# Patient Record
Sex: Female | Born: 1935
Health system: Southern US, Community
[De-identification: ages and names within clinical notes are randomized; demographics above are authoritative.]

## PROBLEM LIST (undated history)

## (undated) DIAGNOSIS — E785 Hyperlipidemia, unspecified: Secondary | ICD-10-CM

## (undated) DIAGNOSIS — K5732 Diverticulitis of large intestine without perforation or abscess without bleeding: Secondary | ICD-10-CM

## (undated) DIAGNOSIS — M109 Gout, unspecified: Secondary | ICD-10-CM

## (undated) DIAGNOSIS — I4891 Unspecified atrial fibrillation: Secondary | ICD-10-CM

## (undated) DIAGNOSIS — K219 Gastro-esophageal reflux disease without esophagitis: Secondary | ICD-10-CM

## (undated) DIAGNOSIS — K625 Hemorrhage of anus and rectum: Secondary | ICD-10-CM

## (undated) DIAGNOSIS — I2699 Other pulmonary embolism without acute cor pulmonale: Secondary | ICD-10-CM

## (undated) DIAGNOSIS — C539 Malignant neoplasm of cervix uteri, unspecified: Secondary | ICD-10-CM

## (undated) DIAGNOSIS — I10 Essential (primary) hypertension: Secondary | ICD-10-CM

## (undated) HISTORY — PX: ABDOMINAL HYSTERECTOMY: SHX81

## (undated) HISTORY — PX: BACK SURGERY: SHX140

---

## 1997-12-26 ENCOUNTER — Encounter: Payer: Self-pay | Admitting: Cardiology

## 1997-12-26 ENCOUNTER — Inpatient Hospital Stay (HOSPITAL_COMMUNITY): Admission: EM | Admit: 1997-12-26 | Discharge: 1997-12-28 | Payer: Self-pay | Admitting: Emergency Medicine

## 2000-08-06 ENCOUNTER — Ambulatory Visit (HOSPITAL_COMMUNITY): Admission: RE | Admit: 2000-08-06 | Discharge: 2000-08-06 | Payer: Self-pay | Admitting: Family Medicine

## 2000-08-06 ENCOUNTER — Encounter: Payer: Self-pay | Admitting: Family Medicine

## 2000-09-29 ENCOUNTER — Ambulatory Visit: Admission: RE | Admit: 2000-09-29 | Discharge: 2000-09-29 | Payer: Self-pay | Admitting: Family Medicine

## 2000-10-08 ENCOUNTER — Ambulatory Visit (HOSPITAL_COMMUNITY): Admission: RE | Admit: 2000-10-08 | Discharge: 2000-10-08 | Payer: Self-pay | Admitting: Family Medicine

## 2000-10-08 ENCOUNTER — Encounter: Payer: Self-pay | Admitting: Family Medicine

## 2000-10-29 ENCOUNTER — Encounter: Admission: RE | Admit: 2000-10-29 | Discharge: 2001-01-27 | Payer: Self-pay | Admitting: Internal Medicine

## 2000-10-29 ENCOUNTER — Encounter (HOSPITAL_BASED_OUTPATIENT_CLINIC_OR_DEPARTMENT_OTHER): Payer: Self-pay | Admitting: Internal Medicine

## 2000-12-07 ENCOUNTER — Ambulatory Visit (HOSPITAL_COMMUNITY): Admission: RE | Admit: 2000-12-07 | Discharge: 2000-12-07 | Payer: Self-pay | Admitting: Gastroenterology

## 2000-12-07 ENCOUNTER — Encounter (INDEPENDENT_AMBULATORY_CARE_PROVIDER_SITE_OTHER): Payer: Self-pay | Admitting: Specialist

## 2003-02-01 ENCOUNTER — Encounter: Admission: RE | Admit: 2003-02-01 | Discharge: 2003-02-01 | Payer: Self-pay | Admitting: Internal Medicine

## 2003-05-15 ENCOUNTER — Emergency Department (HOSPITAL_COMMUNITY): Admission: EM | Admit: 2003-05-15 | Discharge: 2003-05-15 | Payer: Self-pay | Admitting: Emergency Medicine

## 2003-12-07 ENCOUNTER — Emergency Department (HOSPITAL_COMMUNITY): Admission: EM | Admit: 2003-12-07 | Discharge: 2003-12-07 | Payer: Self-pay | Admitting: Emergency Medicine

## 2004-08-29 ENCOUNTER — Encounter: Admission: RE | Admit: 2004-08-29 | Discharge: 2004-08-29 | Payer: Self-pay | Admitting: Internal Medicine

## 2005-01-06 ENCOUNTER — Ambulatory Visit (HOSPITAL_COMMUNITY): Admission: RE | Admit: 2005-01-06 | Discharge: 2005-01-06 | Payer: Self-pay | Admitting: Gastroenterology

## 2005-01-06 ENCOUNTER — Encounter: Admission: RE | Admit: 2005-01-06 | Discharge: 2005-01-06 | Payer: Self-pay | Admitting: Gastroenterology

## 2006-08-22 ENCOUNTER — Emergency Department (HOSPITAL_COMMUNITY): Admission: EM | Admit: 2006-08-22 | Discharge: 2006-08-23 | Payer: Self-pay | Admitting: Emergency Medicine

## 2007-02-20 ENCOUNTER — Emergency Department (HOSPITAL_COMMUNITY): Admission: EM | Admit: 2007-02-20 | Discharge: 2007-02-20 | Payer: Self-pay | Admitting: Emergency Medicine

## 2007-10-11 ENCOUNTER — Encounter: Admission: RE | Admit: 2007-10-11 | Discharge: 2007-10-11 | Payer: Self-pay | Admitting: Gastroenterology

## 2008-09-26 ENCOUNTER — Inpatient Hospital Stay (HOSPITAL_COMMUNITY): Admission: EM | Admit: 2008-09-26 | Discharge: 2008-09-27 | Payer: Self-pay | Admitting: Emergency Medicine

## 2009-10-28 ENCOUNTER — Inpatient Hospital Stay (HOSPITAL_COMMUNITY): Admission: EM | Admit: 2009-10-28 | Discharge: 2009-10-31 | Payer: Self-pay | Admitting: Emergency Medicine

## 2010-01-21 ENCOUNTER — Encounter: Admission: RE | Admit: 2010-01-21 | Discharge: 2010-01-21 | Payer: Self-pay | Admitting: Gastroenterology

## 2010-05-09 LAB — HEMOGLOBIN AND HEMATOCRIT, BLOOD
HCT: 31.9 % — ABNORMAL LOW (ref 36.0–46.0)
HCT: 34.4 % — ABNORMAL LOW (ref 36.0–46.0)
HCT: 35.2 % — ABNORMAL LOW (ref 36.0–46.0)
Hemoglobin: 11 g/dL — ABNORMAL LOW (ref 12.0–15.0)
Hemoglobin: 11.7 g/dL — ABNORMAL LOW (ref 12.0–15.0)
Hemoglobin: 11.9 g/dL — ABNORMAL LOW (ref 12.0–15.0)

## 2010-05-09 LAB — DIFFERENTIAL
Basophils Absolute: 0 10*3/uL (ref 0.0–0.1)
Basophils Absolute: 0 10*3/uL (ref 0.0–0.1)
Basophils Relative: 0 % (ref 0–1)
Eosinophils Absolute: 0.1 10*3/uL (ref 0.0–0.7)
Lymphocytes Relative: 22 % (ref 12–46)
Lymphs Abs: 0.9 10*3/uL (ref 0.7–4.0)
Neutro Abs: 1.8 10*3/uL (ref 1.7–7.7)
Neutrophils Relative %: 61 % (ref 43–77)

## 2010-05-09 LAB — URINALYSIS, ROUTINE W REFLEX MICROSCOPIC
Bilirubin Urine: NEGATIVE
Hgb urine dipstick: NEGATIVE
Nitrite: NEGATIVE
Specific Gravity, Urine: 1.014 (ref 1.005–1.030)
pH: 6 (ref 5.0–8.0)

## 2010-05-09 LAB — CROSSMATCH

## 2010-05-09 LAB — BASIC METABOLIC PANEL
BUN: 15 mg/dL (ref 6–23)
BUN: 4 mg/dL — ABNORMAL LOW (ref 6–23)
Chloride: 110 mEq/L (ref 96–112)
Chloride: 111 mEq/L (ref 96–112)
Creatinine, Ser: 0.87 mg/dL (ref 0.4–1.2)
Creatinine, Ser: 0.88 mg/dL (ref 0.4–1.2)
GFR calc non Af Amer: >60 mL/min (ref >60)
GFR calc non Af Amer: >60 mL/min (ref >60)
Glucose, Bld: 86 mg/dL (ref 70–99)
Glucose, Bld: 88 mg/dL (ref 70–99)
Potassium: 3.5 mEq/L (ref 3.5–5.1)
Potassium: 4 mEq/L (ref 3.5–5.1)
Potassium: 4 mEq/L (ref 3.5–5.1)
Sodium: 142 mEq/L (ref 135–145)

## 2010-05-09 LAB — CBC
HCT: 23.4 % — ABNORMAL LOW (ref 36.0–46.0)
HCT: 29 % — ABNORMAL LOW (ref 36.0–46.0)
HCT: 32.5 % — ABNORMAL LOW (ref 36.0–46.0)
Hemoglobin: 8 g/dL — ABNORMAL LOW (ref 12.0–15.0)
Hemoglobin: 9.6 g/dL — ABNORMAL LOW (ref 12.0–15.0)
MCH: 31.1 pg (ref 26.0–34.0)
MCHC: 33.1 g/dL (ref 30.0–36.0)
MCHC: 34.4 g/dL (ref 30.0–36.0)
MCV: 90.5 fL (ref 78.0–100.0)
Platelets: 117 10*3/uL — ABNORMAL LOW (ref 150–400)
Platelets: 126 10*3/uL — ABNORMAL LOW (ref 150–400)
RBC: 2.58 MIL/uL — ABNORMAL LOW (ref 3.87–5.11)
RDW: 16 % — ABNORMAL HIGH (ref 11.5–15.5)
RDW: 16.2 % — ABNORMAL HIGH (ref 11.5–15.5)
WBC: 2.5 10*3/uL — ABNORMAL LOW (ref 4.0–10.5)
WBC: 3 10*3/uL — ABNORMAL LOW (ref 4.0–10.5)

## 2010-05-09 LAB — PROTIME-INR
INR: 0.99 (ref 0.00–1.49)
Prothrombin Time: 13.3 seconds (ref 11.6–15.2)

## 2010-05-09 LAB — COMPREHENSIVE METABOLIC PANEL
ALT: 13 U/L (ref 0–35)
Alkaline Phosphatase: 33 U/L — ABNORMAL LOW (ref 39–117)
CO2: 24 mEq/L (ref 19–32)
Calcium: 9.5 mg/dL (ref 8.4–10.5)
Chloride: 114 mEq/L — ABNORMAL HIGH (ref 96–112)
GFR calc non Af Amer: 50 mL/min — ABNORMAL LOW (ref >60)
Glucose, Bld: 93 mg/dL (ref 70–99)
Sodium: 141 mEq/L (ref 135–145)
Total Bilirubin: 0.7 mg/dL (ref 0.3–1.2)

## 2010-05-09 LAB — PREPARE FRESH FROZEN PLASMA

## 2010-05-09 LAB — URINE CULTURE

## 2010-05-09 LAB — ABO/RH: ABO/RH(D): O POS

## 2010-05-09 LAB — RETICULOCYTES
RBC.: 2.88 MIL/uL — ABNORMAL LOW (ref 3.87–5.11)
Retic Count, Absolute: 51.8 10*3/uL (ref 19.0–186.0)
Retic Ct Pct: 1.8 % (ref 0.4–3.1)

## 2010-05-09 LAB — FOLATE: Folate: >20 ng/mL

## 2010-05-09 LAB — URINE MICROSCOPIC-ADD ON

## 2010-05-09 LAB — IRON AND TIBC
Iron: 41 ug/dL — ABNORMAL LOW (ref 42–135)
Saturation Ratios: 13 % — ABNORMAL LOW (ref 20–55)
TIBC: 308 ug/dL (ref 250–470)
UIBC: 267 ug/dL

## 2010-05-09 LAB — FERRITIN: Ferritin: 46 ng/mL (ref 10–291)

## 2010-06-01 LAB — COMPREHENSIVE METABOLIC PANEL
BUN: 10 mg/dL (ref 6–23)
CO2: 25 mEq/L (ref 19–32)
Calcium: 9 mg/dL (ref 8.4–10.5)
Creatinine, Ser: 0.81 mg/dL (ref 0.4–1.2)
GFR calc non Af Amer: >60 mL/min (ref >60)
Glucose, Bld: 90 mg/dL (ref 70–99)
Total Protein: 5.6 g/dL — ABNORMAL LOW (ref 6.0–8.3)

## 2010-06-01 LAB — DIFFERENTIAL
Eosinophils Relative: 3 % (ref 0–5)
Lymphocytes Relative: 21 % (ref 12–46)
Lymphs Abs: 0.8 10*3/uL (ref 0.7–4.0)
Monocytes Absolute: 0.3 10*3/uL (ref 0.1–1.0)

## 2010-06-01 LAB — CBC
HCT: 38.7 % (ref 36.0–46.0)
Hemoglobin: 12.8 g/dL (ref 12.0–15.0)
Hemoglobin: 12.9 g/dL (ref 12.0–15.0)
MCHC: 33.3 g/dL (ref 30.0–36.0)
MCV: 90.8 fL (ref 78.0–100.0)
RBC: 4.22 MIL/uL (ref 3.87–5.11)
RBC: 4.28 MIL/uL (ref 3.87–5.11)
RDW: 15.5 % (ref 11.5–15.5)
WBC: 3.7 10*3/uL — ABNORMAL LOW (ref 4.0–10.5)

## 2010-06-01 LAB — CARDIAC PANEL(CRET KIN+CKTOT+MB+TROPI)
CK, MB: 1.3 ng/mL (ref 0.3–4.0)
CK, MB: 1.3 ng/mL (ref 0.3–4.0)
Relative Index: INVALID (ref 0.0–2.5)
Total CK: 67 U/L (ref 7–177)
Total CK: 85 U/L (ref 7–177)
Troponin I: 0.01 ng/mL (ref 0.00–0.06)
Troponin I: 0.01 ng/mL (ref 0.00–0.06)
Troponin I: 0.01 ng/mL (ref 0.00–0.06)

## 2010-06-01 LAB — URINALYSIS, ROUTINE W REFLEX MICROSCOPIC
Glucose, UA: NEGATIVE mg/dL
Ketones, ur: NEGATIVE mg/dL
Leukocytes, UA: NEGATIVE
Protein, ur: NEGATIVE mg/dL
Urobilinogen, UA: 0.2 mg/dL (ref 0.0–1.0)

## 2010-06-01 LAB — LIPID PANEL
LDL Cholesterol: 104 mg/dL — ABNORMAL HIGH (ref 0–99)
Total CHOL/HDL Ratio: 3.2 RATIO
VLDL: 14 mg/dL (ref 0–40)

## 2010-06-01 LAB — POCT I-STAT, CHEM 8
BUN: 16 mg/dL (ref 6–23)
Chloride: 110 mEq/L (ref 96–112)
Sodium: 142 mEq/L (ref 135–145)
TCO2: 22 mmol/L (ref 0–100)

## 2010-06-01 LAB — BASIC METABOLIC PANEL
CO2: 26 mEq/L (ref 19–32)
Calcium: 9.4 mg/dL (ref 8.4–10.5)
Chloride: 110 mEq/L (ref 96–112)
GFR calc Af Amer: >60 mL/min (ref >60)
Glucose, Bld: 90 mg/dL (ref 70–99)
Sodium: 142 mEq/L (ref 135–145)

## 2010-06-01 LAB — HEMOGLOBIN A1C
Hgb A1c MFr Bld: 5.4 % (ref 4.6–6.1)
Mean Plasma Glucose: 108 mg/dL

## 2010-06-01 LAB — PROTIME-INR
INR: 1.6 — ABNORMAL HIGH (ref 0.00–1.49)
INR: 1.6 — ABNORMAL HIGH (ref 0.00–1.49)
Prothrombin Time: 18.9 seconds — ABNORMAL HIGH (ref 11.6–15.2)
Prothrombin Time: 19.6 seconds — ABNORMAL HIGH (ref 11.6–15.2)
Prothrombin Time: 19.8 seconds — ABNORMAL HIGH (ref 11.6–15.2)

## 2010-06-01 LAB — POCT CARDIAC MARKERS
Myoglobin, poc: 49.5 ng/mL (ref 12–200)
Troponin i, poc: <0.05 ng/mL (ref 0.00–0.09)

## 2010-06-01 LAB — URINE CULTURE

## 2010-06-01 LAB — URINE MICROSCOPIC-ADD ON

## 2010-06-01 LAB — MAGNESIUM: Magnesium: 2.1 mg/dL (ref 1.5–2.5)

## 2010-07-09 NOTE — Cardiovascular Report (Signed)
[name], [name]                ACCOUNT NO.:  [id number]   MEDICAL RECORD NO.:  [id number]          PATIENT TYPE:  INP   LOCATION:  3733                         FACILITY:  [facility]   PHYSICIAN:  Richard A. Alanda Amass, M.D.DATE OF BIRTH:  [birth year]   DATE OF PROCEDURE:  09/26/2008  DATE OF DISCHARGE:                            CARDIAC CATHETERIZATION   PROCEDURES:  Retrograde central aortic catheterization; selective  coronary angiography; pre and post IC nitroglycerin administration; LV  angiogram, RAO and LAO projection; abdominal aortic angiogram, midstream  PA projection; successful StarClose right common femoral artery closure  device deployed.   BRIEF HISTORY:  [name] is a 77 year old Afro American widowed  mother of 4 with 62 GC, 1 GGC retired from [employer].  Chronic cigarette  abuse, currently a half-a-pack per day.  History of sick sinus syndrome  with PAF, treated with beta-blockers and chronic Coumadin therapy under  the previous care of Dr. Chanda Busing.  Negative Cardiolite for  ischemia in 2008.  History of intermittent chest pain in the past and  remote normal coronary arteries over 10 years ago by Dr. Deborah Chalk.  Admitted to the hospital in the a.m. of September 26, 2008 for lower  substernal chest pain, pressure-like with radiation to the left  shoulder.  No nausea, vomiting, or diaphoresis.  No significant reflux  symptoms.  Myocardial infarction ruled out by serial enzymes and EKGs.  The patient has had PAF in the hospital.  Coumadin was not taken by the  patient yesterday and it was held today and INR was 1.6.  Informed  consent was obtained to proceed with diagnostic coronary angiography in  this setting.  There are associated history of hyperlipidemia not  treated, systemic hypertension, and GERD, remote history of  diverticulitis, irregular BM.   The patient was brought to the second floor CP Lab in a postabsorptive  state after premedication with 5 mg of  Valium p.o.  Right groin was  prepped, draped in usual manner.  Xylocaine 1% was used for local  anesthesia.  The RCFA was entered with single anterior puncture using an  18 thin-wall needle and using modified Seldinger technique, 5-French  short sidearm sheath was inserted without difficulty.  Diagnostic  coronary angiography was done with 5-French Cordis preformed coronary  and pigtail catheters.  IC nitroglycerin was given at the left coronary  with repeat injections obtained.  LV angiogram was done at 25 mL, 14 mL  per second RAO and 20 mL/12 mL per second LAO projection.  Pullback  pressure of the CA was performed and showed no gradient across the  aortic valve.  Catheter was pulled down above the level of the renal  arteries and in the oblique projection, abdominal aortic angiography was  done because of the patient's known systemic hypertension at 20 mL, 12  mL per second through the pigtail catheter.  Catheter was removed.  Puncture was verified by hand injection through the sidearm sheath  should be in the RCFA.  There were irregularities, but no calcification  seen.  The right common femoral arteriotomy was closed with a  5-6 [place]  StarClose device successfully.  The patient was transferred to the  holding area for postoperative care and observation.   PRESSURES:  LV:  150/0; LVEDP 14 mmHg.   CA:  150/70 mmHg, post IC nitroglycerin.  (Systolic blood pressure was  160 pre IC nitroglycerin and came down to 135 at the end of the  procedure).  There was no gradient across the aortic valve.   Fluoroscopy did not reveal any significant coronary intracardiac or  valvular calcification.   LV angiogram in the RAO and LAO projection showed a vigorous  hyperdynamic LV with near cavity obliteration of the apex.  No segmental  wall motion abnormality.  Mitral valve prolapse was seen  angiographically, but there was no mitral regurgitation present.  The EF  was greater than 70%.   The  main left coronary was normal.   The LAD coursed to the apex and undersurface of the heart and wrapped  around the apex.  There was 30-40% eccentric narrowing irregularity at  the junction of the proximal third at SP2 and DX1.  There was no other  segmental area of irregularity at the junction of the mid and distal  third with 30-40% narrowing or less distal large vessel, however, with  excellent residual lumen and normal flow.   There was a large DX1 from the proximal third af SP1 that trifurcated  and had no significant stenosis.   The circumflex was nondominant, gave off a small normal OM-1, small  normal OM-2.  The AV groove circumflex had segmental 30% narrowing  irregularity, atherosclerotic in nature, but no significant stenosis and  bifurcated distally.  The atrial and PABG branch were normal from the  proximal third of the OCX.   The right coronary was a large dominant vessel.  There are  irregularities throughout the midportion with 20-30% narrowing and  another 30-40% narrowing before the acute margin.  There was bifurcating  large PDA and normal bifurcating PLA and normal RV branches.  Normal  flow.   Abdominal angiogram demonstrated patent SMA and celiac axis and widely  patent single renal artery bilaterally with mildly tortuous infrarenal  abdominal aorta with no aneurysm or stenosis.   DISCUSSION:  The patient's history as outlined above.  Chest pain is  probably noncardiac since she has noncritical coronary artery disease  with atherosclerotic irregularities and mild narrowing as outlined  above.   I would recommend discontinuation of smoking.  Because of her  hyperdynamic LV, I would avoid any nitrite therapy and make sure she is  well hydrated.  She may be a candidate for increase in her beta-blockers  particularly with her history of PAF.  She is also a possible candidate  for antiarrhythmic therapy because of PAF, although she will remain on  chronic  Coumadin with plan to restart Coumadin tonight if the right  groin is stable, postprocedure.  Because of her long-term chronic  Coumadin therapy and catheterization today, I did not feel that we  needed to restart heparin across or over, but rather restart Coumadin  alone.   The patient will be followed up as an outpatient.   CATHETERIZATION DIAGNOSES:  1. Chest pain, etiology not determined.  2. Sick sinus syndrome, paroxysmal atrial fibrillation on beta-      blocker.  3. Hyperlipidemia.  4. Adult-onset diabetes mellitus.  5. Systemic hypertension, mild normal renal arteries.  6. Hyperdynamic left ventricle with mitral valve prolapse without      mitral regurgitation.  7.  Hyperlipidemia.  8. Cigarette abuse (smoking cessation consult done and discontinuation      consult).      Richard A. Alanda Amass, M.D.  Electronically Signed     RAW/MEDQ  D:  09/26/2008  T:  09/27/2008  Job:  [id number]   cc:   CP Lab  Robyn N. Allyne Gee, M.D.

## 2010-07-12 NOTE — Op Note (Signed)
[name], Sara Butler                ACCOUNT NO.:  [id number]   MEDICAL RECORD NO.:  [id number]          PATIENT TYPE:  AMB   LOCATION:  ENDO                         FACILITY:  [facility]   PHYSICIAN:  Petra Kuba, M.D.    DATE OF BIRTH:  1928-09-20   DATE OF PROCEDURE:  01/06/2005  DATE OF DISCHARGE:                                 OPERATIVE REPORT   PROCEDURE PERFORMED:  Flexible sigmoidoscopy.   ENDOSCOPIST:  Petra Kuba, M.D.   INDICATIONS FOR PROCEDURE:  Bright red blood per rectum.  History of colon  polyps, history of diverticular stricture, diarrhea.  Consent was signed  after the risks and benefits were thoroughly discussed in the office  multiple times.   MEDICINES USED:  Demerol 70 mg, Versed 5 mg.   DESCRIPTION OF PROCEDURE:  Rectal inspection was pertinent for external  hemorrhoids small.  Digital exam was negative.  The upper endoscope was  inserted but unfortunately at this time, we were unable to advance through  the stricture at 20 cm.  She had some trouble holding air and we did try to  compress her rectum.  We also turned her on her back but despite multiple  attempts, could not advance through this strictured area.  We elected to  withdraw. The rectum was normal.  Retroflexion and anorectal pullthrough  revealed some small hemorrhoids.  Air was suctioned, scope removed.  The  patient tolerated the procedure well.  There were no obvious complications.   ENDOSCOPIC DIAGNOSES:  1.  Internal and external hemorrhoids.  2.  Distal sigmoid stricture, unable to advance the upper 140 scope.  We did      not have unfortunately the 160 available to try which is what we were      able to advance based on my records the last time with attempts on both      her left side and her back.   PLAN:  Go ahead and get a barium enema today.  Consider virtual colonoscopy.  Surgical consultation, possibly even a balloon dilatation and retrial, but  will wait on barium enema to decide.   Probably have to discuss her case  further with Dr. Allyne Gee.  Okay to resume Coumadin and wait on the x-ray  report to decide how to proceed.           ______________________________  Petra Kuba, M.D.     MEM/MEDQ  D:  01/06/2005  T:  01/07/2005  Job:  [id number]   cc:   Candyce Churn. Allyne Gee, M.D.  Fax: [phone]

## 2010-07-12 NOTE — Consult Note (Signed)
[facility]  Patient:    DEMITRA, Sara Butler Visit Number: [id number] MRN: [id number]          Service Type: FTC Location: FOOT Attending Physician:  Sharren Bridge Dictated by:   Jonelle Sports Cheryll Cockayne, M.D. Proc. Date: 10/30/00 Admit Date:  10/29/2000                            Consultation Report  HISTORY:  This 77 year old white female is referred by the [facility] physicians for evaluation of painful swelling of the distal left lower extremity.  Apparently, these symptoms began some two months ago and had been pretty persistent since that time.  The patient was unaware of any antecedent illness nor has she had any substantial injury to that extremity of which she is aware.  She has not had any previous episodes quite like this, although she has been prone to degenerative arthritis and most recently has been on Celebrex.  The pains began more in the ankle area and would shoot up into the calf to the knee and just above and had continued on that basis.  They are not continuous pains and not triggered by any specific positioning or activity. She has noted tenderness about the ankle, perhaps particularly on the medial side.  She has had no fever or systemic symptoms.  Her other joints are relatively stable and quiet at the moment.  During her evaluation for this at the [facility], she has had a normal CBC, normal CMET, normal CEA 125, and negative venous Dopplers for thrombophlebitis or Bakers cyst on that side and has had evidence for modest lymph node enlargement of the left groin.  This latter led to a CT of the pelvis, which showed an absence of the uterus with mucosal thickening of the distal ileum and in the left colon, suggestive perhaps of colitis.  In addition, it did confirm one moderately enlarged lymph node at the left groin measuring 19 x 15 mm and one or two smaller nodes that were essentially unremarkable.  With that  background history, the patient is referred here for our evaluation of her ankle and lower extremity pain.  PAST MEDICAL HISTORY:  Chronic reflux esophagitis, positive H. pylori in June 2002, which apparently was treated.  She had endocervical cancer, for which she had a hysterectomy 28 years ago followed by some radiation and chemotherapy but with no recurrence.  She has had a back surgery in 1993. Apparently had cardiac catheterization in 1999 but was told of no coronary disease.  She is hypertensive and takes medication for that and does have degenerative arthritis, as mentioned above.  ALLERGIES:  CODEINE.  REGULAR MEDICATIONS: 1. Atenolol 50 mg daily. 2. Famotidine 20 mg daily. 3. Hydrochlorothiazide 25 mg daily. 4. Quinine 260 mg daily. 5. Nexium 40 mg daily. 6. Fioricet generic 1 every four hours as needed for pain. 7. Celebrex 200 mg 1 daily. 8. Recently had a course of cephalexin 500 mg q.i.d. for seven days without    particular change noted in that left lower extremity.  This was completed    some five or six days ago.  PHYSICAL EXAMINATION:  EXTREMITIES:  Examination today is limited to the lower extremities.  It is noted that both extremities show moderate stasis dermatitis-type changes in the lower pretibial and ankle areas bilaterally but there is no apparent skin breakdown.  All pulses are palpable and generous.  Skin temperatures are normal but  with a 2-degree differential on both the plantar aspect and the dorsum of the left foot compared to the right and this differential extends approximately one-third the way up the calf.  Monofilament testing shows that she has protected sensation throughout her feet.  There are no gross abnormalities in the feet.  No areas of broken skin, to include a careful search of the interdigital area, and no significant callus formation.  There is slight tenderness to palpation over the ankle area and tibiotalar area on the left but  without gross edema.  Careful measurement is taken on the left lower extremity and at 0.10 cm below the tibial plateau circumference is 36.1 cm.  This is 35 at a corresponding area on the right.  An area 10 cm proximal to the medial malleolus, the measurement is 20 cm on the left and 21.8 cm on the right, which shows actually that the unaffected left is slightly larger than the affected leg at this level.  Homans sign is negative.  There is no obvious cellulitis.  The small nodes at the groin cannot easily be palpated and there is no other gross abnormality to that extremity.  The degree of edema, as indicated, is extremely mild and is not pitting in nature.  IMPRESSION:  Probable inflammatory arthritic process at the left ankle with secondary erythema, swelling, and painful symptomatology.  DISPOSITION: 1. With the thought in mind that this could be atypical gout, the patient will    be sent for uric acid determination.  At the same time, sedimentation rate    and ANA will be done. 2. The left ankle will be x-rayed to exclude any subtle pathology beyond    degenerative arthritis. 3. She is to be allowed to continue all of her usual medications. 4. Because of the evidence for venous disease and the inflammation of this    extremity, it is recommended that she add one coated aspirin per day to her    medications. 5. She is given today a double-strength prednisone 10-day Dosepak to be    tapered over the next 10 days. 6. She is advised to limit her physical activity and to be off her feet as    much as possible, during that period of time keeping her leg extended and    preferably by reclining and not with a bend at the groin crease. 7. If the evaluation indicated here is unrevealing and if the patient fails to    improve on the medication given, it is suggested that perhaps a    consultation with the internal medicine clinic would be more appropriate    than a return here. 8.  Incidentally, the patient does have a scheduled appointment with a    gastroenterologist to evaluate the possibility of inflammatory disease of     the distal colon and ileum by a gastroenterologist. 9. Any follow-up here should be on a p.r.n. basis. Dictated by:   Jonelle Sports Cheryll Cockayne, M.D. Attending Physician:  Sharren Bridge DD:  10/29/00 TD:  10/29/00 Job: [id number]

## 2010-07-12 NOTE — Discharge Summary (Signed)
Sara Butler, KURDZIEL NO.:  [id number]   MEDICAL RECORD NO.:  [id number]          PATIENT TYPE:  INP   LOCATION:  3733                         FACILITY:  [facility]   PHYSICIAN:  Sheliah Mends, MD      DATE OF BIRTH:  1928-09-07   DATE OF ADMISSION:  09/26/2008  DATE OF DISCHARGE:  09/27/2008                               DISCHARGE SUMMARY   DISCHARGE DIAGNOSES:  1. Chest pain, noncardiac.      a.     Cardiac catheterization with nonobstructive coronary artery       disease.  2. Paroxysmal atrial fibrillation, initially in atrial fibrillation on      admission, but converted during hospitalization in sinus rhythm to      sinus bradycardia at discharge.  3. Urinary tract infection.  4. Hyperdynamic left ventricular dysfunction.  5. History of back surgery.  6. History of uterine cancer with hysterectomy in 1975 with 1 month of      radiation.  7. History of diverticulitis.   DISCHARGE CONDITION:  Improved.   PROCEDURES:  September 26, 2008, combined left heart catheterization by Dr.  Susa Griffins.   DISCHARGE MEDICATIONS:  1. Atenolol 50 mg has been increased to 75 mg daily, i.e. one and a      half 50-mg tablet daily.  2. Magnesium oxide was stopped.  3. Nitroglycerin sublingual as needed as before.  4. Coumadin 10 mg September 27, 2008, then 5 mg September 28, 2008, and then      Coumadin Clinic will continue dosing.  5. Lidoderm patch 5% to area daily if needed as before.  6. Vitamin D 50,000 units Tuesdays and Fridays as before.  7. Cipro 250 mg one twice a day for 3 days and stop.   DISCHARGE INSTRUCTIONS:  1. Increase activity slowly.  2. May shower.  3. No lifting for 1 week.  4. No driving for 2 days.  5. Low-sodium heart-healthy diet.  6. Wash cath site with soap and water.  7. Call if any bleeding, swelling, or drainage.  8. Follow up with Dr. Alanda Amass on October 16, 2008, at 4:15 p.m.  9. Go to Coumadin Clinic at [facility] on  September 29, 2008, at 12 noon.   HOSPITAL COURSE:  The patient was admitted on September 26, 2008, secondary  to chest pain and shortness of breath.  She was found to be in atrial  fibrillation.  History includes remote cath 10 years ago by Dr. Deborah Chalk.  Other history, Persantine Myoview in 2008 with ischemia, EF 70%; and 2D  echo in June 2006, EF greater than 55% with mild LVH and trace MR.  The  patient was admitted by Dr. Alanda Amass, continued on her Coumadin and her  atenolol was increased to 75 mg daily; and she underwent cardiac  catheterization to evaluate her coronary arteries.  Her INR was 1.7 at  that time.  She did well with cardiac catheterization; and by the next  morning, INR was still 1.6.  Dr. Alanda Amass did not feel she needed to  be  crossed over as she was maintaining sinus rhythm now.  She would receive  10 mg of Coumadin on the day of discharge.  She was found to have UTI  and was placed on Cipro.   Discharge blood pressure 124/76, pulse 56, resp is 16, temperature 98.5,  oxygen saturation on room air was 100%.   HEART:  Regular rate and rhythm.  LUNGS:  Clear to auscultation bilaterally.  ABDOMEN:  Soft and nontender.  EXTREMITIES:  Without edema.   LABORATORY DATA:  Hemoglobin 12.8, hematocrit 38.3, WBC 3.3, platelets  121, slightly decreased.  It was 141 on admission.  Neutrophils 67,  lymphs 21, mono 8, eos 3, baso 1.   Protime was 1.7 on admission.  At discharge, 1.6.   Chemistry:  Sodium 142, potassium 3.6, chloride 110, glucose 115, BUN  16, creatinine 0.81.   Total protein 5.6, albumin 2.9, AST 20, ALT 15, alkaline phos 46, total  bili 0.9, glycohemoglobin 5.4.  Cardiac enzymes were negative with CKs  85-67, MBs 1.3-1.0, and troponin I 0.01 x3.   Total cholesterol 171, triglycerides 68, HDL 53, and LDL 104.  TSH  1.660.  UA was positive for nitrites and was found to have Klebsiella  pneumonia and it was sensitive to Cipro.  The EKG revealed initially   atrial fibrillation, rate control.  No acute ST changes and follow up  September 26, 2008, continued AFib.   Chest x-ray during hospitalization showed no acute disease.  The patient  was seen and examined by Dr. Garen Lah and felt stable for discharge  home.  He will follow up as an outpatient.      Darcella Gasman. Annie Paras, N.P.      Sheliah Mends, MD  Electronically Signed    LRI/MEDQ  D:  10/22/2008  T:  10/23/2008  Job:  [id number]   cc:   Gerlene Burdock A. Alanda Amass, M.D.  Candyce Churn. Allyne Gee, M.D.

## 2010-07-12 NOTE — Procedures (Signed)
[place]  Patient:    Sara, Butler Visit Number: [id number] MRN: [id number]          Service Type: END Location: ENDO Attending Physician:  Nelda Marseille Dictated by:   Petra Kuba, M.D. Proc. Date: 12/07/00 Admit Date:  12/07/2000   CC:         Britta Mccreedy R. Audria Nine, M.D.   Procedure Report  PROCEDURE PERFORMED:  Colonoscopy with polypectomy and biopsy.  ENDOSCOPIST:  Petra Kuba, M.D.  INDICATIONS FOR PROCEDURE:  Abnormal CT scan, multiple GI complaints.  Consent was signed after risks, benefits, methods, and options were thoroughly discussed in the office.  MEDICATIONS USED:  Demerol 70 mg, Versed 7 mg.  DESCRIPTION OF PROCEDURE:  Rectal inspection was pertinent for external hemorrhoids small.  Digital exam was negative.  First the video pediatric colonoscope was inserted but unable to be advanced through the sigmoid.  At the first sigmoid turn seemed to be a stricture.  We did try to roll her on her back but that was not helpful and we elected to withdraw back to the rectum.  The scope was retroflexed revealing some internal hemorrhoids.  The scope was removed and we went ahead and inserted the upper endoscope 160 which was easily able to advance through the strictured area and with minimal abdominal pressure able to be advanced to the cecum.  There were some left distal diverticula but no other abnormalities.  We advanced to the cecum which was identified by the appendiceal orifice and the ileocecal valve.  The scope was inserted  into the terminal ileum which had a slight increase in white nodularity but no obvious inflammation.  Scattered biopsies were obtained as was photodocumentation.  The scope was slowly withdrawn.  Some random cecum, ascending and transverse biopsies were obtained which was put in a second container.  This was done because of inflammation on the CT scan.  The prep was adequate, there was some  liquid stool that required washing and suctioning.  In the midtransverse, two small polyps were seen and one was hot biopsied x 1, the other hot biopsied x 2.  The scope was further withdrawn, no other abnormalities were seen as we withdrew back to the sigmoid and on slow withdrawal through the distal sigmoid confirmed some diverticula in the distal sigmoid stricture.  There was some inflammation and erythema.  Scattered biopsies of the strictured area were obtained and put in the fourth container. The scope was slowly withdrawn back to the rectum.  No additional findings were seen.  We then advanced through the strictured area and suctioned air from the left side of the colon.  Scope was removed.  The patient tolerated the procedure well.  There was no obvious immediate complication.  ENDOSCOPIC DIAGNOSIS: 1. Internal and external small hemorrhoids. 2. Distal sigmoid stricture probably diverticula, unable to advance the    pediatric colonoscope but able to advance the upper endoscope, status post    biopsy of the stricture. 3. Left distal diverticula only. 4. Two small transverse polyps hot biopsied. 5. Otherwise within normal limits to the terminal ileum, except for slight    abnormal mucosa, status post random biopsies of the terminal ileum and    the colon on the right side. PLAN:  Await pathology.  Call p.r.n.  Otherwise follow up in one to two months to recheck symptoms and decide any other work-up and plans. Dictated by:   Petra Kuba, M.D. Attending Physician:  Nelda Marseille DD:  12/07/00 TD:  12/07/00 Job: [id number]

## 2010-11-29 LAB — DIFFERENTIAL
Basophils Absolute: 0
Eosinophils Absolute: 0.1
Eosinophils Relative: 2
Lymphocytes Relative: 19
Lymphs Abs: 0.8
Monocytes Absolute: 0.4

## 2010-11-29 LAB — URINALYSIS, ROUTINE W REFLEX MICROSCOPIC
Bilirubin Urine: NEGATIVE
Glucose, UA: NEGATIVE
Hgb urine dipstick: NEGATIVE
Specific Gravity, Urine: 1.015
Urobilinogen, UA: 0.2
pH: 6

## 2010-11-29 LAB — COMPREHENSIVE METABOLIC PANEL
ALT: 15
AST: 17
Albumin: 3.1 — ABNORMAL LOW
Alkaline Phosphatase: 54
BUN: 6
CO2: 26
Calcium: 9.5
Chloride: 111
Creatinine, Ser: 0.91
GFR calc Af Amer: >60
GFR calc non Af Amer: >60
Glucose, Bld: 97
Potassium: 4.3
Sodium: 141
Total Bilirubin: 0.8
Total Protein: 6

## 2010-11-29 LAB — URINE MICROSCOPIC-ADD ON

## 2010-11-29 LAB — CBC
MCV: 88.2
Platelets: 166
RBC: 4.29
WBC: 4.4

## 2010-12-11 LAB — URINE MICROSCOPIC-ADD ON

## 2010-12-11 LAB — COMPREHENSIVE METABOLIC PANEL
Albumin: 3.4 — ABNORMAL LOW
Alkaline Phosphatase: 51
BUN: 7
CO2: 26
Chloride: 108
Glucose, Bld: 97
Potassium: 4.2
Total Bilirubin: 0.6

## 2010-12-11 LAB — URINALYSIS, ROUTINE W REFLEX MICROSCOPIC
Bilirubin Urine: NEGATIVE
Glucose, UA: NEGATIVE
Ketones, ur: NEGATIVE
pH: 6.5

## 2010-12-11 LAB — DIFFERENTIAL
Basophils Absolute: 0
Basophils Relative: 0
Monocytes Absolute: 0.5
Neutro Abs: 2.9
Neutrophils Relative %: 63

## 2010-12-11 LAB — CBC
HCT: 36.4
Hemoglobin: 11.8 — ABNORMAL LOW
RBC: 4.49
WBC: 4.7

## 2010-12-19 ENCOUNTER — Other Ambulatory Visit: Payer: Self-pay | Admitting: Family Medicine

## 2011-01-10 ENCOUNTER — Emergency Department (HOSPITAL_COMMUNITY)
Admission: EM | Admit: 2011-01-10 | Discharge: 2011-01-10 | Disposition: A | Discharge status: Home / Self Care | Payer: Medicare HMO | Attending: Emergency Medicine | Admitting: Emergency Medicine

## 2011-01-10 ENCOUNTER — Emergency Department (HOSPITAL_COMMUNITY): Payer: Medicare HMO

## 2011-01-10 ENCOUNTER — Encounter: Payer: Self-pay | Admitting: Emergency Medicine

## 2011-01-10 DIAGNOSIS — M899 Disorder of bone, unspecified: Secondary | ICD-10-CM | POA: Insufficient documentation

## 2011-01-10 DIAGNOSIS — I10 Essential (primary) hypertension: Secondary | ICD-10-CM | POA: Insufficient documentation

## 2011-01-10 DIAGNOSIS — Z79899 Other long term (current) drug therapy: Secondary | ICD-10-CM | POA: Insufficient documentation

## 2011-01-10 DIAGNOSIS — M7989 Other specified soft tissue disorders: Secondary | ICD-10-CM | POA: Insufficient documentation

## 2011-01-10 DIAGNOSIS — M25539 Pain in unspecified wrist: Secondary | ICD-10-CM | POA: Insufficient documentation

## 2011-01-10 DIAGNOSIS — M19039 Primary osteoarthritis, unspecified wrist: Secondary | ICD-10-CM | POA: Insufficient documentation

## 2011-01-10 DIAGNOSIS — I4891 Unspecified atrial fibrillation: Secondary | ICD-10-CM | POA: Insufficient documentation

## 2011-01-10 HISTORY — DX: Unspecified atrial fibrillation: I48.91

## 2011-01-10 HISTORY — DX: Diverticulitis of large intestine without perforation or abscess without bleeding: K57.32

## 2011-01-10 HISTORY — DX: Hemorrhage of anus and rectum: K62.5

## 2011-01-10 HISTORY — DX: Essential (primary) hypertension: I10

## 2011-01-10 MED ORDER — PREDNISONE 20 MG PO TABS: 40.0000 mg | ORAL_TABLET | Freq: Every day | ORAL | Status: AC

## 2011-01-10 MED ORDER — HYDROCODONE-ACETAMINOPHEN 5-325 MG PO TABS: 1.0000 | ORAL_TABLET | Freq: Four times a day (QID) | ORAL | Status: AC | PRN

## 2011-01-10 MED ORDER — IBUPROFEN 800 MG PO TABS
800.0000 mg | ORAL_TABLET | Freq: Once | ORAL | Status: AC
  Administered 2011-01-10: 800 mg via ORAL
  Filled 2011-01-10: qty 1

## 2011-01-10 NOTE — Progress Notes (Signed)
77 year old female comes in with several day history of pain and swelling in the left wrist. On exam, there is mild swelling of the wrist with tenderness to palpation. X-rays are negative for fracture. Overall the picture seems most consistent with gout and will be treated accordingly.

## 2011-01-10 NOTE — ED Provider Notes (Signed)
History     CSN: [id number] Arrival date & time: 01/10/2011 10:44 AM   First MD Initiated Contact with Patient 01/10/11 1052      Chief Complaint  Patient presents with  . Wrist Pain    Pt reports 18 hrs of inncreasing l/wrist pain and swelling    (Consider location/radiation/quality/duration/timing/severity/associated sxs/prior treatment) Patient is a 77 y.o. female presenting with wrist pain. The history is provided by the patient.  Wrist Pain This is a new problem. Episode onset: 2 days ago. The problem occurs constantly. The problem has been gradually worsening. Associated symptoms include joint swelling. Pertinent negatives include no abdominal pain, arthralgias, chest pain, chills, coughing, fever, headaches, nausea, neck pain, numbness, rash, vomiting or weakness. Exacerbated by: Movement, palpation. She has tried nothing for the symptoms.   patient reports that she has just finished a seven-day course of Cipro for urinary infection and was prescribed by her regular doctor. It was the second course of Cipro she has had in the last 2 months, and she is concerned that she may be developing tendinitis as a side effect of the medication.  Past Medical History  Diagnosis Date  . Hypertension   . Cancer   . Atrial fibrillation   . Bleeding from anus   . Diverticulitis of colon     Past Surgical History  Procedure Date  . Abdominal hysterectomy   . Back surgery     History reviewed. No pertinent family history.  History  Substance Use Topics  . Smoking status: Never Smoker   . Smokeless tobacco: Not on file  . Alcohol Use: No     Review of Systems  Constitutional: Negative for fever and chills.  HENT: Negative for neck pain and neck stiffness.   Eyes: Negative for pain and visual disturbance.  Respiratory: Negative for cough and chest tightness.   Cardiovascular: Negative for chest pain and palpitations.  Gastrointestinal: Negative for nausea, vomiting and  abdominal pain.  Musculoskeletal: Positive for joint swelling. Negative for back pain, arthralgias and gait problem.  Skin: Positive for color change. Negative for rash and wound.  Neurological: Negative for dizziness, weakness, numbness and headaches.  Hematological: Does not bruise/bleed easily.  Psychiatric/Behavioral: Negative for confusion.    Allergies  Codeine  Home Medications   Current Outpatient Rx  Name Route Sig Dispense Refill  . ACETAMINOPHEN 325 MG PO TABS Oral Take 325 mg by mouth every 6 (six) hours as needed. For headache     . ATENOLOL 50 MG PO TABS Oral Take 50 mg by mouth daily.      Marland Kitchen GLUCOSAMINE-CHONDROITIN 500-400 MG PO TABS Oral Take 1 tablet by mouth 3 (three) times daily. Patient states that she takes only twice a day     . LIDOCAINE 5 % EX PTCH Transdermal Place 1 patch onto the skin daily. Remove & Discard patch within 12 hours or as directed by MD     . OMEPRAZOLE 20 MG PO CPDR Oral Take 20 mg by mouth daily.      Marland Kitchen SIMVASTATIN 20 MG PO TABS Oral Take 20 mg by mouth at bedtime.        BP 188/93  Pulse 73  Temp(Src) 98.9 F (37.2 C) (Oral)  Resp 20  SpO2 100%  Physical Exam  Constitutional: She is oriented to person, place, and time. She appears well-developed and well-nourished.  HENT:  Head: Normocephalic and atraumatic.  Eyes: Pupils are equal, round, and reactive to light.  Neck: Normal range of  motion. Neck supple.  Cardiovascular: Normal rate and regular rhythm.   Pulmonary/Chest: Effort normal and breath sounds normal.  Abdominal: Soft. She exhibits no distension. There is no tenderness.  Musculoskeletal:       Pierce tenderness to palpation of the left wrist on the radial aspect. There is swelling to the area with overlying redness. The patient has full range of motion with very slow movements secondary to pain. Grip strength is intact bilaterally. Wrist flexion and extension strength 4/5 on the left 5 out of 5 on the right. No other joints  with tenderness or edema.  Neurological: She is alert and oriented to person, place, and time. No cranial nerve deficit.  Skin: Skin is warm and dry.  Psychiatric: She has a normal mood and affect.    ED Course  Procedures (including critical care time)  Labs Reviewed - No data to display Dg Wrist Complete Left  01/10/2011  *RADIOLOGY REPORT*  Clinical Data: Edema, erythema, and pain involving the left wrist. No known injuries.  LEFT WRIST - COMPLETE 3+ VIEW 01/10/2011:  Comparison: None.  Findings: Calcification within the triangular fibrocartilage complex.  Osteopenia.  No evidence of acute, subacute, or healed fractures.  Joint spaces well preserved for age.  Diffuse soft tissue swelling.  IMPRESSION: Calcification in the triangular fibrocartilage complex consistent with CPPD.  Osteopenia.  No acute osseous abnormality.  Original Report Authenticated By: Arnell Sieving, M.D.     1. Arthropathy of wrist       MDM  X-ray reviewed, and radiologist reading suggests CPPD. Patient's presentation of a monoarticular joint pain and swelling is consistent with gout or another crystalline arthropathy. Given the patient's age she is not a good candidate for high-dose anti-inflammatories; I will give her a course of steroids, pain medication, and a wrist splint for comfort. I have suggested that she followup with the orthopedic doctor for further evaluation if this becomes an ongoing problem. She is also to followup with her regular doctor for a recheck to monitor resolution of the problem.        Elwyn Reach [place], [place] 01/10/11 1253

## 2011-01-10 NOTE — ED Notes (Signed)
Pt is concerned that the medicine for bladder infection may cause tendonitis

## 2011-01-10 NOTE — ED Provider Notes (Signed)
I have personally performed and participated in all the services and procedures documented herein. I have reviewed the findings with the patient. Please see separate progress note for details of my personal interaction with the patient.  Dione Booze, MD 01/10/11 [phone]

## 2011-04-26 ENCOUNTER — Encounter (HOSPITAL_COMMUNITY): Payer: Self-pay | Admitting: *Deleted

## 2011-04-26 ENCOUNTER — Emergency Department (HOSPITAL_COMMUNITY): Payer: Medicare HMO

## 2011-04-26 ENCOUNTER — Inpatient Hospital Stay (HOSPITAL_COMMUNITY)
Admission: EM | Admit: 2011-04-26 | Discharge: 2011-04-28 | DRG: 176 | Disposition: A | Discharge status: Home Health | Payer: Medicare HMO | Attending: Internal Medicine | Admitting: Internal Medicine

## 2011-04-26 ENCOUNTER — Other Ambulatory Visit: Payer: Self-pay

## 2011-04-26 DIAGNOSIS — I2699 Other pulmonary embolism without acute cor pulmonale: Principal | ICD-10-CM | POA: Diagnosis present

## 2011-04-26 DIAGNOSIS — I10 Essential (primary) hypertension: Secondary | ICD-10-CM | POA: Diagnosis present

## 2011-04-26 DIAGNOSIS — E785 Hyperlipidemia, unspecified: Secondary | ICD-10-CM | POA: Diagnosis present

## 2011-04-26 DIAGNOSIS — I4891 Unspecified atrial fibrillation: Secondary | ICD-10-CM | POA: Diagnosis present

## 2011-04-26 DIAGNOSIS — K219 Gastro-esophageal reflux disease without esophagitis: Secondary | ICD-10-CM | POA: Diagnosis present

## 2011-04-26 HISTORY — DX: Gastro-esophageal reflux disease without esophagitis: K21.9

## 2011-04-26 HISTORY — DX: Hyperlipidemia, unspecified: E78.5

## 2011-04-26 HISTORY — DX: Malignant neoplasm of cervix uteri, unspecified: C53.9

## 2011-04-26 LAB — D-DIMER, QUANTITATIVE: D-Dimer, Quant: 6.9 ug/mL-FEU — ABNORMAL HIGH (ref 0.00–0.48)

## 2011-04-26 LAB — DIFFERENTIAL
Basophils Absolute: 0 10*3/uL (ref 0.0–0.1)
Lymphocytes Relative: 12 % (ref 12–46)
Lymphs Abs: 1 10*3/uL (ref 0.7–4.0)
Monocytes Absolute: 0.7 10*3/uL (ref 0.1–1.0)
Neutro Abs: 6.3 10*3/uL (ref 1.7–7.7)

## 2011-04-26 LAB — URINE MICROSCOPIC-ADD ON

## 2011-04-26 LAB — URINALYSIS, ROUTINE W REFLEX MICROSCOPIC
Protein, ur: 30 mg/dL — AB
Urobilinogen, UA: 1 mg/dL (ref 0.0–1.0)

## 2011-04-26 LAB — CBC
HCT: 38 % (ref 36.0–46.0)
RBC: 4.43 MIL/uL (ref 3.87–5.11)
RDW: 14 % (ref 11.5–15.5)
WBC: 8.1 10*3/uL (ref 4.0–10.5)

## 2011-04-26 LAB — COMPREHENSIVE METABOLIC PANEL
ALT: 10 U/L (ref 0–35)
AST: 12 U/L (ref 0–37)
CO2: 26 mEq/L (ref 19–32)
Chloride: 100 mEq/L (ref 96–112)
Creatinine, Ser: 0.91 mg/dL (ref 0.50–1.10)
GFR calc non Af Amer: 58 mL/min — ABNORMAL LOW (ref >90)
Glucose, Bld: 101 mg/dL — ABNORMAL HIGH (ref 70–99)
Sodium: 135 mEq/L (ref 135–145)
Total Bilirubin: 1.6 mg/dL — ABNORMAL HIGH (ref 0.3–1.2)

## 2011-04-26 LAB — HEPARIN LEVEL (UNFRACTIONATED): Heparin Unfractionated: 0.49 IU/mL (ref 0.30–0.70)

## 2011-04-26 MED ORDER — ACETAMINOPHEN 325 MG PO TABS
650.0000 mg | ORAL_TABLET | Freq: Once | ORAL | Status: AC
  Administered 2011-04-26: 650 mg via ORAL
  Filled 2011-04-26: qty 2

## 2011-04-26 MED ORDER — POLYETHYLENE GLYCOL 3350 17 G PO PACK
17.0000 g | PACK | Freq: Every day | ORAL | Status: DC | PRN
  Filled 2011-04-26: qty 1

## 2011-04-26 MED ORDER — ACETAMINOPHEN 650 MG RE SUPP: 650.0000 mg | Freq: Four times a day (QID) | RECTAL | Status: DC | PRN

## 2011-04-26 MED ORDER — PANTOPRAZOLE SODIUM 40 MG PO TBEC
40.0000 mg | DELAYED_RELEASE_TABLET | Freq: Every day | ORAL | Status: DC
  Administered 2011-04-26 – 2011-04-28 (×3): 40 mg via ORAL
  Filled 2011-04-26 (×4): qty 1

## 2011-04-26 MED ORDER — ENOXAPARIN SODIUM 60 MG/0.6ML ~~LOC~~ SOLN
60.0000 mg | Freq: Once | SUBCUTANEOUS | Status: DC
  Filled 2011-04-26: qty 0.6

## 2011-04-26 MED ORDER — IOHEXOL 300 MG/ML  SOLN
100.0000 mL | Freq: Once | INTRAMUSCULAR | Status: AC | PRN
  Administered 2011-04-26: 100 mL via INTRAVENOUS

## 2011-04-26 MED ORDER — HEPARIN SODIUM (PORCINE) 5000 UNIT/ML IJ SOLN: 5000.0000 [IU] | Freq: Once | INTRAMUSCULAR | Status: DC

## 2011-04-26 MED ORDER — SODIUM CHLORIDE 0.9 % IJ SOLN: 3.0000 mL | Freq: Two times a day (BID) | INTRAMUSCULAR | Status: DC

## 2011-04-26 MED ORDER — WARFARIN SODIUM 5 MG PO TABS
5.0000 mg | ORAL_TABLET | Freq: Once | ORAL | Status: AC
  Administered 2011-04-26: 5 mg via ORAL
  Filled 2011-04-26: qty 1

## 2011-04-26 MED ORDER — ONDANSETRON HCL 4 MG/2ML IJ SOLN: 4.0000 mg | Freq: Four times a day (QID) | INTRAMUSCULAR | Status: DC | PRN

## 2011-04-26 MED ORDER — SIMVASTATIN 20 MG PO TABS
20.0000 mg | ORAL_TABLET | Freq: Every day | ORAL | Status: DC
  Administered 2011-04-26 – 2011-04-27 (×2): 20 mg via ORAL
  Filled 2011-04-26 (×4): qty 1

## 2011-04-26 MED ORDER — ACETAMINOPHEN 325 MG PO TABS
650.0000 mg | ORAL_TABLET | Freq: Four times a day (QID) | ORAL | Status: DC | PRN
  Administered 2011-04-26 – 2011-04-28 (×4): 650 mg via ORAL
  Filled 2011-04-26 (×4): qty 2

## 2011-04-26 MED ORDER — HEPARIN (PORCINE) IN NACL 100-0.45 UNIT/ML-% IJ SOLN
15.0000 [IU]/kg/h | INTRAMUSCULAR | Status: DC
  Administered 2011-04-26 – 2011-04-27 (×2): 15 [IU]/kg/h via INTRAVENOUS
  Filled 2011-04-26 (×3): qty 250

## 2011-04-26 MED ORDER — OXYCODONE HCL 5 MG PO TABS
5.0000 mg | ORAL_TABLET | ORAL | Status: DC | PRN
  Administered 2011-04-27: 5 mg via ORAL
  Filled 2011-04-26 (×3): qty 1

## 2011-04-26 MED ORDER — ATENOLOL 50 MG PO TABS
50.0000 mg | ORAL_TABLET | Freq: Every day | ORAL | Status: DC
  Administered 2011-04-26 – 2011-04-28 (×2): 50 mg via ORAL
  Filled 2011-04-26 (×4): qty 1

## 2011-04-26 MED ORDER — WARFARIN - PHARMACIST DOSING INPATIENT
Freq: Every day | Status: DC
  Filled 2011-04-26 (×3): qty 1

## 2011-04-26 MED ORDER — SODIUM CHLORIDE 0.9 % IV SOLN: 250.0000 mL | INTRAVENOUS | Status: DC | PRN

## 2011-04-26 MED ORDER — ONDANSETRON HCL 4 MG PO TABS: 4.0000 mg | ORAL_TABLET | Freq: Four times a day (QID) | ORAL | Status: DC | PRN

## 2011-04-26 MED ORDER — HEPARIN BOLUS VIA INFUSION
1000.0000 [IU] | Freq: Once | INTRAVENOUS | Status: AC
  Administered 2011-04-26: 1000 [IU] via INTRAVENOUS

## 2011-04-26 MED ORDER — SODIUM CHLORIDE 0.9 % IJ SOLN: 3.0000 mL | INTRAMUSCULAR | Status: DC | PRN

## 2011-04-26 NOTE — ED Notes (Signed)
Chest pain for two days and pain to right side with movement or deep breathing. Pt took an extra strength tylenol yesterday which improved pain. Pt reports dizziness with getting up from a sitting position. Pt reports decreased appetite.

## 2011-04-26 NOTE — Progress Notes (Signed)
ANTICOAGULATION CONSULT NOTE - Initial Consult  Pharmacy Consult for Heparin Indication: pulmonary embolus  Allergies  Allergen Reactions  . Codeine Other (See Comments)    dizziness    Patient Measurements: Height: 5\' 5"  (165.1 cm) Weight: 147 lb (66.679 kg) IBW/kg (Calculated) : 57  Heparin Dosing Weight:   Vital Signs: Temp: 100.4 F (38 C) (03/02 0905) Temp src: Oral (03/02 0905) BP: 131/72 mmHg (03/02 0905) Pulse Rate: 81  (03/02 0905)  Labs:  Basename 04/26/11 0925  HGB 12.7  HCT 38.0  PLT 141*  APTT --  LABPROT --  INR --  HEPARINUNFRC --  CREATININE 0.91  CKTOTAL 45  CKMB 1.5  TROPONINI <0.30   Estimated Creatinine Clearance: 44.4 ml/min (by C-G formula based on Cr of 0.91).  Medical History: Past Medical History  Diagnosis Date  . Hypertension   . Cancer   . Atrial fibrillation   . Bleeding from anus   . Diverticulitis of colon     Medications:  Infusions:    . heparin      Assessment: Patient with PE.  Lovenox ordered and sent to ED.  Per RN not given, informed of new orders for heparin.   Goal of Therapy:  Heparin level 0.3-0.7 units/ml   Plan:  Heparin bolus 1000 units x1 Heparin drip at 900 units/hr Heparin level/CBC daily and Heparin level at 2100.  Aleene Davidson Crowford 04/26/2011,12:11 PM

## 2011-04-26 NOTE — H&P (Addendum)
Hospital Admission Note Date: 04/26/2011  Patient name: Sara Butler Medical record number: [id number] Date of birth: 07-12-1928 Age: 78 y.o. Gender: female PCP: Cain Saupe, MD.  Attending physician: Hillery Aldo, MD Emergency Contact: Oley Balm (daughter) [phone] Code Status: Full  Chief Complaint: "Pain in my chest and in my side".  History of Present Illness: Sara Butler is an 78 y.o. female with PMH of atrial fibrillation, not currently on coumadin due to a history of GI hemorrhage, who presented to the hospital with a 24 hour history of worsening pain across chest and in the right side, which increased with respiration.  No associated cough.  States she had a fever of 104 in the ER.  Upon initial evaluation in the ER, she was discovered to have right sided pulmonary emboli, and was subsequently referred to the hospitalist service for further evaluation and treatment.   Past Medical History Past Medical History  Diagnosis Date  . Hypertension   . Cervical cancer   . Atrial fibrillation   . Bleeding from anus   . Diverticulitis of colon   . Hyperlipidemia   . GERD (gastroesophageal reflux disease)     Past Surgical History Past Surgical History  Procedure Date  . Abdominal hysterectomy   . Back surgery     Meds: Prior to Admission medications   Medication Sig Start Date End Date Taking? Authorizing Provider  acetaminophen (TYLENOL) 325 MG tablet Take 325 mg by mouth every 6 (six) hours as needed. For headache    Yes Historical Provider, MD  atenolol (TENORMIN) 50 MG tablet Take 50 mg by mouth daily.     Yes Historical Provider, MD  lidocaine (LIDODERM) 5 % Place 1 patch onto the skin daily. Remove & Discard patch within 12 hours or as directed by MD    Yes Historical Provider, MD  omeprazole (PRILOSEC) 20 MG capsule Take 20 mg by mouth daily.     Yes Historical Provider, MD  simvastatin (ZOCOR) 20 MG tablet Take 20 mg by mouth at bedtime.     Yes Historical  Provider, MD    Allergies: Codeine  Social History: History   Social History  . Marital Status: Widowed    Spouse Name: N/A    Number of Children: 4  . Years of Education: 12   Occupational History  . Retired Administrator, sports at [employer]    Social History Main Topics  . Smoking status: Former Smoker -- 0.1 packs/day for 58 years    Types: Cigarettes  . Smokeless tobacco: Never Used  . Alcohol Use: No  . Drug Use: Not on file  . Sexually Active: No   Other Topics Concern  . Not on file   Social History Narrative   Widowed. Lives alone.  Normally independent of ADLs and ambulation.    Family History:  Family History  Problem Relation Age of Onset  . Diabetes Brother     Review of Systems: Constitutional: +fever, no chills;  Appetite normal; No weight loss.  HEENT: No blurry vision or diplopia, no pharyngitis or dysphagia CV: +chest pain, no arrhythmia.  Resp: +SOB, no cough. GI: No N/V, no diarrhea, no melena or hematochezia.  GU: No dysuria or hematuria.  MSK: no myalgias/arthralgias, generalized back ache.  Neuro:  + headache or focal neurological deficits.  Psych: No depression or anxiety.  Endo: No thyroid disease or DM.  Skin: No rashes or lesions.  Heme: No anemia or blood dyscrasia   Physical Exam: Blood pressure  122/73, pulse 74, temperature 99.5 F (37.5 C), temperature source Oral, resp. rate 16, height 5\' 5"  (1.651 m), weight 61.8 kg (136 lb 3.9 oz), SpO2 98.00%. BP 122/73  Pulse 74  Temp(Src) 99.5 F (37.5 C) (Oral)  Resp 16  Ht 5\' 5"  (1.651 m)  Wt 61.8 kg (136 lb 3.9 oz)  BMI 22.67 kg/m2  SpO2 98%  General Appearance:    Alert, cooperative, no distress, appears stated age  Head:    Normocephalic, without obvious abnormality, atraumatic  Eyes:    PERRL, conjunctiva/corneas clear, EOM's intact, arcus senilis bilateral  Ears:    Normal external ear canals, both ears  Nose:   Nares normal, septum midline, mucosa normal, no drainage    or sinus tenderness    Throat:   Lips, mucosa, and tongue normal; wears dentures  Neck:   Supple, symmetrical, trachea midline, no adenopathy;    thyroid:  no enlargement/tenderness/nodules; no carotid   bruit or JVD  Back:     Symmetric, no curvature, ROM normal, no CVA tenderness  Lungs:     Clear to auscultation bilaterally, respirations unlabored  Chest Wall:    Tender right flank   Heart:    Regular rate and rhythm, S1 and S2 normal, no murmur, rub   or gallop  Abdomen:     Soft, non-tender, bowel sounds active all four quadrants,    no masses, no organomegaly  Extremities:   Extremities normal, atraumatic, no cyanosis or edema  Pulses:   2+ and symmetric all extremities  Skin:   Skin color, texture, turgor normal, no rashes or lesions  Lymph nodes:   Cervical, supraclavicular, and axillary nodes normal  Neurologic:   Non-focal   Lab results: Basic Metabolic Panel:  Lab 04/26/11 4259  NA 135  K 4.1  CL 100  CO2 26  GLUCOSE 101*  BUN 13  CREATININE 0.91  CALCIUM 10.4  MG --  PHOS --   GFR Estimated Creatinine Clearance: 44.4 ml/min (by C-G formula based on Cr of 0.91). Liver Function Tests:  Lab 04/26/11 0925  AST 12  ALT 10  ALKPHOS 77  BILITOT 1.6*  PROT 7.2  ALBUMIN 3.2*    CBC:  Lab 04/26/11 0925  WBC 8.1  NEUTROABS 6.3  HGB 12.7  HCT 38.0  MCV 85.8  PLT 141*   Cardiac Enzymes:  Lab 04/26/11 0925  CKTOTAL 45  CKMB 1.5  CKMBINDEX --  TROPONINI <0.30   D-Dimer  Basename 04/26/11 0925  DDIMER 6.90*    Imaging results:   Dg Chest 2 View 04/26/2011  IMPRESSION:  1.  Cardiomegaly. 2.  Small bilateral pleural effusions and right lower lobe atelectasis or infiltrate.  Original Report Authenticated By: Patterson Hammersmith, M.D.    Ct Angio Chest W/cm &/or Wo Cm 04/26/2011 IMPRESSION:  1.  Multiple segmental and subsegmental right lower lobe pulmonary emboli. I telephoned the critical test results to Dr. Anitra Lauth at the time of interpretation. 2.  Small right pleural  effusion and posterior right lower lobe airspace disease which may represent atelectasis, pneumonia, or infarct.  Original Report Authenticated By: Osa Craver, M.D.    Assessment & Plan: Principal Problem:  *Pulmonary emboli The patient reports recent RLE swelling, gone now, so the source of her PE was likely from a RLE DVT.  She denies recent travel or surgery so this is unprovoked.  No FH of blood clots.  Will start on IV heparin since this is easily reversed, and start  PO coumadin.  She has tolerated coumadin in the past.  Recommend treatment x 3 months. Active Problems:  Hypertension Continue home dose of atenolol.  Atrial fibrillation In NSR on 12 lead EKG.  Not currently anti-coagulated due to h/o GI hemorrhage.  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  Continue beta blocker.  Hyperlipidemia Continue Zocor.  GERD (gastroesophageal reflux disease) Continue PPI.  Prophylaxis: On therapeutic anti-coagulation.  Time Spent On Admission: 1 hour.  Bartolo Montanye 04/26/2011, 3:21 PM Pager [phone]

## 2011-04-26 NOTE — ED Notes (Addendum)
Report to grace, rn-transfer to rm [phone]

## 2011-04-26 NOTE — ED Provider Notes (Signed)
History     CSN: [id number]  Arrival date & time 04/26/11  0846   First MD Initiated Contact with Patient 04/26/11 [phone]      Chief Complaint  Patient presents with  . Chest Pain    (Consider location/radiation/quality/duration/timing/severity/associated sxs/prior treatment) Patient is a 78 y.o. female presenting with chest pain. The history is provided by the patient.  Chest Pain The chest pain began yesterday. Duration of episode(s) is 1 day. Chest pain occurs constantly. The chest pain is unchanged. The pain is associated with breathing. At its most intense, the pain is at 5/10. The pain is currently at 5/10. The severity of the pain is moderate. The quality of the pain is described as pleuritic and sharp. The pain does not radiate. Chest pain is worsened by deep breathing (Palpation). Pertinent negatives for primary symptoms include no fever, no shortness of breath, no cough, no palpitations, no abdominal pain, no nausea and no vomiting. Primary symptoms comment: Pain is not changed by eating  Pertinent negatives for associated symptoms include no diaphoresis, no lower extremity edema and no near-syncope. Associated symptoms comments: Mild dizziness with standing. She tried nothing for the symptoms. Risk factors include being elderly.  Her past medical history is significant for diabetes and hypertension.  Procedure history is negative for cardiac catheterization.     Past Medical History  Diagnosis Date  . Hypertension   . Cancer   . Atrial fibrillation   . Bleeding from anus   . Diverticulitis of colon     Past Surgical History  Procedure Date  . Abdominal hysterectomy   . Back surgery     No family history on file.  History  Substance Use Topics  . Smoking status: Former Games developer  . Smokeless tobacco: Not on file  . Alcohol Use: No    OB History    Grav Para Term Preterm Abortions TAB SAB Ect Mult Living                  Review of Systems  Constitutional:  Negative for fever and diaphoresis.  Respiratory: Negative for cough and shortness of breath.   Cardiovascular: Positive for chest pain. Negative for palpitations and near-syncope.  Gastrointestinal: Negative for nausea, vomiting and abdominal pain.  All other systems reviewed and are negative.    Allergies  Codeine  Home Medications   Current Outpatient Rx  Name Route Sig Dispense Refill  . ACETAMINOPHEN 325 MG PO TABS Oral Take 325 mg by mouth every 6 (six) hours as needed. For headache     . ATENOLOL 50 MG PO TABS Oral Take 50 mg by mouth daily.      Marland Kitchen LIDOCAINE 5 % EX PTCH Transdermal Place 1 patch onto the skin daily. Remove & Discard patch within 12 hours or as directed by MD     . OMEPRAZOLE 20 MG PO CPDR Oral Take 20 mg by mouth daily.      Marland Kitchen SIMVASTATIN 20 MG PO TABS Oral Take 20 mg by mouth at bedtime.        BP 131/72  Pulse 81  Temp(Src) 100.4 F (38 C) (Oral)  Resp 18  Ht 5\' 5"  (1.651 m)  Wt 147 lb (66.679 kg)  BMI 24.46 kg/m2  SpO2 97%  Physical Exam  Nursing note and vitals reviewed. Constitutional: She is oriented to person, place, and time. She appears well-developed and well-nourished. No distress.  HENT:  Head: Normocephalic and atraumatic.  Mouth/Throat: Oropharynx is clear and moist.  Eyes: EOM are normal. Pupils are equal, round, and reactive to light.  Cardiovascular: Normal rate, regular rhythm, normal heart sounds and intact distal pulses.  Exam reveals no friction rub.   No murmur heard. Pulmonary/Chest: Effort normal and breath sounds normal. She has no wheezes. She has no rales. She exhibits tenderness. She exhibits no crepitus.    Abdominal: Soft. Bowel sounds are normal. She exhibits no distension. There is no tenderness. There is no rebound and no guarding.  Musculoskeletal: Normal range of motion. She exhibits no tenderness.       No edema  Neurological: She is alert and oriented to person, place, and time. No cranial nerve deficit.    Skin: Skin is warm and dry. No rash noted.  Psychiatric: She has a normal mood and affect. Her behavior is normal.    ED Course  Procedures (including critical care time)   Labs Reviewed  CBC  DIFFERENTIAL  COMPREHENSIVE METABOLIC PANEL  URINALYSIS, ROUTINE W REFLEX MICROSCOPIC  CARDIAC PANEL(CRET KIN+CKTOT+MB+TROPI)  D-DIMER, QUANTITATIVE   No results found.   Date: 04/26/2011  Rate: 82  Rhythm: normal sinus rhythm  QRS Axis: normal  Intervals: normal  ST/T Wave abnormalities: normal  Conduction Disutrbances:none  Narrative Interpretation:   Old EKG Reviewed: unchanged   No diagnosis found.    MDM   Patient with chest pain started yesterday it seems to be more pleuritic in nature. She states it's in the center and on the right side. On exam pain was worsened when she took deep breaths. She denies any GI symptoms and denies shortness of breath. No infectious symptoms or cough but low grade fever of 100.4 on exam. Patient has a past history of atrial fib but today he is in sinus rhythm her EKG. Symptoms sound less cardiac in nature given the way she describes her pain however she is at risk with diabetes, hypertension, being a former smoker. EKG is within normal limits but will get a chest x-ray, cardiac enzymes. Also concern for possible PE.  D-dimer pending.  No symptoms suggestive of GI or GU pathology. Will give patient medication for her chest pain and headache.  D-dimer elevated and rest of labs within normal limits. Chest x-ray with possible infiltrate on the right side. Concern for PE CT in June shows multiple PEs in the right side. Patient given a single dose of Lovenox here however she has a history in 2011 of diverticular bleeding. Will need close follow to make sure GI bleeding has not recurred.      Gwyneth Sprout, MD 04/26/11 1145

## 2011-04-26 NOTE — Progress Notes (Signed)
ANTICOAGULATION CONSULT NOTE - Follow Up Consult  Pharmacy Consult for warfarin  Indication: pulmonary embolus   Allergies  Allergen Reactions  . Codeine Other (See Comments)    dizziness    Patient Measurements: Height: 5\' 5"  (165.1 cm) Weight: 136 lb 3.9 oz (61.8 kg) IBW/kg (Calculated) : 57   Vital Signs: Temp: 99.5 F (37.5 C) (03/02 1434) Temp src: Oral (03/02 1434) BP: 122/73 mmHg (03/02 1434) Pulse Rate: 74  (03/02 1434)  Labs:  Basename 04/26/11 2025 04/26/11 0925  HGB -- 12.7  HCT -- 38.0  PLT -- 141*  APTT -- --  LABPROT -- --  INR -- --  HEPARINUNFRC 0.49 --  CREATININE -- 0.91  CKTOTAL -- 45  CKMB -- 1.5  TROPONINI -- <0.30   Estimated Creatinine Clearance: 44.4 ml/min (by C-G formula based on Cr of 0.91).   Medications:  Infusions:    . heparin 15 Units/kg/hr (04/26/11 1242)    Assessment: Heparin level is therapeutic after 1000 unit bolus and infusion @ 900 units/hour No bleeding reported  Goal of Therapy:  Heparin level 0.3-0.7 units/ml   Plan:  Continue heparin @ 23ml/hr. F/U Heparin level in AM  [facility] PharmD  [phone] 04/26/2011 9:13 PM

## 2011-04-26 NOTE — Progress Notes (Signed)
ANTICOAGULATION CONSULT NOTE - Initial Consult  Pharmacy Consult for warfarin Indication: pulmonary embolus  Allergies  Allergen Reactions  . Codeine Other (See Comments)    dizziness    Patient Measurements: Height: 5\' 5"  (165.1 cm) Weight: 136 lb 3.9 oz (61.8 kg) IBW/kg (Calculated) : 57  Heparin Dosing Weight:   Vital Signs: Temp: 99.5 F (37.5 C) (03/02 1434) Temp src: Oral (03/02 1434) BP: 122/73 mmHg (03/02 1434) Pulse Rate: 74  (03/02 1434)  Labs:  Basename 04/26/11 0925  HGB 12.7  HCT 38.0  PLT 141*  APTT --  LABPROT --  INR --  HEPARINUNFRC --  CREATININE 0.91  CKTOTAL 45  CKMB 1.5  TROPONINI <0.30   Estimated Creatinine Clearance: 44.4 ml/min (by C-G formula based on Cr of 0.91).  Medical History: Past Medical History  Diagnosis Date  . Hypertension   . Cervical cancer   . Atrial fibrillation   . Bleeding from anus   . Diverticulitis of colon   . Hyperlipidemia   . GERD (gastroesophageal reflux disease)       Assessment: Patient with PE, heparin started and charted in  [facility].  Goal of Therapy:  INR 2-3   Plan:  Warfarin 5mg  po x1, daily INR  Aleene Davidson Crowford 04/26/2011,3:36 PM

## 2011-04-27 LAB — BASIC METABOLIC PANEL
BUN: 11 mg/dL (ref 6–23)
Chloride: 107 mEq/L (ref 96–112)
Creatinine, Ser: 0.93 mg/dL (ref 0.50–1.10)
GFR calc Af Amer: 66 mL/min — ABNORMAL LOW (ref >90)

## 2011-04-27 LAB — CBC
HCT: 36.5 % (ref 36.0–46.0)
MCH: 28.5 pg (ref 26.0–34.0)
MCHC: 32.9 g/dL (ref 30.0–36.0)
MCV: 86.7 fL (ref 78.0–100.0)
RDW: 14.1 % (ref 11.5–15.5)
WBC: 4.1 10*3/uL (ref 4.0–10.5)

## 2011-04-27 MED ORDER — WARFARIN SODIUM 5 MG PO TABS
5.0000 mg | ORAL_TABLET | Freq: Once | ORAL | Status: AC
  Administered 2011-04-27: 5 mg via ORAL
  Filled 2011-04-27: qty 1

## 2011-04-27 MED ORDER — HEPARIN BOLUS VIA INFUSION
1000.0000 [IU] | Freq: Once | INTRAVENOUS | Status: AC
  Administered 2011-04-27: 1000 [IU] via INTRAVENOUS
  Filled 2011-04-27: qty 1000

## 2011-04-27 MED ORDER — HEPARIN (PORCINE) IN NACL 100-0.45 UNIT/ML-% IJ SOLN
1100.0000 [IU]/h | INTRAMUSCULAR | Status: DC
  Administered 2011-04-28: 1100 [IU]/h via INTRAVENOUS
  Filled 2011-04-27 (×2): qty 250

## 2011-04-27 NOTE — Progress Notes (Signed)
ANTICOAGULATION CONSULT NOTE - Follow Up Consult  Pharmacy Consult for Heparin/Warfarin Indication: PE  Allergies  Allergen Reactions  . Codeine Other (See Comments)    dizziness    Patient Measurements: Height: 5\' 5"  (165.1 cm) Weight: 136 lb 3.9 oz (61.8 kg) IBW/kg (Calculated) : 57   Vital Signs: Temp: 98 F (36.7 C) (03/03 0429) Temp src: Oral (03/03 0429) BP: 144/80 mmHg (03/03 0949) Pulse Rate: 90  (03/03 0949)  Labs:  Basename 04/27/11 0526 04/26/11 2025 04/26/11 0925  HGB 12.0 -- 12.7  HCT 36.5 -- 38.0  PLT 146* -- 141*  APTT -- -- --  LABPROT 13.5 -- --  INR 1.01 -- --  HEPARINUNFRC 0.40 0.49 --  CREATININE 0.93 -- 0.91  CKTOTAL -- -- 45  CKMB -- -- 1.5  TROPONINI -- -- <0.30   Estimated Creatinine Clearance: 43.4 ml/min (by C-G formula based on Cr of 0.93).   Medications:  Scheduled:    . atenolol  50 mg Oral Daily  . heparin  1,000 Units Intravenous Once  . pantoprazole  40 mg Oral Q1200  . simvastatin  20 mg Oral QHS  . sodium chloride  3 mL Intravenous Q12H  . sodium chloride  3 mL Intravenous Q12H  . warfarin  5 mg Oral ONCE-1800  . warfarin  5 mg Oral ONCE-1800  . Warfarin - Pharmacist Dosing Inpatient   Does not apply q1800  . DISCONTD: enoxaparin  60 mg Subcutaneous Once  . DISCONTD: heparin subcutaneous  5,000 Units Subcutaneous Once   Infusions:    . heparin 15 Units/kg/hr (04/27/11 0500)    Assessment:  78 yo AAF with hx of AFib, no anticoagulation due to previous GI bleed. Familiar with Warfarin  New PE, likely from DVT RLE-hx RLE swelling resolved.  Day 2/5 minimum overlap Heparin/Coumadin. Will require 2 days of therapeutic INR before Heparin can be d/c.  2nd Heparin level in goal range.  Goal of Therapy:  INR 2-3 Heparin level 0.3-0.7 units/ml   Plan:   Continue Heparin at 900 units/hr, repeat level this afternoon to ensure level remains in range  Repeat 5mg  coumadin today  Daily INR,heparin level, CBC.  Otho Bellows PharmD Pager [phone] 04/27/2011,10:45 AM

## 2011-04-27 NOTE — Progress Notes (Signed)
ANTICOAGULATION CONSULT NOTE - Follow Up Consult  Pharmacy Consult for Heparin/Warfarin Indication: PE  Allergies  Allergen Reactions  . Codeine Other (See Comments)    dizziness    Patient Measurements: Height: 5\' 5"  (165.1 cm) Weight: 136 lb 3.9 oz (61.8 kg) IBW/kg (Calculated) : 57   Vital Signs: Temp: 99 F (37.2 C) (03/03 1353) Temp src: Oral (03/03 1353) BP: 136/74 mmHg (03/03 1353) Pulse Rate: 68  (03/03 1353)  Labs:  Basename 04/27/11 1830 04/27/11 0526 04/26/11 2025 04/26/11 0925  HGB -- 12.0 -- 12.7  HCT -- 36.5 -- 38.0  PLT -- 146* -- 141*  APTT -- -- -- --  LABPROT -- 13.5 -- --  INR -- 1.01 -- --  HEPARINUNFRC 0.20* 0.40 0.49 --  CREATININE -- 0.93 -- 0.91  CKTOTAL -- -- -- 45  CKMB -- -- -- 1.5  TROPONINI -- -- -- <0.30   Estimated Creatinine Clearance: 43.4 ml/min (by C-G formula based on Cr of 0.93).   Medications:  Scheduled:     . atenolol  50 mg Oral Daily  . pantoprazole  40 mg Oral Q1200  . simvastatin  20 mg Oral QHS  . sodium chloride  3 mL Intravenous Q12H  . sodium chloride  3 mL Intravenous Q12H  . warfarin  5 mg Oral ONCE-1800  . Warfarin - Pharmacist Dosing Inpatient   Does not apply q1800   Infusions:     . heparin 15 Units/kg/hr (04/27/11 0500)    Assessment:  78 yo AAF with hx of AFib, no anticoagulation due to previous GI bleed. Familiar with Warfarin  New PE, likely from DVT RLE-hx RLE swelling resolved.  Day 2/5 minimum overlap Heparin/Coumadin. Will require 2 days of therapeutic INR before Heparin can be d/c.  Heparin level below goal range.  Verifed w/ RN that no interruptions and that rate is at 900 units/hour  No bleeding reported  Goal of Therapy:  INR 2-3 Heparin level 0.3-0.7 units/ml   Plan:   Rebolus 1000 units, increase heparin at 1100 units/hr, repeat level in 8 hours.  Gwen Her PharmD Pager [phone] 04/27/2011,6:58 PM

## 2011-04-27 NOTE — Progress Notes (Signed)
PROGRESS NOTE  Sara Butler:[id number] DOB: 10-23-1928 DOA: 04/26/2011 PCP: Candi Leash, MD, MD  Brief narrative: NUR RABOLD is an 78 y.o. female with PMH of atrial fibrillation, not currently on coumadin due to a history of GI hemorrhage, who presented to the hospital with a 24 hour history of worsening pain across chest and in the right side, which increased with respiration.  Upon initial evaluation in the ER, she was discovered to have right sided pulmonary emboli, and was subsequently referred to the hospitalist service for further evaluation and treatment.   Consultants:  Pharmacy for heparin/coumadin dosing  Procedures:  None  Antibiotics:  None   Subjective  Sara Butler is feeling better.  No dyspnea or chest pain.  No dyspepsia or blood in the stool.   Objective    Interim History: Stable over night.   Objective: Filed Vitals:   04/26/11 1434 04/26/11 2200 04/27/11 0429 04/27/11 0949  BP: 122/73 126/75 117/71 144/80  Pulse: 74 72 60 90  Temp: 99.5 F (37.5 C) 98.4 F (36.9 C) 98 F (36.7 C)   TempSrc: Oral Axillary Oral   Resp: 16 17 17    Height: 5\' 5"  (1.651 m)     Weight: 61.8 kg (136 lb 3.9 oz)     SpO2: 98% 96% 98%     Intake/Output Summary (Last 24 hours) at 04/27/11 1003 Last data filed at 04/27/11 0956  Gross per 24 hour  Intake   1068 ml  Output    300 ml  Net    768 ml    Exam: Gen:  NAD Cardiovascular:  RRR, No M/R/G Respiratory: Lungs CTAB Gastrointestinal: Abdomen soft, NT/ND with normal active bowel sounds. Extremities: No C/E/C    Data Reviewed: Basic Metabolic Panel:  Lab 04/27/11 5784 04/26/11 0925  NA 139 135  K 3.6 4.1  CL 107 100  CO2 28 26  GLUCOSE 105* 101*  BUN 11 13  CREATININE 0.93 0.91  CALCIUM 9.8 10.4  MG -- --  PHOS -- --   Liver Function Tests:  Lab 04/26/11 0925  AST 12  ALT 10  ALKPHOS 77  BILITOT 1.6*  PROT 7.2  ALBUMIN 3.2*   CBC:  Lab 04/27/11 0526 04/26/11 0925  WBC 4.1 8.1    NEUTROABS -- 6.3  HGB 12.0 12.7  HCT 36.5 38.0  MCV 86.7 85.8  PLT 146* 141*   Cardiac Enzymes:  Lab 04/26/11 0925  CKTOTAL 45  CKMB 1.5  CKMBINDEX --  TROPONINI <0.30     Studies: Dg Chest 2 View 04/26/2011 IMPRESSION: 1. Cardiomegaly. 2. Small bilateral pleural effusions and right lower lobe atelectasis or infiltrate. Original Report Authenticated By: Patterson Hammersmith, M.D.  Ct Angio Chest W/cm &/or Wo Cm 04/26/2011 IMPRESSION: 1. Multiple segmental and subsegmental right lower lobe pulmonary emboli. I telephoned the critical test results to Dr. Anitra Lauth at the time of interpretation. 2. Small right pleural effusion and posterior right lower lobe airspace disease which may represent atelectasis, pneumonia, or infarct. Original Report Authenticated By: Thora Lance III, M.D.   Scheduled Meds:   . atenolol  50 mg Oral Daily  . heparin  1,000 Units Intravenous Once  . pantoprazole  40 mg Oral Q1200  . simvastatin  20 mg Oral QHS  . sodium chloride  3 mL Intravenous Q12H  . sodium chloride  3 mL Intravenous Q12H  . warfarin  5 mg Oral ONCE-1800  . warfarin  5 mg Oral ONCE-1800  . Warfarin -  Pharmacist Dosing Inpatient   Does not apply q1800  . DISCONTD: enoxaparin  60 mg Subcutaneous Once  . DISCONTD: heparin subcutaneous  5,000 Units Subcutaneous Once   Continuous Infusions:   . heparin 15 Units/kg/hr (04/27/11 0500)     Assessment/Plan: Principal Problem:  *Pulmonary emboli  The patient reports recent RLE swelling, gone now, so the source of her PE was likely from a RLE DVT. She denies recent travel or surgery so this is unprovoked. No FH of blood clots. Will start on IV heparin since this is easily reversed, and start PO coumadin. She has tolerated coumadin in the past. Recommend treatment x 3 months. Consider outpatient hypercoagulable work up when off blood thinners. Active Problems:  Hypertension  Continue home dose of atenolol.  Atrial fibrillation  In NSR on  12 lead EKG. Not currently anti-coagulated due to h/o GI hemorrhage. 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 Continue beta blocker.  Hyperlipidemia  Continue Zocor.  GERD (gastroesophageal reflux disease)  Continue PPI.     Code Status: Full Family Communication: Family asleep at bedside. Disposition Plan: Home when stable   LOS: 1 day   Sara Aldo, MD Pager [phone]  04/27/2011, 10:03 AM

## 2011-04-27 NOTE — Progress Notes (Addendum)
ANTICOAGULATION CONSULT NOTE - Follow Up Consult  Pharmacy Consult for heparin/warfarin  Indication: pulmonary embolus   Allergies  Allergen Reactions  . Codeine Other (See Comments)    dizziness    Patient Measurements: Height: 5\' 5"  (165.1 cm) Weight: 136 lb 3.9 oz (61.8 kg) IBW/kg (Calculated) : 57   Vital Signs: Temp: 98 F (36.7 C) (03/03 0429) Temp src: Oral (03/03 0429) BP: 117/71 mmHg (03/03 0429) Pulse Rate: 60  (03/03 0429)  Labs:  Basename 04/27/11 0526 04/26/11 2025 04/26/11 0925  HGB 12.0 -- 12.7  HCT 36.5 -- 38.0  PLT 146* -- 141*  APTT -- -- --  LABPROT 13.5 -- --  INR 1.01 -- --  HEPARINUNFRC 0.40 0.49 --  CREATININE 0.93 -- 0.91  CKTOTAL -- -- 45  CKMB -- -- 1.5  TROPONINI -- -- <0.30   Estimated Creatinine Clearance: 43.4 ml/min (by C-G formula based on Cr of 0.93).   Medications:  Infusions:     . heparin 15 Units/kg/hr (04/27/11 0500)    Assessment:  Repeat Heparin level is therapeutic at rate of @ 900 units/hour  No bleeding reported  Day 2 overlap of Heparin/Coumadin  Goal of Therapy:  Heparin level 0.3-0.7 units/ml INR 2-3   Plan:  Continue heparin @ 57ml/hr. Coumadin 5mg  po x 1 today F/U Heparin level, CBC, INR in AM  Terrilee Files, PharmD 04/27/2011 6:10 AM

## 2011-04-28 LAB — COMPREHENSIVE METABOLIC PANEL
ALT: 10 U/L (ref 0–35)
AST: 10 U/L (ref 0–37)
Alkaline Phosphatase: 62 U/L (ref 39–117)
CO2: 25 mEq/L (ref 19–32)
Chloride: 108 mEq/L (ref 96–112)
Creatinine, Ser: 0.99 mg/dL (ref 0.50–1.10)
GFR calc non Af Amer: 52 mL/min — ABNORMAL LOW (ref >90)
Sodium: 140 mEq/L (ref 135–145)
Total Bilirubin: 0.3 mg/dL (ref 0.3–1.2)

## 2011-04-28 LAB — HEPARIN LEVEL (UNFRACTIONATED): Heparin Unfractionated: 0.64 IU/mL (ref 0.30–0.70)

## 2011-04-28 LAB — CBC
HCT: 34.4 % — ABNORMAL LOW (ref 36.0–46.0)
Hemoglobin: 11.2 g/dL — ABNORMAL LOW (ref 12.0–15.0)
MCHC: 32.6 g/dL (ref 30.0–36.0)

## 2011-04-28 LAB — PROTIME-INR: INR: 1.04 (ref 0.00–1.49)

## 2011-04-28 MED ORDER — ENOXAPARIN (LOVENOX) PATIENT EDUCATION KIT
PACK | Freq: Once | Status: AC
  Administered 2011-04-28: 11:00:00
  Filled 2011-04-28: qty 1

## 2011-04-28 MED ORDER — VITAMINS A & D EX OINT
TOPICAL_OINTMENT | CUTANEOUS | Status: AC
  Administered 2011-04-28: 5
  Filled 2011-04-28: qty 5

## 2011-04-28 MED ORDER — ENOXAPARIN SODIUM 60 MG/0.6ML ~~LOC~~ SOLN
60.0000 mg | Freq: Two times a day (BID) | SUBCUTANEOUS | Status: DC
  Administered 2011-04-28: 60 mg via SUBCUTANEOUS
  Filled 2011-04-28 (×4): qty 0.6

## 2011-04-28 MED ORDER — WARFARIN SODIUM 5 MG PO TABS
5.0000 mg | ORAL_TABLET | Freq: Once | ORAL | Status: DC
  Filled 2011-04-28: qty 1

## 2011-04-28 MED ORDER — ENOXAPARIN SODIUM 60 MG/0.6ML ~~LOC~~ SOLN: SUBCUTANEOUS | Status: DC

## 2011-04-28 MED ORDER — WARFARIN SODIUM 5 MG PO TABS: 5.0000 mg | ORAL_TABLET | Freq: Once | ORAL | Status: DC

## 2011-04-28 NOTE — Progress Notes (Signed)
ANTICOAGULATION CONSULT NOTE - Follow Up Consult  Pharmacy Consult for Lovenox/Warfarin Indication: PE  Allergies  Allergen Reactions  . Codeine Other (See Comments)    dizziness    Patient Measurements: Height: 5\' 5"  (165.1 cm) Weight: 136 lb 3.9 oz (61.8 kg) IBW/kg (Calculated) : 57   Vital Signs: Temp: 98.6 F (37 C) (03/04 0501) Temp src: Oral (03/04 0501) BP: 149/80 mmHg (03/04 0501) Pulse Rate: 69  (03/04 0501)  Labs:  Basename 04/28/11 0428 04/27/11 1830 04/27/11 0526 04/26/11 0925  HGB 11.2* -- 12.0 --  HCT 34.4* -- 36.5 38.0  PLT 162 -- 146* 141*  APTT -- -- -- --  LABPROT 13.8 -- 13.5 --  INR 1.04 -- 1.01 --  HEPARINUNFRC 0.64 0.20* 0.40 --  CREATININE 0.99 -- 0.93 0.91  CKTOTAL -- -- -- 45  CKMB -- -- -- 1.5  TROPONINI -- -- -- <0.30   Estimated Creatinine Clearance: 40.8 ml/min (by C-G formula based on Cr of 0.99).   Medications:  Scheduled:     . atenolol  50 mg Oral Daily  . enoxaparin   Does not apply Once  . heparin  1,000 Units Intravenous Once  . pantoprazole  40 mg Oral Q1200  . simvastatin  20 mg Oral QHS  . sodium chloride  3 mL Intravenous Q12H  . sodium chloride  3 mL Intravenous Q12H  . vitamin A & D      . warfarin  5 mg Oral ONCE-1800  . warfarin  5 mg Oral ONCE-1800  . Warfarin - Pharmacist Dosing Inpatient   Does not apply q1800   Infusions:     . DISCONTD: heparin 15 Units/kg/hr (04/27/11 0500)  . DISCONTD: heparin 1,100 Units/hr (04/28/11 0457)    Assessment:  78 yo AAF with hx of AFib, no anticoagulation due to previous GI bleed. Familiar with Warfarin  New PE, likely from DVT RLE-hx RLE swelling resolved.  Day 3/5 minimum overlap Heparin/Coumadin. Will require 2 days of therapeutic INR before Heparin can be d/c.  New order received to transition from IV heparin to SQ Lovenox today.  Goal of Therapy:  INR 2-3 Heparin level 0.3-0.7 units/ml   Plan:   Discontinue heparin  One hour after infusion is stopped,  begin Lovenox 60mg  (1mg /kg) SQ q12h  Warfarin already ordered for tonight (see previous note).  Elie Goody, PharmD, BCPS Pager: [phone] 04/28/2011  11:07 AM

## 2011-04-28 NOTE — Discharge Summary (Addendum)
Physician Discharge Summary  Patient ID: Sara Butler MRN: [id number] DOB/AGE: 1928-12-10 78 y.o.  Admit date: 04/26/2011 Discharge date: 04/28/2011  Primary Care Physician:  Candi Leash, MD, MD   Discharge Diagnoses:    Present on Admission:  .Hypertension .Atrial fibrillation .Hyperlipidemia .GERD (gastroesophageal reflux disease) .Pulmonary emboli  Discharge Medications:  Medication List  As of 04/28/2011  1:49 PM   TAKE these medications         acetaminophen 325 MG tablet   Commonly known as: TYLENOL   Take 325 mg by mouth every 6 (six) hours as needed. For headache      atenolol 50 MG tablet   Commonly known as: TENORMIN   Take 50 mg by mouth daily.      enoxaparin 60 MG/0.6ML Soln   Commonly known as: LOVENOX   Administer 1 pre-filled syringe under the skin every 12 hours. Disp # 10 prefilled syringes 1 RefillInject 0.6 mLs (60 mg total) into the skin every 12 (twelve) hours. Do NOT expel air bubble from syringe before giving.  Give first dose 1 hr after heparin infusion is stopped.      lidocaine 5 %   Commonly known as: LIDODERM   Place 1 patch onto the skin daily. Remove & Discard patch within 12 hours or as directed by MD      omeprazole 20 MG capsule   Commonly known as: PRILOSEC   Take 20 mg by mouth daily.      simvastatin 20 MG tablet   Commonly known as: ZOCOR   Take 20 mg by mouth at bedtime.      warfarin 5 MG tablet   Commonly known as: COUMADIN   Take 1 tablet (5 mg total) by mouth one time only at 6 PM. Or as directed by PCP.             Disposition and Follow-up: The patient is being discharged home.  She has a follow up appointment on 05/05/11 with Dr. Jillyn Hidden.  We are setting up a home health RN to draw a daily PT/INR to fax to Dr. Debroah Baller office for coumadin monitoring.   Significant Diagnostic Studies:   Dg Chest 2 View 04/26/2011 IMPRESSION: 1. Cardiomegaly. 2. Small bilateral pleural effusions and right lower lobe atelectasis or  infiltrate. Original Report Authenticated By: Patterson Hammersmith, M.D.  Ct Angio Chest W/cm &/or Wo Cm 04/26/2011 IMPRESSION: 1. Multiple segmental and subsegmental right lower lobe pulmonary emboli. I telephoned the critical test results to Dr. Anitra Lauth at the time of interpretation. 2. Small right pleural effusion and posterior right lower lobe airspace disease which may represent atelectasis, pneumonia, or infarct. Original Report Authenticated By: Osa Craver, M.D.    Discharge Laboratory Values: Basic Metabolic Panel:  Lab 04/28/11 6962 04/27/11 0526 04/26/11 0925  NA 140 139 135  K 3.7 3.6 --  CL 108 107 100  CO2 25 28 26   GLUCOSE 91 105* 101*  BUN 13 11 13   CREATININE 0.99 0.93 0.91  CALCIUM 9.6 9.8 10.4  MG -- -- --  PHOS -- -- --   GFR Estimated Creatinine Clearance: 40.8 ml/min (by C-G formula based on Cr of 0.99). Liver Function Tests:  Lab 04/28/11 0428 04/26/11 0925  AST 10 12  ALT 10 10  ALKPHOS 62 77  BILITOT 0.3 1.6*  PROT 6.1 7.2  ALBUMIN 2.7* 3.2*   Coagulation profile  Lab 04/28/11 0428 04/27/11 0526  INR 1.04 1.01  PROTIME -- --  CBC:  Lab 04/28/11 0428 04/27/11 0526 04/26/11 0925  WBC 4.0 4.1 8.1  NEUTROABS -- -- 6.3  HGB 11.2* 12.0 12.7  HCT 34.4* 36.5 38.0  MCV 86.4 86.7 85.8  PLT 162 146* 141*   Cardiac Enzymes:  Lab 04/26/11 0925  CKTOTAL 45  CKMB 1.5  CKMBINDEX --  TROPONINI <0.30   D-Dimer  Basename 04/26/11 0925  DDIMER 6.90*    Brief H and P: For complete details please refer to admission H and P, but in brief, MALAIA BUCHTA is an 78 y.o. female with PMH of atrial fibrillation, not currently on coumadin due to a history of GI hemorrhage, who presented to the hospital with a 24 hour history of worsening pain across chest and in the right side, which increased with respiration. Upon initial evaluation in the ER, she was discovered to have right sided pulmonary emboli, and was subsequently referred to the hospitalist  service for further evaluation and treatment.    Physical Exam at Discharge: BP 149/80  Pulse 69  Temp(Src) 98.6 F (37 C) (Oral)  Resp 18  Ht 5\' 5"  (1.651 m)  Wt 61.8 kg (136 lb 3.9 oz)  BMI 22.67 kg/m2  SpO2 98% Gen:  NAD Cardiovascular:  RRR, No M/R/G Respiratory: Lungs CTAB Gastrointestinal: Abdomen soft, NT/ND with normal active bowel sounds. Extremities: No C/E/C   Hospital Course:  Principal Problem:  *Pulmonary emboli  The patient reports recent RLE swelling, gone now, so the source of her PE was likely from a RLE DVT. She denies recent travel or surgery so this is unprovoked. No FH of blood clots. She was started on IV heparin since this is easily reversed, and PO coumadin. (She has tolerated coumadin in the past). On 04/28/11, she was transitioned to SQ Lovenox.  She was taught how to self inject Lovenox by the nursing staff.  HH RN will draw her daily PT/INR and these results will be faxed to her PCP.  Recommend treatment x 3 months. Consider outpatient hypercoagulable work up when off blood thinners.  Active Problems:  Hypertension  Continue home dose of atenolol.  Atrial fibrillation  In NSR on 12 lead EKG. Not currently anti-coagulated due to h/o GI hemorrhage. 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 Continue beta blocker.  Hyperlipidemia  Continued on Zocor.  GERD (gastroesophageal reflux disease)  Continued on PPI.    Recommendations for hospital follow-up: 1.  Daily PT/INR monitoring and adjustment of coumadin dose; D/C Lovenox when INR therapeutic x 48 hours. 2.  Consider hypercoagulable work up when off anti-coagulants  Diet:  Low sodium, heart healthy   Activity:  Increase activity slowly  Condition at Discharge:   Improved  Time spent on Discharge:  35 minutes  Signed: Dr. Trula Ore Lennon Boutwell Pager [phone] 04/28/2011, 1:49 PM

## 2011-04-28 NOTE — Progress Notes (Signed)
After explaining and discussing with patient  the benefits,do's and dont's and  how to administer Lovenox shots.Patient was able to self administer Lovenox injection on her  Right love handle without difficulty, she stated" it is easier than it looks".

## 2011-04-28 NOTE — Discharge Instructions (Signed)
Pulmonary Embolus A pulmonary (lung) embolus (PE) is a blood clot that has traveled from another place in the body to the lung. Most clots come from deep veins in the legs or pelvis. PE is a dangerous and potentially life-threatening condition that can be treated if identified. CAUSES Blood clots form in a vein for different reasons. Usually several things cause blood clots. They include:  The flow of blood slows down.   The inside of the vein is damaged in some way.   The person has a condition that makes the blood clot more easily. These conditions may include:   Older age (especially over 75 years old).   Having a history of blood clots.   Having major or lengthy surgery. Hip surgery is particularly high-risk.   Breaking a hip or leg.   Sitting or lying still for a long time.   Cancer or cancer treatment.   Having a long, thin tube (catheter) placed inside a vein during a medical procedure.   Being overweight (obese).   Pregnancy and childbirth.   Medicines with estrogen.   Smoking.   Other circulation or heart problems.  SYMPTOMS  The symptoms of a PE usually start suddenly and include:  Shortness of breath.   Coughing.   Coughing up blood or blood-tinged mucus (phlegm).   Chest pain. Pain is often worse with deep breaths.   Rapid heartbeat.  DIAGNOSIS  If a PE is suspected, your caregiver will take a medical history and carry out a physical exam. Your caregiver will check for the risk factors listed above. Tests that also may be required include:  Blood tests, including studies of the clotting properties of your blood.   Imaging tests. Ultrasound, CT, MRI, and other tests can all be used to see if you have clots in your legs or lungs. If you have a clot in your legs and have breathing or chest problems, your caregiver may conclude that you have a clot in your lungs. Further lung tests may not be needed.   An EKG can look for heart strain from blood clots in  the lungs.  PREVENTION   Exercise the legs regularly. Take a brisk 30 minute walk every day.   Maintain a weight that is appropriate for your height.   Avoid sitting or lying in bed for long periods of time without moving your legs.   Women, particularly those over the age of 35, should consider the risks and benefits of taking estrogen medicines, including birth control pills.   Do not smoke, especially if you take estrogen medicines.   Long-distance travel can increase your risk. You should exercise your legs by walking or pumping the muscles every hour.   In hospital prevention:   Your caregiver will assess your need for preventive PE care (prophylaxis) when you are admitted to the hospital. If you are having surgery, your surgeon will assess you the day of or day after surgery.   Prevention may include medical and nonmedical measures.  TREATMENT   The most common treatment for a PE is blood thinning (anticoagulant) medicine, which reduces the blood's tendency to clot. Anticoagulants can stop new blood clots from forming and old ones from growing. They cannot dissolve existing clots. Your body does this by itself over time. Anticoagulants can be given by mouth, by intravenous (IV) access, or by injection. Your caregiver will determine the best program for you.   Less commonly, clot-dissolving drugs (thrombolytics) are used to dissolve a PE. They   carry a high risk of bleeding, so they are used mainly in severe cases.   Very rarely, a blood clot in the leg needs to be removed surgically.   If you are unable to take anticoagulants, your caregiver may arrange for you to have a filter placed in a main vein in your belly (abdomen). This filter prevents clots from traveling to your lungs.  HOME CARE INSTRUCTIONS   Take all medicines prescribed by your caregiver. Follow the directions carefully.   You will most likely continue taking anticoagulants after you leave the hospital. Your  caregiver will advise you on the length of treatment (usually 3 to 6 months, sometimes for life).   Taking too much or too little of an anticoagulant is dangerous. While taking this type of medicine, you will need to have regular blood tests to be sure the dose is correct. The dose can change for many reasons. It is critically important that you take this medicine exactly as prescribed and that you have blood tests exactly as directed.   Many foods can interfere with anticoagulants. These include foods high in vitamin K, such as spinach, kale, broccoli, cabbage, collard and turnip greens, Brussels sprouts, peas, cauliflower, seaweed, parsley, beef and pork liver, green tea, and soybean oil. Your caregiver should discuss limits on these foods with you or you should arrange a visit with a dietician to answer your questions.   Many medicines can interfere with anticoagulants. You must tell your caregiver about any and all medicines you take. This includesall vitamins and supplements. Be especially cautious with aspirin and anti-inflammatory medicines. Ask your caregiver before taking these.   Anticoagulants can have side effects, mostly excessive bruising or bleeding. You will need to hold pressure over cuts for longer than usual. Avoid alcoholic drinks or consume only very small amounts while taking this medicine.   If you are taking an anticoagulant:   Wear a medical alert bracelet.   Notify your dentist or other caregivers before procedures.   Avoid contact sports.   Ask your caregiver how soon you can go back to normal activities. Not being active can lead to new clots. Ask for a list of what you should and should not do.   Exercise your lower leg muscles. This is important while traveling.   You may need to wear compression stockings. These are tight elastic stockings that apply pressure to the lower legs. This can help keep the blood in the legs from clotting.   If you are a smoker, you  should quit.   Learn as much as you can about pulmonary embolisms.  SEEK MEDICAL CARE IF:   You notice a rapid heartbeat.   You feel weaker or more tired than usual.   You feel faint.   You notice increased bruising.   Your symptoms are not getting better in the time expected.   You are having side effects of medicine.   You have an oral temperature above 102 F (38.9 C).   You discover other family members with blood clots. This may require further testing for inherited diseases or conditions.  SEEK IMMEDIATE MEDICAL CARE IF:   You have chest pain.   You have trouble breathing.   You have new or increased swelling or pain in one leg.   You cough up blood.   You notice blood in vomit, in a bowel movement, or in urine.   You have an oral temperature above 102 F (38.9 C), not controlled by medicine.    You may have another PE. A blood clot in the lungs is a medical emergency. Call your local emergency services (911 in [place] to get to the nearest hospital or clinic. Do not drive yourself. MAKE SURE YOU:   Understand these instructions.   Will watch your condition.   Will get help right away if you are not doing well or get worse.  Document Released: 02/08/2000 Document Revised: 01/30/2011 Document Reviewed: 08/14/2008 [employer] Patient Information 2012 [employer]. 

## 2011-04-28 NOTE — Progress Notes (Signed)
ANTICOAGULATION CONSULT NOTE - Follow Up Consult  Pharmacy Consult for Heparin/Warfarin Indication: PE  Allergies  Allergen Reactions  . Codeine Other (See Comments)    dizziness    Patient Measurements: Height: 5\' 5"  (165.1 cm) Weight: 136 lb 3.9 oz (61.8 kg) IBW/kg (Calculated) : 57   Vital Signs: Temp: 98.6 F (37 C) (03/04 0501) Temp src: Oral (03/04 0501) BP: 149/80 mmHg (03/04 0501) Pulse Rate: 69  (03/04 0501)  Labs:  Basename 04/28/11 0428 04/27/11 1830 04/27/11 0526 04/26/11 0925  HGB 11.2* -- 12.0 --  HCT 34.4* -- 36.5 38.0  PLT 162 -- 146* 141*  APTT -- -- -- --  LABPROT 13.8 -- 13.5 --  INR 1.04 -- 1.01 --  HEPARINUNFRC 0.64 0.20* 0.40 --  CREATININE -- -- 0.93 0.91  CKTOTAL -- -- -- 45  CKMB -- -- -- 1.5  TROPONINI -- -- -- <0.30   Estimated Creatinine Clearance: 43.4 ml/min (by C-G formula based on Cr of 0.93).   Medications:  Scheduled:     . atenolol  50 mg Oral Daily  . heparin  1,000 Units Intravenous Once  . pantoprazole  40 mg Oral Q1200  . simvastatin  20 mg Oral QHS  . sodium chloride  3 mL Intravenous Q12H  . sodium chloride  3 mL Intravenous Q12H  . warfarin  5 mg Oral ONCE-1800  . Warfarin - Pharmacist Dosing Inpatient   Does not apply q1800   Infusions:     . heparin 1,100 Units/hr (04/28/11 0457)  . DISCONTD: heparin 15 Units/kg/hr (04/27/11 0500)    Assessment:  78 yo AAF with hx of AFib, no anticoagulation due to previous GI bleed. Familiar with Warfarin  New PE, likely from DVT RLE-hx RLE swelling resolved.  Day 3/5 minimum overlap Heparin/Coumadin. Will require 2 days of therapeutic INR before Heparin can be d/c.  Heparin level therapeutic on 1100 units/hr  No bleeding reported  Goal of Therapy:  INR 2-3 Heparin level 0.3-0.7 units/ml   Plan:   Continue heparin at 1100 units/hr  Repeat heparin level in 8 hr to confirm therapeutic dose  Repeat Coumadin 5mg  po x 1 today  Sara Butler, Joselyn Glassman  PharmD 04/28/2011,5:16 AM

## 2011-04-28 NOTE — Progress Notes (Signed)
Discharge to home, alert and oriented, no distress noted, no complaints of any pain or discomfort. D/C home instructions and follow up appointment done and discussed with patient. Lovenox education done, patient was able to self inject without difficulty. PIV removed no s/s of infiltration or swelling noted.

## 2011-06-23 ENCOUNTER — Other Ambulatory Visit: Payer: Self-pay | Admitting: Internal Medicine

## 2011-07-30 ENCOUNTER — Emergency Department (HOSPITAL_COMMUNITY)
Admission: EM | Admit: 2011-07-30 | Discharge: 2011-07-30 | Disposition: A | Discharge status: Home / Self Care | Payer: Medicare HMO | Attending: Emergency Medicine | Admitting: Emergency Medicine

## 2011-07-30 ENCOUNTER — Emergency Department (HOSPITAL_COMMUNITY): Payer: Medicare HMO

## 2011-07-30 ENCOUNTER — Encounter (HOSPITAL_COMMUNITY): Payer: Self-pay

## 2011-07-30 DIAGNOSIS — E785 Hyperlipidemia, unspecified: Secondary | ICD-10-CM | POA: Insufficient documentation

## 2011-07-30 DIAGNOSIS — I4891 Unspecified atrial fibrillation: Secondary | ICD-10-CM | POA: Insufficient documentation

## 2011-07-30 DIAGNOSIS — K219 Gastro-esophageal reflux disease without esophagitis: Secondary | ICD-10-CM | POA: Insufficient documentation

## 2011-07-30 DIAGNOSIS — R29898 Other symptoms and signs involving the musculoskeletal system: Secondary | ICD-10-CM | POA: Insufficient documentation

## 2011-07-30 DIAGNOSIS — M79609 Pain in unspecified limb: Secondary | ICD-10-CM | POA: Insufficient documentation

## 2011-07-30 DIAGNOSIS — I10 Essential (primary) hypertension: Secondary | ICD-10-CM | POA: Insufficient documentation

## 2011-07-30 DIAGNOSIS — M79601 Pain in right arm: Secondary | ICD-10-CM

## 2011-07-30 DIAGNOSIS — R51 Headache: Secondary | ICD-10-CM | POA: Insufficient documentation

## 2011-07-30 LAB — COMPREHENSIVE METABOLIC PANEL
ALT: 15 U/L (ref 0–35)
BUN: 14 mg/dL (ref 6–23)
CO2: 24 mEq/L (ref 19–32)
Calcium: 9.4 mg/dL (ref 8.4–10.5)
Creatinine, Ser: 0.9 mg/dL (ref 0.50–1.10)
GFR calc Af Amer: 68 mL/min — ABNORMAL LOW (ref >90)
GFR calc non Af Amer: 59 mL/min — ABNORMAL LOW (ref >90)
Glucose, Bld: 88 mg/dL (ref 70–99)

## 2011-07-30 LAB — DIFFERENTIAL
Eosinophils Relative: 3 % (ref 0–5)
Lymphocytes Relative: 32 % (ref 12–46)
Lymphs Abs: 1.4 10*3/uL (ref 0.7–4.0)
Monocytes Absolute: 0.3 10*3/uL (ref 0.1–1.0)
Monocytes Relative: 7 % (ref 3–12)

## 2011-07-30 LAB — URINALYSIS, ROUTINE W REFLEX MICROSCOPIC
Bilirubin Urine: NEGATIVE
Nitrite: NEGATIVE
Specific Gravity, Urine: 1.01 (ref 1.005–1.030)
Urobilinogen, UA: 0.2 mg/dL (ref 0.0–1.0)

## 2011-07-30 LAB — CBC
HCT: 40 % (ref 36.0–46.0)
MCH: 27.9 pg (ref 26.0–34.0)
MCV: 87.3 fL (ref 78.0–100.0)
RBC: 4.58 MIL/uL (ref 3.87–5.11)
WBC: 4.4 10*3/uL (ref 4.0–10.5)

## 2011-07-30 LAB — PROTIME-INR: INR: 1.79 — ABNORMAL HIGH (ref 0.00–1.49)

## 2011-07-30 NOTE — Discharge Instructions (Signed)
General Headache, Without Cause A general headache has no specific cause. These headaches are not life-threatening. They will not lead to other types of headaches. HOME CARE   Make and keep follow-up visits with your doctor.   Only take medicine as told by your doctor.   Try to relax, get a massage, or use your thoughts to control your body (biofeedback).   Apply cold or heat to the head and neck. Apply 3 or 4 times a day or as needed.  Finding out the results of your test Ask when your test results will be ready. Make sure you get your test results. GET HELP RIGHT AWAY IF:   You have problems with medicine.   Your medicine does not help relieve pain.   Your headache changes or becomes worse.   You feel sick to your stomach (nauseous) or throw up (vomit).   You have a temperature by mouth above 102 F (38.9 C), not controlled by medicine.   Your have a stiff neck.   You have vision loss.   You have muscle weakness.   You lose control of your muscles.   You lose balance or have trouble walking.   You feel like you are going to pass out (faint).  MAKE SURE YOU:   Understand these instructions.   Will watch this condition.   Will get help right away if you are not doing well or get worse.  Document Released: 11/20/2007 Document Revised: 01/30/2011 Document Reviewed: 11/20/2007 [employer] Patient Information 2012 [employer]. 

## 2011-07-30 NOTE — ED Notes (Signed)
Patient reports that she began having left leg weakness 3 days ago and last night began having tingling in her right arm. Ppatient called her PCP's office and was advised to come to the ED. Patient has a history blood clot April 2013 and is currently taking Coumadin. Patient has also been having a headache and lightheadedness today. Tongue midline, smile symmetrical, no hand drift, able to raise each leg up without difficulty.

## 2011-07-30 NOTE — ED Provider Notes (Addendum)
History     CSN: [id number]  Arrival date & time 07/30/11  1351   First MD Initiated Contact with Patient 07/30/11 1505      Chief Complaint  Patient presents with  . Extremity Weakness  . Headache    (Consider location/radiation/quality/duration/timing/severity/associated sxs/prior treatment) HPI Pt with R arm pain and throbbing starting last night. No trauma. Pain improved with Tylenol. No numbness. States she has had HA since being placed on Coumadin. She also reports several day history of LLE "giving out". Recently dx with multiple PE's. No fever chills, abd pain.  Past Medical History  Diagnosis Date  . Hypertension   . Cervical cancer   . Atrial fibrillation   . Bleeding from anus   . Diverticulitis of colon   . Hyperlipidemia   . GERD (gastroesophageal reflux disease)     Past Surgical History  Procedure Date  . Abdominal hysterectomy   . Back surgery     Family History  Problem Relation Age of Onset  . Diabetes Brother     History  Substance Use Topics  . Smoking status: Former Smoker -- 0.1 packs/day for 58 years    Types: Cigarettes  . Smokeless tobacco: Never Used  . Alcohol Use: No    OB History    Grav Para Term Preterm Abortions TAB SAB Ect Mult Living                  Review of Systems  Constitutional: Negative for fever, chills and fatigue.  Respiratory: Negative for cough and shortness of breath.   Cardiovascular: Negative for chest pain, palpitations and leg swelling.  Gastrointestinal: Negative for nausea, vomiting and abdominal pain.  Genitourinary: Negative for dysuria and flank pain.  Musculoskeletal: Negative for back pain and joint swelling.  Skin: Negative for rash.  Neurological: Positive for headaches. Negative for dizziness, weakness, light-headedness and numbness.    Allergies  Codeine  Home Medications   Current Outpatient Rx  Name Route Sig Dispense Refill  . ACETAMINOPHEN 325 MG PO TABS Oral Take 325 mg by mouth  every 6 (six) hours as needed. For headache    . ATENOLOL 50 MG PO TABS Oral Take 50 mg by mouth daily.     Marland Kitchen LACTINEX PO CHEW Oral Chew 1 tablet by mouth daily as needed. Only when she eat dairy products    . LIDOCAINE 5 % EX PTCH Transdermal Place 1 patch onto the skin as needed. Remove & Discard patch within 12 hours or as directed by MD    . WARFARIN SODIUM 5 MG PO TABS Oral Take 5 mg by mouth daily.       BP 181/90  Pulse 59  Temp(Src) 98 F (36.7 C) (Oral)  Resp 16  Ht 5\' 5"  (1.651 m)  Wt 142 lb (64.411 kg)  BMI 23.63 kg/m2  SpO2 100%  Physical Exam  Nursing note and vitals reviewed. Constitutional: She is oriented to person, place, and time. She appears well-developed and well-nourished. No distress.  HENT:  Head: Normocephalic and atraumatic.  Mouth/Throat: Oropharynx is clear and moist. No oropharyngeal exudate.  Eyes: EOM are normal. Pupils are equal, round, and reactive to light.  Neck: Normal range of motion. Neck supple.       No midline tenderness, evidence of trauma  Cardiovascular: Normal rate and regular rhythm.   Pulmonary/Chest: Effort normal and breath sounds normal. No respiratory distress. She has no wheezes. She has no rales.  Abdominal: Soft. Bowel sounds are normal.  There is no tenderness. There is no rebound and no guarding.  Musculoskeletal: Normal range of motion. She exhibits no edema and no tenderness.  Neurological: She is alert and oriented to person, place, and time. No cranial nerve deficit. Coordination normal.       CN II-XII intact, 5/5 motor in all ext, no drift. Sensation intact, finger-to -nose intact.   Skin: Skin is warm and dry. No rash noted. No erythema.  Psychiatric: She has a normal mood and affect. Her behavior is normal.    ED Course  Procedures (including critical care time)  Labs Reviewed  COMPREHENSIVE METABOLIC PANEL - Abnormal; Notable for the following:    Albumin 3.2 (*)    GFR calc non Af Amer 59 (*)    GFR calc Af  Amer 68 (*)    All other components within normal limits  URINALYSIS, ROUTINE W REFLEX MICROSCOPIC - Abnormal; Notable for the following:    Leukocytes, UA TRACE (*)    All other components within normal limits  PROTIME-INR - Abnormal; Notable for the following:    Prothrombin Time 21.1 (*)    INR 1.79 (*)    All other components within normal limits  CBC  DIFFERENTIAL  APTT  URINE MICROSCOPIC-ADD ON   Ct Head Wo Contrast  07/30/2011  *RADIOLOGY REPORT*  Clinical Data: Left leg weakness 3 days ago.  Recent onset of right arm tingling.  On Coumadin.  CT HEAD WITHOUT CONTRAST  Technique:  Contiguous axial images were obtained from the base of the skull through the vertex without contrast.  Comparison: 12/07/2003.  Findings: No intracranial hemorrhage.  Global atrophy without hydrocephalus.  Remote left occipital lobe infarct with encephalomalacia.  Small vessel disease type changes.  No CT evidence of large acute infarct.  No intracranial mass lesion detected on this unenhanced exam.  Mild vascular calcifications.  Visualized paranasal sinuses, mastoid air cells and middle ear cavities are clear.  IMPRESSION: No intracranial hemorrhage or CT evidence of large acute infarct. Please see above.  Original Report Authenticated By: Fuller Canada, M.D.     1. Right arm pain   2. HA (headache)      Date: 08/30/2011  Rate: 57  Rhythm: sinus bradycardia  QRS Axis: normal  Intervals: normal  ST/T Wave abnormalities: normal  Conduction Disutrbances:none  Narrative Interpretation:   Old EKG Reviewed: none available    MDM  Pt is asymptomatic in ED. Advised to f/u with PMD. Suspect possible radiculopathy as source for pain. Return for worsening pain or any concerns        Loren Racer, MD 07/30/11 1755  Loren Racer, MD 08/30/11 [phone]

## 2011-11-28 ENCOUNTER — Other Ambulatory Visit: Payer: Self-pay | Admitting: Family Medicine

## 2011-11-28 DIAGNOSIS — M545 Low back pain: Secondary | ICD-10-CM

## 2011-12-01 ENCOUNTER — Ambulatory Visit
Admission: RE | Admit: 2011-12-01 | Discharge: 2011-12-01 | Disposition: A | Discharge status: Home / Self Care | Payer: Medicare HMO | Source: Ambulatory Visit | Attending: Family Medicine | Admitting: Family Medicine

## 2011-12-01 DIAGNOSIS — M545 Low back pain: Secondary | ICD-10-CM

## 2012-01-13 ENCOUNTER — Ambulatory Visit: Payer: Medicare HMO | Admitting: Physical Therapy

## 2012-11-11 ENCOUNTER — Emergency Department (HOSPITAL_COMMUNITY): Payer: Medicare HMO

## 2012-11-11 ENCOUNTER — Emergency Department (HOSPITAL_COMMUNITY)
Admission: EM | Admit: 2012-11-11 | Discharge: 2012-11-11 | Disposition: A | Discharge status: Home / Self Care | Payer: Medicare HMO | Attending: Emergency Medicine | Admitting: Emergency Medicine

## 2012-11-11 ENCOUNTER — Encounter (HOSPITAL_COMMUNITY): Payer: Self-pay | Admitting: Emergency Medicine

## 2012-11-11 DIAGNOSIS — I10 Essential (primary) hypertension: Secondary | ICD-10-CM | POA: Insufficient documentation

## 2012-11-11 DIAGNOSIS — Z8639 Personal history of other endocrine, nutritional and metabolic disease: Secondary | ICD-10-CM | POA: Insufficient documentation

## 2012-11-11 DIAGNOSIS — Z8541 Personal history of malignant neoplasm of cervix uteri: Secondary | ICD-10-CM | POA: Insufficient documentation

## 2012-11-11 DIAGNOSIS — Z8719 Personal history of other diseases of the digestive system: Secondary | ICD-10-CM | POA: Insufficient documentation

## 2012-11-11 DIAGNOSIS — Z862 Personal history of diseases of the blood and blood-forming organs and certain disorders involving the immune mechanism: Secondary | ICD-10-CM | POA: Insufficient documentation

## 2012-11-11 DIAGNOSIS — Z87891 Personal history of nicotine dependence: Secondary | ICD-10-CM | POA: Insufficient documentation

## 2012-11-11 DIAGNOSIS — N390 Urinary tract infection, site not specified: Secondary | ICD-10-CM

## 2012-11-11 DIAGNOSIS — R110 Nausea: Secondary | ICD-10-CM | POA: Insufficient documentation

## 2012-11-11 DIAGNOSIS — Z79899 Other long term (current) drug therapy: Secondary | ICD-10-CM | POA: Insufficient documentation

## 2012-11-11 LAB — COMPREHENSIVE METABOLIC PANEL
Alkaline Phosphatase: 54 U/L (ref 39–117)
BUN: 10 mg/dL (ref 6–23)
CO2: 26 mEq/L (ref 19–32)
Chloride: 103 mEq/L (ref 96–112)
GFR calc Af Amer: 65 mL/min — ABNORMAL LOW (ref >90)
GFR calc non Af Amer: 56 mL/min — ABNORMAL LOW (ref >90)
Glucose, Bld: 101 mg/dL — ABNORMAL HIGH (ref 70–99)
Potassium: 3.7 mEq/L (ref 3.5–5.1)
Total Bilirubin: 0.9 mg/dL (ref 0.3–1.2)

## 2012-11-11 LAB — CBC WITH DIFFERENTIAL/PLATELET
Eosinophils Absolute: 0.2 10*3/uL (ref 0.0–0.7)
Hemoglobin: 14.5 g/dL (ref 12.0–15.0)
Lymphs Abs: 3 10*3/uL (ref 0.7–4.0)
MCH: 28.2 pg (ref 26.0–34.0)
Monocytes Relative: 7 % (ref 3–12)
Neutro Abs: 4.1 10*3/uL (ref 1.7–7.7)
Neutrophils Relative %: 52 % (ref 43–77)
RBC: 5.14 MIL/uL — ABNORMAL HIGH (ref 3.87–5.11)

## 2012-11-11 LAB — URINALYSIS, ROUTINE W REFLEX MICROSCOPIC
Bilirubin Urine: NEGATIVE
Ketones, ur: NEGATIVE mg/dL
Nitrite: NEGATIVE
Urobilinogen, UA: 0.2 mg/dL (ref 0.0–1.0)

## 2012-11-11 LAB — LIPASE, BLOOD: Lipase: 26 U/L (ref 11–59)

## 2012-11-11 LAB — PROTIME-INR: Prothrombin Time: 21.7 seconds — ABNORMAL HIGH (ref 11.6–15.2)

## 2012-11-11 MED ORDER — ONDANSETRON HCL 4 MG/2ML IJ SOLN
4.0000 mg | Freq: Once | INTRAMUSCULAR | Status: AC
  Administered 2012-11-11: 4 mg via INTRAVENOUS
  Filled 2012-11-11: qty 2

## 2012-11-11 MED ORDER — MORPHINE SULFATE 4 MG/ML IJ SOLN
4.0000 mg | Freq: Once | INTRAMUSCULAR | Status: AC
  Administered 2012-11-11: 4 mg via INTRAVENOUS
  Filled 2012-11-11: qty 1

## 2012-11-11 MED ORDER — CEPHALEXIN 500 MG PO CAPS: 500.0000 mg | ORAL_CAPSULE | Freq: Four times a day (QID) | ORAL | Status: DC

## 2012-11-11 MED ORDER — SODIUM CHLORIDE 0.9 % IV BOLUS (SEPSIS)
1000.0000 mL | Freq: Once | INTRAVENOUS | Status: AC
  Administered 2012-11-11: 1000 mL via INTRAVENOUS

## 2012-11-11 NOTE — ED Provider Notes (Signed)
CSN: [id number]     Arrival date & time 11/11/12  1515 History   First MD Initiated Contact with Patient 11/11/12 1552     Chief Complaint  Patient presents with  . Abdominal Pain   (Consider location/radiation/quality/duration/timing/severity/associated sxs/prior Treatment) HPI Comments: Patient is an 79 year old female with history of hypertension, Cervical cancer, A. Fib, diverticulitis, hyperlipidemia, GERD who presents today with a burning epigastric pain since 8 PM last night. She reports she is getting ready to go to bed when her pain began. It is associated with nausea. She denies any shortness of breath, chest pain, diaphoresis. The pain radiates into her back. She reports that when it radiates to her back it is an aching pain. Her last bowel movement was yesterday. She reports there is no gross blood or darkening of her stool. She has not had a bowel movement yet today. Nothing has made her pain better. Nothing makes her pain worse. She ate some sweet Ritz crackers today.   The history is provided by the patient. No language interpreter was used.    Past Medical History  Diagnosis Date  . Hypertension   . Cervical cancer   . Atrial fibrillation   . Bleeding from anus   . Diverticulitis of colon   . Hyperlipidemia   . GERD (gastroesophageal reflux disease)    Past Surgical History  Procedure Laterality Date  . Abdominal hysterectomy    . Back surgery     Family History  Problem Relation Age of Onset  . Diabetes Brother    History  Substance Use Topics  . Smoking status: Former Smoker -- 0.10 packs/day for 58 years    Types: Cigarettes  . Smokeless tobacco: Never Used  . Alcohol Use: No   OB History   Grav Para Term Preterm Abortions TAB SAB Ect Mult Living                 Review of Systems  Constitutional: Negative for fever and chills.  Respiratory: Negative for shortness of breath.   Cardiovascular: Negative for chest pain.  Gastrointestinal: Positive for  nausea and abdominal pain. Negative for vomiting, diarrhea and constipation.  All other systems reviewed and are negative.    Allergies  Codeine  Home Medications   Current Outpatient Rx  Name  Route  Sig  Dispense  Refill  . acetaminophen (TYLENOL) 325 MG tablet   Oral   Take 325 mg by mouth every 6 (six) hours as needed. For headache         . atenolol (TENORMIN) 50 MG tablet   Oral   Take 50 mg by mouth daily.          Marland Kitchen lactobacillus acidophilus & bulgar (LACTINEX) chewable tablet   Oral   Chew 1 tablet by mouth daily as needed. Only when she eat dairy products         . lidocaine (LIDODERM) 5 %   Transdermal   Place 1 patch onto the skin as needed. Remove & Discard patch within 12 hours or as directed by MD         . EXPIRED: warfarin (COUMADIN) 5 MG tablet   Oral   Take 5 mg by mouth daily.           BP 161/94  Pulse 69  Temp(Src) 99.3 F (37.4 C) (Oral)  Resp 18  SpO2 97% Physical Exam  Nursing note and vitals reviewed. Constitutional: She is oriented to person, place, and time. She appears well-developed  and well-nourished. No distress.  HENT:  Head: Normocephalic and atraumatic.  Right Ear: External ear normal.  Left Ear: External ear normal.  Nose: Nose normal.  Mouth/Throat: Oropharynx is clear and moist.  Eyes: Conjunctivae are normal.  Neck: Normal range of motion.  Cardiovascular: Normal rate, regular rhythm and normal heart sounds.   Pulmonary/Chest: Effort normal and breath sounds normal. No stridor. No respiratory distress. She has no wheezes. She has no rales.  Abdominal: Soft. Bowel sounds are normal. She exhibits no distension. There is generalized tenderness. There is no rebound and no guarding.  Maximal tenderness in epigastric area  Musculoskeletal: Normal range of motion.  Neurological: She is alert and oriented to person, place, and time. She has normal strength.  Skin: Skin is warm and dry. She is not diaphoretic. No  erythema.  Psychiatric: She has a normal mood and affect. Her behavior is normal.    ED Course  Procedures (including critical care time) Labs Review Labs Reviewed  PROTIME-INR - Abnormal; Notable for the following:    Prothrombin Time 21.7 (*)    INR 1.96 (*)    All other components within normal limits  CBC WITH DIFFERENTIAL - Abnormal; Notable for the following:    RBC 5.14 (*)    All other components within normal limits  COMPREHENSIVE METABOLIC PANEL - Abnormal; Notable for the following:    Glucose, Bld 101 (*)    GFR calc non Af Amer 56 (*)    GFR calc Af Amer 65 (*)    All other components within normal limits  URINALYSIS, ROUTINE W REFLEX MICROSCOPIC - Abnormal; Notable for the following:    Leukocytes, UA MODERATE (*)    All other components within normal limits  LIPASE, BLOOD  URINE MICROSCOPIC-ADD ON  POCT I-STAT TROPONIN I   Imaging Review Dg Chest 2 View  11/11/2012   CLINICAL DATA:  Chest pain  EXAM: CHEST  2 VIEW  COMPARISON:  April 26, 2011  FINDINGS: Normal heart size. Normal vascularity. Lungs are clear without infiltrate effusion or mass.  IMPRESSION: No active cardiopulmonary disease.   Electronically Signed   By: Marlan Palau M.D.   On: 11/11/2012 16:43    MDM   1. UTI (lower urinary tract infection)    Patient is nontoxic, nonseptic appearing, in no apparent distress.  Patient's pain and other symptoms adequately managed in emergency department.  Fluid bolus given.  Labs, imaging and vitals reviewed.  Patient does not meet the SIRS or Sepsis criteria.  On repeat exam patient does not have a surgical abdomen and there are no peritoneal signs.  No indication of appendicitis, bowel obstruction, bowel perforation, cholecystitis, diverticulitis, PID or ectopic pregnancy. No concern for dissecting aneurysm. Pt has been diagnosed with a UTI. Pt is afebrile, no CVA tenderness, normotensive, and denies N/V. Pt to be dc home with antibiotics and instructions to  follow up with PCP if symptoms persist. Dr. Jeraldine Loots evaluated patient and agrees with plan.    Mora Bellman, PA-C 11/11/12 [phone]

## 2012-11-11 NOTE — ED Notes (Signed)
Patient transported to X-ray 

## 2012-11-11 NOTE — ED Notes (Signed)
Per pt, epigastric pain which started yesterday, feels like a knot-radiates down abdomin and sometimes to back-took fiber laxative but has no relief-does not know what is going on

## 2012-11-11 NOTE — ED Provider Notes (Signed)
  This was a shared visit with a mid-level provided (NP or PA).  Throughout the patient's course I was available for consultation/collaboration.  I saw the ECG (if appropriate), relevant labs and studies - I agree with the interpretation.  On my exam the patient was in no distress.  With her description of abdominal pain radiating towards the back, she had a laboratory evaluation. Her workup was largely reassuring, and she was discharged in stable condition.      Gerhard Munch, MD 11/11/12 [phone]

## 2012-11-11 NOTE — ED Notes (Signed)
Pt states last night around 8 pm started having mid to upper abdominal burning, denies nausea or vomiting, states today at times has felt like she needed to vomit, pt denies diarrhea, states last BM was yesterday, pt states having aching in her back, denies chest pain or shortness of breath, states has a dull frontal headache.

## 2013-02-07 ENCOUNTER — Encounter (HOSPITAL_COMMUNITY): Payer: Self-pay | Admitting: Emergency Medicine

## 2013-02-07 ENCOUNTER — Emergency Department (HOSPITAL_COMMUNITY)
Admission: EM | Admit: 2013-02-07 | Discharge: 2013-02-07 | Disposition: A | Discharge status: Home / Self Care | Payer: Medicare HMO | Attending: Emergency Medicine | Admitting: Emergency Medicine

## 2013-02-07 ENCOUNTER — Emergency Department (HOSPITAL_COMMUNITY): Payer: Medicare HMO

## 2013-02-07 DIAGNOSIS — Z7901 Long term (current) use of anticoagulants: Secondary | ICD-10-CM | POA: Insufficient documentation

## 2013-02-07 DIAGNOSIS — Z8541 Personal history of malignant neoplasm of cervix uteri: Secondary | ICD-10-CM | POA: Insufficient documentation

## 2013-02-07 DIAGNOSIS — R1013 Epigastric pain: Secondary | ICD-10-CM | POA: Insufficient documentation

## 2013-02-07 DIAGNOSIS — Z87891 Personal history of nicotine dependence: Secondary | ICD-10-CM | POA: Insufficient documentation

## 2013-02-07 DIAGNOSIS — K625 Hemorrhage of anus and rectum: Secondary | ICD-10-CM | POA: Insufficient documentation

## 2013-02-07 DIAGNOSIS — Z79899 Other long term (current) drug therapy: Secondary | ICD-10-CM | POA: Insufficient documentation

## 2013-02-07 DIAGNOSIS — Z86711 Personal history of pulmonary embolism: Secondary | ICD-10-CM | POA: Insufficient documentation

## 2013-02-07 DIAGNOSIS — Z90710 Acquired absence of both cervix and uterus: Secondary | ICD-10-CM | POA: Insufficient documentation

## 2013-02-07 DIAGNOSIS — I4891 Unspecified atrial fibrillation: Secondary | ICD-10-CM | POA: Insufficient documentation

## 2013-02-07 DIAGNOSIS — E785 Hyperlipidemia, unspecified: Secondary | ICD-10-CM | POA: Insufficient documentation

## 2013-02-07 DIAGNOSIS — Z792 Long term (current) use of antibiotics: Secondary | ICD-10-CM | POA: Insufficient documentation

## 2013-02-07 DIAGNOSIS — I10 Essential (primary) hypertension: Secondary | ICD-10-CM | POA: Insufficient documentation

## 2013-02-07 HISTORY — DX: Other pulmonary embolism without acute cor pulmonale: I26.99

## 2013-02-07 LAB — OCCULT BLOOD, POC DEVICE: Fecal Occult Bld: POSITIVE — AB

## 2013-02-07 LAB — HEPATIC FUNCTION PANEL
ALT: 14 U/L (ref 0–35)
Alkaline Phosphatase: 52 U/L (ref 39–117)
Indirect Bilirubin: 0.7 mg/dL (ref 0.3–0.9)
Total Protein: 6.8 g/dL (ref 6.0–8.3)

## 2013-02-07 LAB — CBC
HCT: 37.7 % (ref 36.0–46.0)
MCV: 86.5 fL (ref 78.0–100.0)
Platelets: 168 10*3/uL (ref 150–400)
RBC: 4.36 MIL/uL (ref 3.87–5.11)
WBC: 5.7 10*3/uL (ref 4.0–10.5)

## 2013-02-07 LAB — BASIC METABOLIC PANEL
BUN: 10 mg/dL (ref 6–23)
CO2: 24 mEq/L (ref 19–32)
Chloride: 107 mEq/L (ref 96–112)
Creatinine, Ser: 0.97 mg/dL (ref 0.50–1.10)

## 2013-02-07 MED ORDER — IOHEXOL 300 MG/ML  SOLN
80.0000 mL | Freq: Once | INTRAMUSCULAR | Status: AC | PRN
  Administered 2013-02-07: 80 mL via INTRAVENOUS

## 2013-02-07 MED ORDER — IOHEXOL 300 MG/ML  SOLN
50.0000 mL | Freq: Once | INTRAMUSCULAR | Status: AC | PRN
  Administered 2013-02-07: 50 mL via ORAL

## 2013-02-07 MED ORDER — PANTOPRAZOLE SODIUM 40 MG IV SOLR
40.0000 mg | Freq: Once | INTRAVENOUS | Status: AC
  Administered 2013-02-07: 40 mg via INTRAVENOUS
  Filled 2013-02-07: qty 40

## 2013-02-07 MED ORDER — SODIUM CHLORIDE 0.9 % IV BOLUS (SEPSIS)
1000.0000 mL | Freq: Once | INTRAVENOUS | Status: AC
  Administered 2013-02-07: 1000 mL via INTRAVENOUS

## 2013-02-07 NOTE — ED Notes (Signed)
Pt states she had a bowel movement with bright red blood in it this morning. Pt states there was about 1/4 cup of blood in her BM. Pt states she has lower abdominal pain now. Pt states pain is constant and dull. Pt states she has a hx of diverticulitis and PE. Pt is concerned about a PE because she has a small cramp to her upper L thigh area. Pt alert, with no acute distress. Skin is warm and dry. Pt denies chest pain or shortness of breath. Pt states she does have some "reflux pain" however.

## 2013-02-07 NOTE — ED Provider Notes (Signed)
CSN: [id number]     Arrival date & time 02/07/13  1634 History   First MD Initiated Contact with Patient 02/07/13 1704     Chief Complaint  Patient presents with  . Rectal Bleeding   (Consider location/radiation/quality/duration/timing/severity/associated sxs/prior Treatment) HPI Comments: 79 year old female presents after having an episode of bright red blood per rectum this morning. States it was approximately a quarter of a cup. She has a history of diverticulosis and had similar bleeding several years ago. She has also had some lower and upper abdominal pain since this started this AM. Denies lightheadedness, vomiting or chest pain/dyspnea. Is currently on coumadin, last INR was 3.4 last week.    Past Medical History  Diagnosis Date  . Hypertension   . Cervical cancer   . Atrial fibrillation   . Bleeding from anus   . Diverticulitis of colon   . Hyperlipidemia   . GERD (gastroesophageal reflux disease)   . Pulmonary embolism    Past Surgical History  Procedure Laterality Date  . Abdominal hysterectomy    . Back surgery     Family History  Problem Relation Age of Onset  . Diabetes Brother    History  Substance Use Topics  . Smoking status: Former Smoker -- 0.10 packs/day for 58 years    Types: Cigarettes  . Smokeless tobacco: Never Used  . Alcohol Use: No   OB History   Grav Para Term Preterm Abortions TAB SAB Ect Mult Living                 Review of Systems  Constitutional: Negative for fever.  Respiratory: Negative for shortness of breath.   Cardiovascular: Negative for chest pain.  Gastrointestinal: Positive for abdominal pain, blood in stool and hematochezia. Negative for nausea, vomiting, diarrhea and constipation.  Neurological: Negative for weakness and light-headedness.  All other systems reviewed and are negative.    Allergies  Codeine  Home Medications   Current Outpatient Rx  Name  Route  Sig  Dispense  Refill  . acetaminophen (TYLENOL) 500  MG tablet   Oral   Take 500 mg by mouth every 6 (six) hours as needed for pain.         Marland Kitchen atenolol (TENORMIN) 50 MG tablet   Oral   Take 50 mg by mouth daily.          . cephALEXin (KEFLEX) 500 MG capsule   Oral   Take 1 capsule (500 mg total) by mouth 4 (four) times daily.   40 capsule   0   . cholecalciferol (VITAMIN D) 1000 UNITS tablet   Oral   Take 1,000 Units by mouth daily.         . nitroGLYCERIN (NITROSTAT) 0.4 MG SL tablet   Sublingual   Place 0.4 mg under the tongue every 5 (five) minutes as needed for chest pain.         Marland Kitchen warfarin (COUMADIN) 5 MG tablet   Oral   Take 5-7.5 mg by mouth daily. On Monday Wednesday and Friday take 1 1/2 tablet (7.5mg ) and on all other days take 1 tablet (5mg )          BP 160/83  Pulse 79  Temp(Src) 98.3 F (36.8 C) (Oral)  Resp 16  SpO2 99% Physical Exam  Nursing note and vitals reviewed. Constitutional: She is oriented to person, place, and time. She appears well-developed and well-nourished.  HENT:  Head: Normocephalic and atraumatic.  Right Ear: External ear normal.  Left  Ear: External ear normal.  Nose: Nose normal.  Eyes: Right eye exhibits no discharge. Left eye exhibits no discharge.  Cardiovascular: Normal rate, regular rhythm and normal heart sounds.   Pulmonary/Chest: Effort normal and breath sounds normal.  Abdominal: Soft. She exhibits no distension. There is tenderness in the epigastric area.  Genitourinary: Guaiac positive stool (normal appearing stool but heme positive).  Neurological: She is alert and oriented to person, place, and time. She has normal strength. No sensory deficit.  Skin: Skin is warm and dry.    ED Course  Procedures (including critical care time) Labs Review Labs Reviewed  BASIC METABOLIC PANEL - Abnormal; Notable for the following:    GFR calc non Af Amer 53 (*)    GFR calc Af Amer 61 (*)    All other components within normal limits  HEPATIC FUNCTION PANEL - Abnormal;  Notable for the following:    Albumin 3.4 (*)    All other components within normal limits  PROTIME-INR - Abnormal; Notable for the following:    Prothrombin Time 19.5 (*)    INR 1.70 (*)    All other components within normal limits  OCCULT BLOOD, POC DEVICE - Abnormal; Notable for the following:    Fecal Occult Bld POSITIVE (*)    All other components within normal limits  CBC  LIPASE, BLOOD  CG4 I-STAT (LACTIC ACID)   Imaging Review Ct Head Wo Contrast  02/07/2013   CLINICAL DATA:  Headache.  On Coumadin.  EXAM: CT HEAD WITHOUT CONTRAST  TECHNIQUE: Contiguous axial images were obtained from the base of the skull through the vertex without intravenous contrast.  COMPARISON:  07/30/2011.  FINDINGS: No intracranial hemorrhage.  Remote left occipital lobe infarct with encephalomalacia. Small vessel disease type changes. No CT evidence of large acute infarct  No intracranial mass lesion noted on this unenhanced exam.  Mild atrophy without hydrocephalus.  Visualized paranasal sinuses, mastoid air cells and middle ear cavities are clear.  IMPRESSION: No intracranial hemorrhage or CT evidence of large acute infarct. Please see above.   Electronically Signed   By: Bridgett Larsson M.D.   On: 02/07/2013 18:19   Ct Abdomen Pelvis W Contrast  02/07/2013   CLINICAL DATA:  Bright red blood per rectum today, history diverticulitis, hypertension, cancer of the cervix, GERD  EXAM: CT ABDOMEN AND PELVIS WITH CONTRAST  TECHNIQUE: Multidetector CT imaging of the abdomen and pelvis was performed using the standard protocol following bolus administration of intravenous contrast. Sagittal and coronal MPR images reconstructed from axial data set.  CONTRAST:  80mL OMNIPAQUE IOHEXOL 300 MG/ML SOLN. Dilute oral contrast.  COMPARISON:  01/21/2010 virtual colonoscopy, CT abdomen and pelvis 10/28/2009  FINDINGS: Small posterior right diaphragmatic hernia containing fat with scattered areas of capsular and subcapsular  enhancement, could be related to transient hepatic attenuation difference though at the most prominence but the mid liver a prominent vessel is seen extending to the liver surface raising question of a fistula or vascular anomaly.  Chronic capsular and subcapsular enhancement liver has been present since an earlier exam of 02/20/2007.  Remainder of liver, spleen, pancreas, kidneys, and adrenal glands normal appearance.  Chronic thickening of the sigmoid colon and rectum as well as mild thickening of a small bowel loop in the pelvis which appears relatively fixed in position versus previous exams, findings which may be related to prior pelvic irradiation.  Uterus surgically absent with nonvisualization of ovaries.  Small amount of nonspecific free pelvic fluid.  Few uncomplicated  sigmoid diverticula again noted.  Enhancing lymph nodes are seen in the inguinal regions bilaterally grossly unchanged.  Low-lying bladder question pelvic floor relaxation.  Normal appendix seen in right pelvis.  Skin and soft tissue thickening at perineum question related to radiation therapy.  No additional mass, adenopathy, free fluid or inflammatory process.  Bones demineralized with questionable pars radiation therapy changes at the lower lumbar spine in pelvis.  Scattered degenerative disc and facet disease changes of the thoracolumbar spine.  IMPRESSION: Wall thickening of sigmoid colon and small bowel loop and pelvis, question prior pelvic irradiation.  Chronic increased capsular and subcapsular enhancement in the liver, may be related to transient hepatic attenuation difference or to hepatic vascular anomalies.  Small scattered hepatic cysts.  Minimal sigmoid diverticulosis.  Question pelvic floor laxity.  No additional acute intra-abdominal or intrapelvic abnormalities identified.   Electronically Signed   By: Ulyses Southward M.D.   On: 02/07/2013 20:40    EKG Interpretation   None       MDM   1. BRBPR (bright red blood per  rectum)    Patient has a headache that started gradually the disc and some with other headaches but maybe a little bit worse. Do this and her being on Coumadin CT was obtained and normal. The patient had some abdominal tenderness mostly in the epigastrium on my exam but this also resolved after Protonix. Patient is in no nausea or vomiting. She's not had any bleeding from her rectum and over 12 hours. Rectal exam shows normal appearing stool but does become heme positive. Her hemoglobin is at 12, which appears to be around her baseline per old records. I discussed with [facility] gastroenterology, Dr. Matthias Hughs, who feels the patient can be worked up as an outpatient she does not have any gross stools and has a subtherapeutic INR. I discussed with the patient who feels comfortable going home and followup with her PCP and will return if any warning symptoms returned.    Audree Camel, MD 02/07/13 [id number]

## 2013-10-01 ENCOUNTER — Encounter: Payer: Self-pay | Admitting: *Deleted

## 2013-10-29 ENCOUNTER — Emergency Department (HOSPITAL_COMMUNITY)
Admission: EM | Admit: 2013-10-29 | Discharge: 2013-10-29 | Disposition: A | Discharge status: Home / Self Care | Payer: Medicare HMO | Attending: Emergency Medicine | Admitting: Emergency Medicine

## 2013-10-29 ENCOUNTER — Encounter (HOSPITAL_COMMUNITY): Payer: Self-pay | Admitting: Emergency Medicine

## 2013-10-29 ENCOUNTER — Emergency Department (HOSPITAL_COMMUNITY): Payer: Medicare HMO

## 2013-10-29 DIAGNOSIS — Z8541 Personal history of malignant neoplasm of cervix uteri: Secondary | ICD-10-CM | POA: Diagnosis not present

## 2013-10-29 DIAGNOSIS — Z862 Personal history of diseases of the blood and blood-forming organs and certain disorders involving the immune mechanism: Secondary | ICD-10-CM | POA: Diagnosis not present

## 2013-10-29 DIAGNOSIS — R319 Hematuria, unspecified: Secondary | ICD-10-CM | POA: Diagnosis not present

## 2013-10-29 DIAGNOSIS — Z8719 Personal history of other diseases of the digestive system: Secondary | ICD-10-CM | POA: Diagnosis not present

## 2013-10-29 DIAGNOSIS — R51 Headache: Secondary | ICD-10-CM | POA: Insufficient documentation

## 2013-10-29 DIAGNOSIS — Z8639 Personal history of other endocrine, nutritional and metabolic disease: Secondary | ICD-10-CM | POA: Diagnosis not present

## 2013-10-29 DIAGNOSIS — H109 Unspecified conjunctivitis: Secondary | ICD-10-CM

## 2013-10-29 DIAGNOSIS — Z79899 Other long term (current) drug therapy: Secondary | ICD-10-CM | POA: Diagnosis not present

## 2013-10-29 DIAGNOSIS — R519 Headache, unspecified: Secondary | ICD-10-CM

## 2013-10-29 DIAGNOSIS — I10 Essential (primary) hypertension: Secondary | ICD-10-CM | POA: Insufficient documentation

## 2013-10-29 DIAGNOSIS — I4891 Unspecified atrial fibrillation: Secondary | ICD-10-CM | POA: Diagnosis not present

## 2013-10-29 DIAGNOSIS — Z7901 Long term (current) use of anticoagulants: Secondary | ICD-10-CM | POA: Insufficient documentation

## 2013-10-29 DIAGNOSIS — Z86711 Personal history of pulmonary embolism: Secondary | ICD-10-CM | POA: Diagnosis not present

## 2013-10-29 DIAGNOSIS — Z87891 Personal history of nicotine dependence: Secondary | ICD-10-CM | POA: Insufficient documentation

## 2013-10-29 LAB — URINALYSIS, ROUTINE W REFLEX MICROSCOPIC
BILIRUBIN URINE: NEGATIVE
GLUCOSE, UA: NEGATIVE mg/dL
Hgb urine dipstick: NEGATIVE
KETONES UR: NEGATIVE mg/dL
Leukocytes, UA: NEGATIVE
Nitrite: NEGATIVE
PH: 7 (ref 5.0–8.0)
PROTEIN: NEGATIVE mg/dL
Specific Gravity, Urine: 1.011 (ref 1.005–1.030)
Urobilinogen, UA: 0.2 mg/dL (ref 0.0–1.0)

## 2013-10-29 LAB — CBC WITH DIFFERENTIAL/PLATELET
Basophils Absolute: 0 10*3/uL (ref 0.0–0.1)
Basophils Relative: 1 % (ref 0–1)
Eosinophils Absolute: 0.3 10*3/uL (ref 0.0–0.7)
Eosinophils Relative: 4 % (ref 0–5)
HCT: 41.2 % (ref 36.0–46.0)
HEMOGLOBIN: 13.6 g/dL (ref 12.0–15.0)
LYMPHS PCT: 47 % — AB (ref 12–46)
Lymphs Abs: 3.1 10*3/uL (ref 0.7–4.0)
MCH: 27.8 pg (ref 26.0–34.0)
MCHC: 33 g/dL (ref 30.0–36.0)
MCV: 84.3 fL (ref 78.0–100.0)
MONO ABS: 0.4 10*3/uL (ref 0.1–1.0)
Monocytes Relative: 6 % (ref 3–12)
NEUTROS ABS: 2.8 10*3/uL (ref 1.7–7.7)
Neutrophils Relative %: 42 % — ABNORMAL LOW (ref 43–77)
Platelets: 167 10*3/uL (ref 150–400)
RBC: 4.89 MIL/uL (ref 3.87–5.11)
RDW: 15.5 % (ref 11.5–15.5)
WBC: 6.6 10*3/uL (ref 4.0–10.5)

## 2013-10-29 LAB — PROTIME-INR
INR: 3.07 — ABNORMAL HIGH (ref 0.00–1.49)
Prothrombin Time: 31.7 seconds — ABNORMAL HIGH (ref 11.6–15.2)

## 2013-10-29 LAB — BASIC METABOLIC PANEL
Anion gap: 14 (ref 5–15)
BUN: 11 mg/dL (ref 6–23)
CALCIUM: 10.1 mg/dL (ref 8.4–10.5)
CO2: 23 meq/L (ref 19–32)
CREATININE: 0.87 mg/dL (ref 0.50–1.10)
Chloride: 107 mEq/L (ref 96–112)
GFR calc Af Amer: 70 mL/min — ABNORMAL LOW (ref >90)
GFR, EST NON AFRICAN AMERICAN: 60 mL/min — AB (ref >90)
GLUCOSE: 81 mg/dL (ref 70–99)
Potassium: 4.3 mEq/L (ref 3.7–5.3)
Sodium: 144 mEq/L (ref 137–147)

## 2013-10-29 LAB — POC OCCULT BLOOD, ED: Fecal Occult Bld: POSITIVE — AB

## 2013-10-29 MED ORDER — METOCLOPRAMIDE HCL 5 MG/ML IJ SOLN
10.0000 mg | Freq: Once | INTRAMUSCULAR | Status: AC
  Administered 2013-10-29: 10 mg via INTRAVENOUS
  Filled 2013-10-29: qty 2

## 2013-10-29 MED ORDER — MORPHINE SULFATE 2 MG/ML IJ SOLN
2.0000 mg | Freq: Once | INTRAMUSCULAR | Status: AC
  Administered 2013-10-29: 2 mg via INTRAVENOUS
  Filled 2013-10-29: qty 1

## 2013-10-29 MED ORDER — BUTALBITAL-APAP-CAFFEINE 50-325-40 MG PO TABS: 1.0000 | ORAL_TABLET | Freq: Four times a day (QID) | ORAL | Status: AC | PRN

## 2013-10-29 MED ORDER — FLUORESCEIN SODIUM 1 MG OP STRP
1.0000 | ORAL_STRIP | Freq: Once | OPHTHALMIC | Status: AC
  Administered 2013-10-29: 1 via OPHTHALMIC
  Filled 2013-10-29: qty 1

## 2013-10-29 MED ORDER — TOBRAMYCIN 0.3 % OP SOLN: 1.0000 [drp] | OPHTHALMIC | Status: DC

## 2013-10-29 MED ORDER — PROPARACAINE HCL 0.5 % OP SOLN
1.0000 [drp] | Freq: Once | OPHTHALMIC | Status: AC
  Administered 2013-10-29: 1 [drp] via OPHTHALMIC
  Filled 2013-10-29: qty 15

## 2013-10-29 NOTE — Discharge Instructions (Signed)
Take tobramycin drops for red eyes Take Fiorocet for headache Return to ED for further evaluation if her headache worsens, increased fevers, difficulty breathing, chest pain, or your vision changes

## 2013-10-29 NOTE — ED Notes (Signed)
Per pt, states increased urination at night, headache-states buring with urination and noticed small amount of blood when she wiped

## 2013-10-29 NOTE — ED Provider Notes (Signed)
CSN: [id number]     Arrival date & time 10/29/13  4496 History   First MD Initiated Contact with Patient 10/29/13 1034     Chief Complaint  Patient presents with  . Hematuria  . Headache     (Consider location/radiation/quality/duration/timing/severity/associated sxs/prior Treatment) HPI Sara Butler is a 80 y.o. female with a history of hypertension, stroke, PE, A. fib who presents today for evaluation of headache. The patient reports headache yesterday this started at 11:00 AM was throbbing, gradual in onset and went away yesterday afternoon after taking half an extra strength Tylenol. She reports the headache recurred this morning, she tried some more Tylenol this time with no relief. She localizes the pain to the back of her head and neck and also to her forehead and behind her eyes. She also reports high redness to her left eye--without pain or itching. She also reports dark stools for the past 3 or 4 days. No BRBPR. She reports more frequent urination over the past 4 days and once 2 days ago she thought she saw some red dots on her toilet paper after urinating but has not seen anything since. She is on Coumadin therapy and she reports her last reading was "low". She denies fevers, difficulty breathing, chest pain, abdominal pain, numbness or weakness.  Past Medical History  Diagnosis Date  . Hypertension   . Cervical cancer   . Atrial fibrillation   . Bleeding from anus   . Diverticulitis of colon   . Hyperlipidemia   . GERD (gastroesophageal reflux disease)   . Pulmonary embolism    Past Surgical History  Procedure Laterality Date  . Abdominal hysterectomy    . Back surgery     Family History  Problem Relation Age of Onset  . Diabetes Brother   . Heart failure Mother    History  Substance Use Topics  . Smoking status: Former Smoker -- 0.10 packs/day for 58 years    Types: Cigarettes  . Smokeless tobacco: Never Used  . Alcohol Use: No   OB History   Grav Para Term  Preterm Abortions TAB SAB Ect Mult Living                 Review of Systems  Constitutional: Negative for fever.  HENT: Negative for congestion.   Eyes: Positive for redness.  Respiratory: Negative for shortness of breath.   Cardiovascular: Negative for chest pain.  Gastrointestinal: Negative for abdominal pain.       Dark stool  Genitourinary: Positive for frequency.  Musculoskeletal: Negative for neck stiffness.  Neurological: Positive for headaches. Negative for weakness and light-headedness.  Psychiatric/Behavioral: Negative for confusion.      Allergies  Codeine  Home Medications   Prior to Admission medications   Medication Sig Start Date End Date Taking? Authorizing Provider  atenolol (TENORMIN) 50 MG tablet Take 50 mg by mouth daily.    Yes Historical Provider, MD  cholecalciferol (VITAMIN D) 1000 UNITS tablet Take 1,000 Units by mouth daily.   Yes Historical Provider, MD  losartan (COZAAR) 50 MG tablet Take 50 mg by mouth daily.   Yes Historical Provider, MD  vitamin B-12 (CYANOCOBALAMIN) 500 MCG tablet Take 500 mcg by mouth daily.   Yes Historical Provider, MD  warfarin (COUMADIN) 5 MG tablet Take 5-7.5 mg by mouth daily. On Saturday Sunday Tuesday and thursday take 1 1/2 tablet (7.5mg ) and on all other days take 1 tablet (5mg )   Yes Historical Provider, MD  butalbital-acetaminophen-caffeine (FIORICET) [phone]  MG per tablet Take 1-2 tablets by mouth every 6 (six) hours as needed for headache. 10/29/13 10/29/14  Viona Gilmore Yoltzin Ransom, PA-C  nitroGLYCERIN (NITROSTAT) 0.4 MG SL tablet Place 0.4 mg under the tongue every 5 (five) minutes as needed for chest pain.    Historical Provider, MD  tobramycin (TOBREX) 0.3 % ophthalmic solution Place 1 drop into the left eye every 4 (four) hours. Place one drop in the affected eye every 4 hours for 7 days 10/29/13   Viona Gilmore Demontrae Gilbert, PA-C   BP 193/90  Pulse 62  Temp(Src) 98.7 F (37.1 C) (Oral)  Resp 17  SpO2 99% Physical Exam   Nursing note and vitals reviewed. Constitutional: She is oriented to person, place, and time. She appears well-developed and well-nourished.  HENT:  Head: Normocephalic and atraumatic.  Mouth/Throat: Oropharynx is clear and moist. No oropharyngeal exudate.  No appreciable lesions or contusions noted. No Meningeal Signs  Eyes: Conjunctivae and EOM are normal. Pupils are equal, round, and reactive to light. Right eye exhibits no discharge. Left eye exhibits no discharge. No scleral icterus.  Mild conjunctivitis to left lateral eye  Neck: Neck supple. No JVD present.  Cardiovascular: Normal rate, regular rhythm and normal heart sounds.   Pulmonary/Chest: Effort normal and breath sounds normal. No respiratory distress. She has no wheezes. She has no rales.  Abdominal: Soft. There is no tenderness.  Musculoskeletal: She exhibits no edema and no tenderness.  Neurological: She is alert and oriented to person, place, and time.  Cranial Nerves II-XII grossly intact  Skin: Skin is warm and dry. No rash noted.  Psychiatric: She has a normal mood and affect.    ED Course  Procedures (including critical care time) Labs Review Labs Reviewed  BASIC METABOLIC PANEL - Abnormal; Notable for the following:    GFR calc non Af Amer 60 (*)    GFR calc Af Amer 70 (*)    All other components within normal limits  PROTIME-INR - Abnormal; Notable for the following:    Prothrombin Time 31.7 (*)    INR 3.07 (*)    All other components within normal limits  CBC WITH DIFFERENTIAL - Abnormal; Notable for the following:    Neutrophils Relative % 42 (*)    Lymphocytes Relative 47 (*)    All other components within normal limits  POC OCCULT BLOOD, ED - Abnormal; Notable for the following:    Fecal Occult Bld POSITIVE (*)    All other components within normal limits  URINALYSIS, ROUTINE W REFLEX MICROSCOPIC  OCCULT BLOOD X 1 CARD TO LAB, STOOL   Stools were not overtly dark or bloody.    Imaging  Review Ct Head Wo Contrast  10/29/2013   CLINICAL DATA:  Increased urination at night.  Headache.  EXAM: CT HEAD WITHOUT CONTRAST  TECHNIQUE: Contiguous axial images were obtained from the base of the skull through the vertex without intravenous contrast.  COMPARISON:  02/07/2013  FINDINGS: Mild global atrophy appropriate to age. Chronic ischemic changes in the periventricular white matter. Encephalomalacia in the left occipital lobe.  No mass effect, midline shift, or hemorrhage.  Mastoid air cells and visualized paranasal sinuses are clear.  IMPRESSION: No acute intracranial pathology.   Electronically Signed   By: Maryclare Bean M.D.   On: 10/29/2013 12:11     EKG Interpretation None     Meds given in ED:  Medications  proparacaine (ALCAINE) 0.5 % ophthalmic solution 1 drop (1 drop Left Eye Given by Other  10/29/13 1401)  morphine 2 MG/ML injection 2 mg (2 mg Intravenous Given 10/29/13 1227)  metoCLOPramide (REGLAN) injection 10 mg (10 mg Intravenous Given 10/29/13 1227)  fluorescein ophthalmic strip 1 strip (1 strip Left Eye Given by Other 10/29/13 1402)    Discharge Medication List as of 10/29/2013  2:26 PM    START taking these medications   Details  butalbital-acetaminophen-caffeine (FIORICET) 50-325-40 MG per tablet Take 1-2 tablets by mouth every 6 (six) hours as needed for headache., Starting 10/29/2013, Until Sun 10/29/14, Print    tobramycin (TOBREX) 0.3 % ophthalmic solution Place 1 drop into the left eye every 4 (four) hours. Place one drop in the affected eye every 4 hours for 7 days, Starting 10/29/2013, Until Discontinued, Print       Filed Vitals:   10/29/13 1230 10/29/13 1300 10/29/13 1347 10/29/13 1416  BP:   184/82 193/90  Pulse: 64 62 100 62  Temp:    98.7 F (37.1 C)  TempSrc:    Oral  Resp:   18 17  SpO2: 100% 98% 100% 99%    MDM  Vitals stable - WNL -afebrile Pt resting comfortably in ED, Reglan and Morphine helped alleviate HA. HA in addition to age, Coumadin therapy that  is subtherapeutic, CT obtained. CT shows no acute intracranial abnormality . Ophthalmic exam shows no evidence of corneal abrasion, tonometry shows IOP 13 L and 14 R. No concern for acute angle closure glaucoma Advised f/u with PCP/GI for hemoccult (+) stool. No anemia, labwork essentially normal. Will DC with tobramycin ophthalmic drops for conjunctivitis and Fioricet for headache Discussed f/u with PCP and return precautions, pt very amenable to plan.   Final diagnoses:  Nonintractable headache, unspecified chronicity pattern, unspecified headache type  Conjunctivitis of left eye  Prior to patient discharge, I discussed and reviewed this case with Dr.Campos         Verl Dicker, PA-C 10/29/13 2133

## 2013-10-30 NOTE — ED Provider Notes (Signed)
Medical screening examination/treatment/procedure(s) were conducted as a shared visit with non-physician practitioner(s) and myself.  I personally evaluated the patient during the encounter.   EKG Interpretation None      Extraocular movements are normal.  Pupils are normal.  Intraocular pressure bilateral is normal.  This appears to be a mild conjunctivitis of left eye.  Doubt temporal arteritis.  Headache resolved with standard medications in the ER.  Head CT without abnormality.  Discharge home with a care followup.  She understands to return to the ER for new or worsening symptoms.  No change in her vision and no photophobia at the left eye.  Pupils equal round reactive to light and accommodation  Ct Head Wo Contrast  10/29/2013   CLINICAL DATA:  Increased urination at night.  Headache.  EXAM: CT HEAD WITHOUT CONTRAST  TECHNIQUE: Contiguous axial images were obtained from the base of the skull through the vertex without intravenous contrast.  COMPARISON:  02/07/2013  FINDINGS: Mild global atrophy appropriate to age. Chronic ischemic changes in the periventricular white matter. Encephalomalacia in the left occipital lobe.  No mass effect, midline shift, or hemorrhage.  Mastoid air cells and visualized paranasal sinuses are clear.  IMPRESSION: No acute intracranial pathology.   Electronically Signed   By: Maryclare Bean M.D.   On: 10/29/2013 12:11  I personally reviewed the imaging tests through PACS system I reviewed available ER/hospitalization records through the [place], MD 10/30/13 [phone]

## 2014-03-02 DIAGNOSIS — Z7901 Long term (current) use of anticoagulants: Secondary | ICD-10-CM | POA: Diagnosis not present

## 2014-03-02 DIAGNOSIS — Z86711 Personal history of pulmonary embolism: Secondary | ICD-10-CM | POA: Diagnosis not present

## 2014-03-09 DIAGNOSIS — I482 Chronic atrial fibrillation: Secondary | ICD-10-CM | POA: Diagnosis not present

## 2014-03-09 DIAGNOSIS — R252 Cramp and spasm: Secondary | ICD-10-CM | POA: Diagnosis not present

## 2014-03-09 DIAGNOSIS — N3941 Urge incontinence: Secondary | ICD-10-CM | POA: Diagnosis not present

## 2014-03-09 DIAGNOSIS — E559 Vitamin D deficiency, unspecified: Secondary | ICD-10-CM | POA: Diagnosis not present

## 2014-03-09 DIAGNOSIS — I10 Essential (primary) hypertension: Secondary | ICD-10-CM | POA: Diagnosis not present

## 2014-03-09 DIAGNOSIS — Z7901 Long term (current) use of anticoagulants: Secondary | ICD-10-CM | POA: Diagnosis not present

## 2014-03-09 DIAGNOSIS — R5383 Other fatigue: Secondary | ICD-10-CM | POA: Diagnosis not present

## 2014-04-03 DIAGNOSIS — Z7901 Long term (current) use of anticoagulants: Secondary | ICD-10-CM | POA: Diagnosis not present

## 2014-04-03 DIAGNOSIS — I4891 Unspecified atrial fibrillation: Secondary | ICD-10-CM | POA: Diagnosis not present

## 2014-04-17 DIAGNOSIS — Z86711 Personal history of pulmonary embolism: Secondary | ICD-10-CM | POA: Diagnosis not present

## 2014-04-17 DIAGNOSIS — Z7901 Long term (current) use of anticoagulants: Secondary | ICD-10-CM | POA: Diagnosis not present

## 2014-05-01 DIAGNOSIS — Z7901 Long term (current) use of anticoagulants: Secondary | ICD-10-CM | POA: Diagnosis not present

## 2014-05-01 DIAGNOSIS — Z86711 Personal history of pulmonary embolism: Secondary | ICD-10-CM | POA: Diagnosis not present

## 2014-05-22 DIAGNOSIS — Z86711 Personal history of pulmonary embolism: Secondary | ICD-10-CM | POA: Diagnosis not present

## 2014-05-22 DIAGNOSIS — Z7901 Long term (current) use of anticoagulants: Secondary | ICD-10-CM | POA: Diagnosis not present

## 2014-06-01 DIAGNOSIS — Z86711 Personal history of pulmonary embolism: Secondary | ICD-10-CM | POA: Diagnosis not present

## 2014-06-01 DIAGNOSIS — Z7901 Long term (current) use of anticoagulants: Secondary | ICD-10-CM | POA: Diagnosis not present

## 2014-06-22 DIAGNOSIS — Z7901 Long term (current) use of anticoagulants: Secondary | ICD-10-CM | POA: Diagnosis not present

## 2014-06-22 DIAGNOSIS — Z86711 Personal history of pulmonary embolism: Secondary | ICD-10-CM | POA: Diagnosis not present

## 2014-07-04 ENCOUNTER — Emergency Department (HOSPITAL_COMMUNITY): Payer: Commercial Managed Care - HMO

## 2014-07-04 ENCOUNTER — Observation Stay (HOSPITAL_COMMUNITY)
Admission: EM | Admit: 2014-07-04 | Discharge: 2014-07-05 | Disposition: A | Discharge status: Home / Self Care | Payer: Commercial Managed Care - HMO | Attending: Internal Medicine | Admitting: Internal Medicine

## 2014-07-04 ENCOUNTER — Encounter (HOSPITAL_COMMUNITY): Payer: Self-pay

## 2014-07-04 DIAGNOSIS — R079 Chest pain, unspecified: Secondary | ICD-10-CM | POA: Diagnosis present

## 2014-07-04 DIAGNOSIS — Z87891 Personal history of nicotine dependence: Secondary | ICD-10-CM | POA: Diagnosis not present

## 2014-07-04 DIAGNOSIS — M19041 Primary osteoarthritis, right hand: Secondary | ICD-10-CM | POA: Diagnosis not present

## 2014-07-04 DIAGNOSIS — Z90710 Acquired absence of both cervix and uterus: Secondary | ICD-10-CM | POA: Diagnosis not present

## 2014-07-04 DIAGNOSIS — M19031 Primary osteoarthritis, right wrist: Secondary | ICD-10-CM | POA: Insufficient documentation

## 2014-07-04 DIAGNOSIS — E785 Hyperlipidemia, unspecified: Secondary | ICD-10-CM | POA: Insufficient documentation

## 2014-07-04 DIAGNOSIS — K219 Gastro-esophageal reflux disease without esophagitis: Secondary | ICD-10-CM | POA: Diagnosis not present

## 2014-07-04 DIAGNOSIS — I480 Paroxysmal atrial fibrillation: Secondary | ICD-10-CM | POA: Insufficient documentation

## 2014-07-04 DIAGNOSIS — M11231 Other chondrocalcinosis, right wrist: Secondary | ICD-10-CM | POA: Diagnosis not present

## 2014-07-04 DIAGNOSIS — R918 Other nonspecific abnormal finding of lung field: Secondary | ICD-10-CM | POA: Diagnosis not present

## 2014-07-04 DIAGNOSIS — R51 Headache: Secondary | ICD-10-CM | POA: Insufficient documentation

## 2014-07-04 DIAGNOSIS — R071 Chest pain on breathing: Secondary | ICD-10-CM | POA: Diagnosis not present

## 2014-07-04 DIAGNOSIS — R072 Precordial pain: Secondary | ICD-10-CM | POA: Diagnosis not present

## 2014-07-04 DIAGNOSIS — I129 Hypertensive chronic kidney disease with stage 1 through stage 4 chronic kidney disease, or unspecified chronic kidney disease: Secondary | ICD-10-CM | POA: Diagnosis not present

## 2014-07-04 DIAGNOSIS — Z66 Do not resuscitate: Secondary | ICD-10-CM | POA: Insufficient documentation

## 2014-07-04 DIAGNOSIS — M11831 Other specified crystal arthropathies, right wrist: Secondary | ICD-10-CM

## 2014-07-04 DIAGNOSIS — R05 Cough: Secondary | ICD-10-CM | POA: Diagnosis not present

## 2014-07-04 DIAGNOSIS — M109 Gout, unspecified: Secondary | ICD-10-CM | POA: Diagnosis not present

## 2014-07-04 DIAGNOSIS — R0789 Other chest pain: Principal | ICD-10-CM | POA: Insufficient documentation

## 2014-07-04 DIAGNOSIS — Z86711 Personal history of pulmonary embolism: Secondary | ICD-10-CM | POA: Insufficient documentation

## 2014-07-04 DIAGNOSIS — M11241 Other chondrocalcinosis, right hand: Secondary | ICD-10-CM | POA: Diagnosis not present

## 2014-07-04 DIAGNOSIS — M10031 Idiopathic gout, right wrist: Secondary | ICD-10-CM | POA: Diagnosis not present

## 2014-07-04 DIAGNOSIS — N183 Chronic kidney disease, stage 3 (moderate): Secondary | ICD-10-CM | POA: Diagnosis not present

## 2014-07-04 DIAGNOSIS — Z7901 Long term (current) use of anticoagulants: Secondary | ICD-10-CM | POA: Insufficient documentation

## 2014-07-04 DIAGNOSIS — Z8541 Personal history of malignant neoplasm of cervix uteri: Secondary | ICD-10-CM | POA: Diagnosis not present

## 2014-07-04 DIAGNOSIS — I4891 Unspecified atrial fibrillation: Secondary | ICD-10-CM | POA: Diagnosis present

## 2014-07-04 DIAGNOSIS — I10 Essential (primary) hypertension: Secondary | ICD-10-CM | POA: Diagnosis not present

## 2014-07-04 HISTORY — DX: Gout, unspecified: M10.9

## 2014-07-04 LAB — CBC
HCT: 40.5 % (ref 36.0–46.0)
HEMOGLOBIN: 13 g/dL (ref 12.0–15.0)
MCH: 27.8 pg (ref 26.0–34.0)
MCHC: 32.1 g/dL (ref 30.0–36.0)
MCV: 86.5 fL (ref 78.0–100.0)
PLATELETS: 172 10*3/uL (ref 150–400)
RBC: 4.68 MIL/uL (ref 3.87–5.11)
RDW: 15 % (ref 11.5–15.5)
WBC: 8.7 10*3/uL (ref 4.0–10.5)

## 2014-07-04 LAB — BASIC METABOLIC PANEL
ANION GAP: 12 (ref 5–15)
BUN: 14 mg/dL (ref 6–20)
CALCIUM: 9.9 mg/dL (ref 8.9–10.3)
CO2: 21 mmol/L — ABNORMAL LOW (ref 22–32)
Chloride: 104 mmol/L (ref 101–111)
Creatinine, Ser: 0.98 mg/dL (ref 0.44–1.00)
GFR calc Af Amer: >60 mL/min (ref >60)
GFR calc non Af Amer: 52 mL/min — ABNORMAL LOW (ref >60)
Glucose, Bld: 95 mg/dL (ref 70–99)
Potassium: 3.4 mmol/L — ABNORMAL LOW (ref 3.5–5.1)
Sodium: 137 mmol/L (ref 135–145)

## 2014-07-04 LAB — PROTIME-INR
INR: 1.51 — AB (ref 0.00–1.49)
Prothrombin Time: 18.4 seconds — ABNORMAL HIGH (ref 11.6–15.2)

## 2014-07-04 LAB — URIC ACID: URIC ACID, SERUM: 6.8 mg/dL — AB (ref 2.3–6.6)

## 2014-07-04 LAB — I-STAT TROPONIN, ED: TROPONIN I, POC: 0 ng/mL (ref 0.00–0.08)

## 2014-07-04 LAB — TROPONIN I: Troponin I: <0.03 ng/mL (ref <0.031)

## 2014-07-04 MED ORDER — IOHEXOL 350 MG/ML SOLN
100.0000 mL | Freq: Once | INTRAVENOUS | Status: AC | PRN
  Administered 2014-07-04: 100 mL via INTRAVENOUS

## 2014-07-04 MED ORDER — WARFARIN - PHARMACIST DOSING INPATIENT: Freq: Every day | Status: DC

## 2014-07-04 MED ORDER — ONDANSETRON HCL 4 MG/2ML IJ SOLN: 4.0000 mg | Freq: Four times a day (QID) | INTRAMUSCULAR | Status: DC | PRN

## 2014-07-04 MED ORDER — POTASSIUM CHLORIDE CRYS ER 20 MEQ PO TBCR
40.0000 meq | EXTENDED_RELEASE_TABLET | Freq: Once | ORAL | Status: AC
  Administered 2014-07-04: 40 meq via ORAL
  Filled 2014-07-04: qty 2

## 2014-07-04 MED ORDER — GI COCKTAIL ~~LOC~~
30.0000 mL | Freq: Once | ORAL | Status: AC
  Administered 2014-07-04: 30 mL via ORAL
  Filled 2014-07-04: qty 30

## 2014-07-04 MED ORDER — LOSARTAN POTASSIUM 50 MG PO TABS
50.0000 mg | ORAL_TABLET | Freq: Every day | ORAL | Status: DC
  Administered 2014-07-04 – 2014-07-05 (×2): 50 mg via ORAL
  Filled 2014-07-04 (×2): qty 1

## 2014-07-04 MED ORDER — WARFARIN SODIUM 5 MG PO TABS
7.5000 mg | ORAL_TABLET | Freq: Once | ORAL | Status: AC
  Administered 2014-07-04: 7.5 mg via ORAL
  Filled 2014-07-04 (×2): qty 1

## 2014-07-04 MED ORDER — BUTALBITAL-APAP-CAFFEINE 50-325-40 MG PO TABS
1.0000 | ORAL_TABLET | Freq: Four times a day (QID) | ORAL | Status: DC | PRN
  Administered 2014-07-04: 1 via ORAL
  Filled 2014-07-04: qty 1

## 2014-07-04 MED ORDER — ATENOLOL 50 MG PO TABS
50.0000 mg | ORAL_TABLET | Freq: Every day | ORAL | Status: DC
  Administered 2014-07-04 – 2014-07-05 (×2): 50 mg via ORAL
  Filled 2014-07-04 (×2): qty 1

## 2014-07-04 MED ORDER — ASPIRIN EC 81 MG PO TBEC
81.0000 mg | DELAYED_RELEASE_TABLET | Freq: Every day | ORAL | Status: DC
  Administered 2014-07-05: 81 mg via ORAL
  Filled 2014-07-04: qty 1

## 2014-07-04 MED ORDER — PREDNISONE 50 MG PO TABS
50.0000 mg | ORAL_TABLET | Freq: Every day | ORAL | Status: DC
  Administered 2014-07-04 – 2014-07-05 (×2): 50 mg via ORAL
  Filled 2014-07-04 (×2): qty 1

## 2014-07-04 MED ORDER — ATORVASTATIN CALCIUM 40 MG PO TABS
80.0000 mg | ORAL_TABLET | Freq: Every day | ORAL | Status: DC
Start: 2014-07-04 — End: 2014-07-05
  Administered 2014-07-04: 80 mg via ORAL
  Filled 2014-07-04: qty 2

## 2014-07-04 MED ORDER — ACETAMINOPHEN 325 MG PO TABS: 650.0000 mg | ORAL_TABLET | ORAL | Status: DC | PRN

## 2014-07-04 MED ORDER — COLCHICINE 0.6 MG PO TABS
0.6000 mg | ORAL_TABLET | Freq: Every day | ORAL | Status: DC
  Administered 2014-07-04 – 2014-07-05 (×2): 0.6 mg via ORAL
  Filled 2014-07-04 (×3): qty 1

## 2014-07-04 MED ORDER — ASPIRIN 81 MG PO CHEW
324.0000 mg | CHEWABLE_TABLET | Freq: Once | ORAL | Status: DC
  Filled 2014-07-04: qty 4

## 2014-07-04 MED ORDER — NITROGLYCERIN 0.4 MG SL SUBL: 0.4000 mg | SUBLINGUAL_TABLET | SUBLINGUAL | Status: DC | PRN

## 2014-07-04 NOTE — ED Provider Notes (Signed)
TIME SEEN: 10:00 AM  CHIEF COMPLAINT: Chest pain, right wrist pain  HPI: Pt is a 81 y.o. female with history of hypertension, hyperlipidemia, atrial fibrillation, pulmonary embolus on Coumadin who presents to the emergency department with complaints of right wrist pain for the past 2-3 days. Has had similar symptoms with gout in the left wrist before. States she called her primary care physician was started on colchicine with no improvement. Denies any injury to this wrist or hand. No numbness, tingling or weakness. No swelling of the arm but the wrist is swollen and red and warm. No fever.   Patient also states that she had central chest tightness without radiation when she woke up at 6:30 this morning. It is worse with deep breath and exertion. No associated shortness of breath, nausea or vomiting, diaphoresis or dizziness but states she does feel very tired today which is abnormal for her. No cough. No lower extremity swelling or pain. She states the pain also feels like a "knot". She states when she had her pulmonary embolus she had the same pain in the right lateral chest. She reports she has not missed any doses of Coumadin. Denies a history of CHF, CAD.  PCP is Dr. Tamala Julian  ROS: See HPI Constitutional: no fever  Eyes: no drainage  ENT: no runny nose   Cardiovascular:   chest pain  Resp: no SOB  GI: no vomiting GU: no dysuria Integumentary: no rash  Allergy: no hives  Musculoskeletal: no leg swelling  Neurological: no slurred speech ROS otherwise negative  PAST MEDICAL HISTORY/PAST SURGICAL HISTORY:  Past Medical History  Diagnosis Date  . Hypertension   . Cervical cancer   . Atrial fibrillation   . Bleeding from anus   . Diverticulitis of colon   . Hyperlipidemia   . GERD (gastroesophageal reflux disease)   . Pulmonary embolism     MEDICATIONS:  Prior to Admission medications   Medication Sig Start Date End Date Taking? Authorizing Provider  atenolol (TENORMIN) 50 MG  tablet Take 50 mg by mouth daily.     Historical Provider, MD  butalbital-acetaminophen-caffeine (FIORICET) [phone] MG per tablet Take 1-2 tablets by mouth every 6 (six) hours as needed for headache. 10/29/13 10/29/14  Comer Locket, PA-C  cholecalciferol (VITAMIN D) 1000 UNITS tablet Take 1,000 Units by mouth daily.    Historical Provider, MD  losartan (COZAAR) 50 MG tablet Take 50 mg by mouth daily.    Historical Provider, MD  nitroGLYCERIN (NITROSTAT) 0.4 MG SL tablet Place 0.4 mg under the tongue every 5 (five) minutes as needed for chest pain.    Historical Provider, MD  tobramycin (TOBREX) 0.3 % ophthalmic solution Place 1 drop into the left eye every 4 (four) hours. Place one drop in the affected eye every 4 hours for 7 days 10/29/13   Comer Locket, PA-C  vitamin B-12 (CYANOCOBALAMIN) 500 MCG tablet Take 500 mcg by mouth daily.    Historical Provider, MD  warfarin (COUMADIN) 5 MG tablet Take 5-7.5 mg by mouth daily. On Saturday Sunday Tuesday and thursday take 1 1/2 tablet (7.5mg ) and on all other days take 1 tablet (5mg )    Historical Provider, MD    ALLERGIES:  Allergies  Allergen Reactions  . Codeine Other (See Comments)    dizziness    SOCIAL HISTORY:  History  Substance Use Topics  . Smoking status: Former Smoker -- 0.10 packs/day for 58 years    Types: Cigarettes  . Smokeless tobacco: Never Used  .  Alcohol Use: No    FAMILY HISTORY: Family History  Problem Relation Age of Onset  . Diabetes Brother   . Heart failure Mother     EXAM: BP 149/84 mmHg  Pulse 104  Temp(Src) 98.2 F (36.8 C) (Oral)  Resp 16  SpO2 98% CONSTITUTIONAL: Alert and oriented and responds appropriately to questions. Well-appearing; well-nourished HEAD: Normocephalic EYES: Conjunctivae clear, PERRL ENT: normal nose; no rhinorrhea; moist mucous membranes; pharynx without lesions noted NECK: Supple, no meningismus, no LAD  CARD: RRR; S1 and S2 appreciated; no murmurs, no clicks, no rubs, no  gallops RESP: Normal chest excursion without splinting or tachypnea; breath sounds clear and equal bilaterally; no wheezes, no rhonchi, no rales, no hypoxia or respiratory distress, speaking full sentences, anterior chest wall is nontender to palpation without crepitus or ecchymosis or deformity ABD/GI: Normal bowel sounds; non-distended; soft, non-tender, no rebound, no guarding, no peritoneal signs BACK:  The back appears normal and is non-tender to palpation, there is no CVA tenderness EXT: Patient has swelling of the right wrist with erythema and warmth but is nontender when she moves the wrist, compartment soft, no bony deformity, 2+ radial pulses bilaterally, sensation to light touch intact diffusely, Normal ROM in all joints; otherwise extremities are non-tender to palpation; no edema; normal capillary refill; no cyanosis, no calf tenderness or swelling    SKIN: Normal color for age and race; warm NEURO: Moves all extremities equally, sensation to light touch intact diffusely, cranial nerves II through XII intact PSYCH: The patient's mood and manner are appropriate. Grooming and personal hygiene are appropriate.  MEDICAL DECISION MAKING: Patient here with gout to the right wrist. Doubt this is septic arthritis. She is afebrile. Has had gout in the left wrist before that was similar to this. No history of injury. X-rays ordered in triage show CPPD, degenerative changes but no fracture or dislocation. She declines pain medication for this.  Patient also complaining of chest pain and denies weakness, fatigue. EKG shows that she is back in atrial fibrillation. She is rate controlled. Labs ordered in triage show negative troponin and are otherwise unremarkable. Chest x-ray clear. We'll obtain a CT of her chest to rule out pulmonary embolus given she has had one in the past, describes her pain as pleuritic today and her INR today is subtherapeutic. Anticipate that she will need admission for ACS rule out  if CT chest is unremarkable. She is in agreement with this plan.  ED PROGRESS: CT of patient's chest shows pulmonary nodules but no pulmonary embolus, infiltrate or edema. Still having a mild amount of chest pain. We'll give aspirin nitroglycerin. Discussed with Dr. Sheran Fava who agrees with admission to telemetry, observation for chest pain rule out. Patient updated with plan.    EKG Interpretation  Date/Time:  Tuesday Jul 04 2014 09:15:24 EDT Ventricular Rate:  90 PR Interval:    QRS Duration: 79 QT Interval:  338 QTC Calculation: 413 R Axis:   32 Text Interpretation:  Atrial fibrillation Borderline T wave abnormalities Confirmed by Charisma Charlot,  DO, Lakeithia Rasor [phone]) on 07/04/2014 9:17:57 AM          EKG Interpretation  Date/Time:  Tuesday Jul 04 2014 11:34:00 EDT Ventricular Rate:  77 PR Interval:    QRS Duration: 75 QT Interval:  362 QTC Calculation: 410 R Axis:   24 Text Interpretation:  Atrial fibrillation Borderline T abnormalities, diffuse leads Confirmed by Edwin Cherian,  DO, Moira Umholtz ([id number]) on 07/04/2014 11:38:34 AM  [place], DO 07/04/14 [phone]

## 2014-07-04 NOTE — H&P (Addendum)
[facility] History and Physical  Sara Butler OJJ:[id number] DOB: 11/15/1928 DOA: 07/04/2014  Referring physician:  Pryor Butler PCP:  Sara Naas, MD   Chief Complaint:  Right hand pain  HPI:  The patient is a 81 y.o. year-old female with history of hypertension, hyperlipidemia, atrial fibrillation, pulmonary embolism, gout, GERD who presents with right hand pain.  The patient was last at their baseline health until approximately 4 days prior to admission.  She lives alone and is independent for ADLs.  She states she developed fairly acute onset right hand and wrist swelling, warmth, redness, and pain. Her hand swelled up so much that she had difficulty moving her fingers and she was in severe 10 out of 10 pain. She talked to her primary care doctor's office on the phone and they gave her prescription for colchicine and she took 2 tabs yesterday followed by 1 tab a proximally 1 hour later as instructed.  After taking her pills, she developed watery diarrhea about 3 times, which is since resolved. She denies hospitalizations or antibiotic exposures in the last 3 months. She states that the redness, pain, and swelling have gotten much better but they have not gone completely away so she came to the emergency department today. She is not able to move her fingers.  In the ER, X-ray of the right wrist demonstrated advanced degenerative changes including chondrocalcinosis which is consistent with calcium pyrophosphate deposition disease, or pseudogout.  Incidentally, while in the emergency department, she reported that she has had some substernal chest pain that feels like "a knot" or pressure. The pain came on since this morning gradually and is worse with taking a deep breath, moderate in severity. She denies radiation to her shoulders, neck, jaw, or back. She has some mild increased shortness of breath with the pain but denies lightheadedness, nausea, and diaphoresis. The pain has been  constant since this morning and does not wax or wane. She stated it felt similar to when she had a pulmonary embolism. She also stated that she thought maybe it was GERD.  Troponin was negative, EKG demonstrated rate controlled atrial fibrillation without ST segment changes or T-wave inversions.  Because her INR was subtherapeutic at 1.5, she underwent CT angio chest which demonstrated no evidence of pulmonary embolism.  She is being admitted for right hand pain which is preventing her from completing her ADLs at home and she lives alone and for chest pain with a HEART score of 4.    Review of Systems:  General:  Denies fevers, chronic chills, weight loss or gain HEENT:  Denies changes to hearing and vision, rhinorrhea, sinus congestion, sore throat CV:  Denies palpitations, lower extremity edema.  PULM:  Denies SOB, wheezing, cough.   GI:  Denies nausea, vomiting, constipation, positive diarrhea.   GU:  Denies dysuria, frequency, urgency ENDO:  Denies polyuria, polydipsia.   HEME:  Denies hematemesis, blood in stools, melena, abnormal bruising or bleeding.  LYMPH:  Denies lymphadenopathy.   MSK:  Leg cramps, right wrist pain as per HPI.   DERM:  Denies skin rash or ulcer.   NEURO:  Denies focal numbness, weakness, slurred speech, confusion, facial droop.  PSYCH:  Denies anxiety and depression.    Past Medical History  Diagnosis Date  . Hypertension   . Cervical cancer   . Atrial fibrillation   . Bleeding from anus   . Diverticulitis of colon   . Hyperlipidemia   . GERD (gastroesophageal reflux disease)   .  Pulmonary embolism    Past Surgical History  Procedure Laterality Date  . Abdominal hysterectomy    . Back surgery     Social History:  reports that she has quit smoking. Her smoking use included Cigarettes. She has a 5.8 pack-year smoking history. She has never used smokeless tobacco. She reports that she does not drink alcohol or use illicit drugs. Lives alone and  independent for ADLs.  No Known Allergies  Family History  Problem Relation Age of Onset  . Diabetes Brother   . Heart failure Mother      Prior to Admission medications   Medication Sig Start Date End Date Taking? Authorizing Provider  atenolol (TENORMIN) 50 MG tablet Take 50 mg by mouth daily.    Yes Historical Provider, MD  butalbital-acetaminophen-caffeine (FIORICET) 50-325-40 MG per tablet Take 1-2 tablets by mouth every 6 (six) hours as needed for headache. 10/29/13 10/29/14 Yes Sara Locket, PA-C  colchicine 0.6 MG tablet Take 0.6 mg by mouth daily. Take 2 tablets now, then 1 tablet an hour later, then take as directed- pt states she received no other instructions on taking medication   Yes Historical Provider, MD  losartan (COZAAR) 50 MG tablet Take 50 mg by mouth daily.   Yes Historical Provider, MD  warfarin (COUMADIN) 5 MG tablet Take 5-7.5 mg by mouth daily. On Monday and Wednesday take 1 1/2 tablet (7.5mg ) and on all other days take 1 tablet (5mg )   Yes Historical Provider, MD  tobramycin (TOBREX) 0.3 % ophthalmic solution Place 1 drop into the left eye every 4 (four) hours. Place one drop in the affected eye every 4 hours for 7 days Patient not taking: Reported on 07/04/2014 10/29/13   Sara Locket, PA-C   Physical Exam: Filed Vitals:   07/04/14 0904 07/04/14 1126  BP: 149/84 135/82  Pulse: 104 82  Temp: 98.2 F (36.8 C) 97.9 F (36.6 C)  TempSrc: Oral Oral  Resp: 16 14  SpO2: 98% 98%     General:  Thin female, no acute distress, appears younger than stated age  Eyes:  PERRL, anicteric, non-injected.  ENT:  Nares clear.  OP clear, non-erythematous without plaques or exudates.  MMM.  Neck:  Supple without TM or JVD.    Lymph:  No cervical, supraclavicular, or submandibular LAD.  Cardiovascular:  RRR, normal S1, S2, without m/r/g.  2+ pulses, warm extremities  Respiratory:  CTA bilaterally without increased WOB.  Abdomen:  NABS.  Soft, ND/NT.    Skin:   No rashes or focal lesions.  Musculoskeletal:  Normal bulk and tone.  No LE edema.  Right wrist is swollen, mildly erythematous over the dorsum of the hand, wrist, very warm to touch, and very tender to palpation. She is able to wiggle her fingers slightly.  Psychiatric:  A & O x 4.  Appropriate affect.  Neurologic:  CN 3-12 intact.  5/5 strength.  Sensation intact.  Labs on Admission:  Basic Metabolic Panel:  Recent Labs Lab 07/04/14 0959  NA 137  K 3.4*  CL 104  CO2 21*  GLUCOSE 95  BUN 14  CREATININE 0.98  CALCIUM 9.9   Liver Function Tests: No results for input(s): AST, ALT, ALKPHOS, BILITOT, PROT, ALBUMIN in the last 168 hours. No results for input(s): LIPASE, AMYLASE in the last 168 hours. No results for input(s): AMMONIA in the last 168 hours. CBC:  Recent Labs Lab 07/04/14 0959  WBC 8.7  HGB 13.0  HCT 40.5  MCV 86.5  PLT 172   Cardiac Enzymes: No results for input(s): CKTOTAL, CKMB, CKMBINDEX, TROPONINI in the last 168 hours.  BNP (last 3 results) No results for input(s): BNP in the last 8760 hours.  ProBNP (last 3 results) No results for input(s): PROBNP in the last 8760 hours.  CBG: No results for input(s): GLUCAP in the last 168 hours.  Radiological Exams on Admission: Dg Chest 2 View  07/04/2014   CLINICAL DATA:  Coughing recently for few days, history hypertension, former smoker, atrial fibrillation, GERD  EXAM: CHEST  2 VIEW  COMPARISON:  11/11/2012  FINDINGS: Normal heart size, mediastinal contours and pulmonary vascularity.  Lungs clear.  No pleural effusion or pneumothorax.  Bones demineralized with dextro convex scoliosis.  IMPRESSION: No acute abnormalities.   Electronically Signed   By: Lavonia Dana M.D.   On: 07/04/2014 10:06   Dg Wrist Complete Right  07/04/2014   CLINICAL DATA:  81 year old female with right posterior and radial hand and wrist pain upon waking. Initial encounter.  EXAM: RIGHT WRIST - COMPLETE 3+ VIEW  COMPARISON:  None.   FINDINGS: Chondrocalcinosis at the right wrist. Distal radius and ulna appear intact. There is soft tissue swelling about the wrist. There is severe joint space loss, subchondral sclerosis, and bulky osteophytosis at the basal joint of the right thumb. Elsewhere carpal bone alignment and joint spaces within normal limits. The metacarpals appear intact. No acute osseous abnormality identified.  IMPRESSION: Advanced degenerative changes at the right wrist including Chondrocalcinosis which can be seen in the setting of calcium pyrophosphate deposition disease.  Soft tissue swelling but no acute osseous abnormality is identified.   Electronically Signed   By: Genevie Ann M.D.   On: 07/04/2014 10:05   Ct Angio Chest Pe W/cm &/or Wo Cm  07/04/2014   CLINICAL DATA:  RIGHT hand swelling, chest pain beginning this morning, headache, weakness, history pulmonary embolism, cervical cancer, atrial fibrillation, hypertension, hyperlipidemia  EXAM: CT ANGIOGRAPHY CHEST WITH CONTRAST  TECHNIQUE: Multidetector CT imaging of the chest was performed using the standard protocol during bolus administration of intravenous contrast. Multiplanar CT image reconstructions and MIPs were obtained to evaluate the vascular anatomy.  CONTRAST:  122mL OMNIPAQUE IOHEXOL 350 MG/ML SOLN  COMPARISON:  04/26/2011  FINDINGS: Scattered atherosclerotic calcifications aorta and coronary arteries.  No aortic aneurysm or dissection.  Minimally prominent subcarinal lymph node 12 mm Reymundo Winship axis image 49 unchanged.  Small cystic lesion with adjacent calcifications at RIGHT lobe liver 14 x 13 mm image 86 unchanged.  Remaining visualized portions of upper abdomen unremarkable.  Pulmonary arteries well opacified and patent.  No evidence of pulmonary embolism.  RIGHT middle lobe nodule 4 mm diameter image 63, previously 3 mm.  Calcified granuloma RIGHT lower lobe image 62 with additional tiny noncalcified RIGHT lower lobe nodule image 63.  Tiny LEFT lower lobe nodule  image 58 stable.  Suspect small posterior RIGHT diaphragmatic diaphragmatic defect containing fat.  No acute infiltrate, pleural effusion, or pneumothorax.  Osseous structures unremarkable.  Review of the MIP images confirms the above findings.  IMPRESSION: No evidence of pulmonary embolism.  Multiple tiny pulmonary nodules, majority stable, single nodule RIGHT middle lobe 4 mm diameter, previously 3 mm diameter, though such a slight difference in size between the 2 studies could be related to differences in slice averaging; followup CT chest recommended in 6 months to assess stability.   Electronically Signed   By: Lavonia Dana M.D.   On: 07/04/2014 12:25   Dg Hand  Complete Right  07/04/2014   CLINICAL DATA:  Awoke this morning with RIGHT posterior and radial hand pain and wrist pain, feels like she has a knot at the ulnar side of her wrist  EXAM: RIGHT HAND - COMPLETE 3+ VIEW  COMPARISON:  None  FINDINGS: Osseous demineralization diffusely.  Advanced degenerative changes first CMC joint.  Scattered mild degenerative changes of interphalangeal joints greatest at DIP joint index finger.  Mild degenerative changes at STT joint, second MCP joint, radiocarpal joint.  Chondrocalcinosis at carpus question CPPD.  No acute fracture, dislocation, or bone destruction.  Mild dorsal soft tissue swelling.  IMPRESSION: Scattered degenerative changes as above with question CPPD at carpus.  No acute abnormalities.   Electronically Signed   By: Lavonia Dana M.D.   On: 07/04/2014 10:05    EKG: Independently reviewed. Atrial fibrillation without ST segment changes or acute changes suggestive of ACS  Assessment/Plan Active Problems:   Chest pain  ---  Atypical chest pain, low suspicion for cardiac pain however HEART score is 4 (2 for age and 2 for risk factors).  Differential includes ACS, GERD, MSK pain.  -  Telemetry -  Cycle troponins and ECG -  A1c & lipid panel -  She refused aspirin -  Beta blocker and high dose  statin -  NTG prn chest pain -  Maalox prn and consider starting PPI -  ECHO (could be done as outpatient) -  Outpatient follow up  Gout vs. Pseudogout of the right wrist -  Check uric acid -  Avoid NSAIDS in the setting of possible ACS (although less likely) -  Start colchicine 0.6 mg once daily -  Start prednisone taper -  OT consult  -  Follow-up with primary care doctor  Diarrhea likely related to colchicine and resolved.  No risk factors for C. Diff -  Monitor for recurrent diarrhea on colchine  Hypertension, blood pressure stable, continue atenolol and losartan  Paroxysmal atrial fibrillation, rate controlled -  CHADs2vasc = 4   -  HASBLED = 3 (or 4 if INR labile) -  Continue warfarin dosed by pharmacy -  Continue beta blocker   History of pulmonary embolism many years ago, resuming Coumadin  Headache, chronic intermittent problem, continue Fioricet  Chronic kidney disease stage 3, baseline creatinine 0.9 due to high blood pressure and age -  Minimize nephrotoxins and renally dose medications  Diet:  Healthy heart Access:  PIV IVF:  No Proph:  Lovenox  Code Status:  DO NOT RESUSCITATE Family Communication:  Patient and her granddaughter Disposition Plan: Admit to  telemetry, observation  Time spent: 60 min Sara Butler [facility] Pager [phone]  If 7PM-7AM, please contact night-coverage [email or web address] Password [email or web address] 07/04/2014, 1:24 PM

## 2014-07-04 NOTE — Progress Notes (Signed)
ANTICOAGULATION CONSULT NOTE - Initial Consult  Pharmacy Consult for Warfarin Indication: atrial fibrillation  No Known Allergies  Patient Measurements: Height: 5\' 5"  (165.1 cm) Weight: 129 lb 11.2 oz (58.832 kg) IBW/kg (Calculated) : 57  Vital Signs: Temp: 99.3 F (37.4 C) (05/10 1441) Temp Source: Oral (05/10 1441) BP: 153/72 mmHg (05/10 1441) Pulse Rate: 72 (05/10 1441)  Labs:  Recent Labs  07/04/14 0959 07/04/14 1000  HGB 13.0  --   HCT 40.5  --   PLT 172  --   LABPROT  --  18.4*  INR  --  1.51*  CREATININE 0.98  --     Estimated Creatinine Clearance: 39.1 mL/min (by C-G formula based on Cr of 0.98).   Medical History: Past Medical History  Diagnosis Date  . Hypertension   . Cervical cancer   . Atrial fibrillation   . Bleeding from anus   . Diverticulitis of colon   . Hyperlipidemia   . GERD (gastroesophageal reflux disease)   . Pulmonary embolism   . Gout     Medications:  Scheduled:  . aspirin  324 mg Oral Once  . [START ON 07/05/2014] aspirin EC  81 mg Oral Daily  . atenolol  50 mg Oral Daily  . atorvastatin  80 mg Oral q1800  . colchicine  0.6 mg Oral Daily  . gi cocktail  30 mL Oral Once  . losartan  50 mg Oral Daily  . potassium chloride  40 mEq Oral Once  . predniSONE  50 mg Oral Q breakfast  . Warfarin - Pharmacist Dosing Inpatient   Does not apply q1800   Infusions:    Assessment:  81 yr female on warfarin PTA with PMH including AFib, PE, HTN, gout and hyperlipidemia came to ED with complaints of hand pain.  During time in the ED, patient expressed chest pain.   CT Angio = no pulm embolism  Warfarin regimen PTA = 5mg  daily except 7.5mg  on Mondays & Wednesdays (Last dose taken on 5/9 @ 18:00)  Patient being admitted for atypical chest pain and gout vs pseudogout  Pharmacy consulted to continue dosing of warfarin during hospitalization  Goal of Therapy:  INR 2-3   Plan:   Warfarin 7.5mg  po x 1 tonight  Check daily  PT/INR  Tarshia Kot, Toribio Harbour, PharmD 07/04/2014,2:56 PM

## 2014-07-04 NOTE — ED Notes (Addendum)
Pt states she has had rt hand pain with swelling since Friday. Called MD and given order for colchicine.  Pt taking med but hand continues to swell.  Has had previous hx of same in left hand.  Pt also added c/o chest pain starting this morning with headache and weakness.  Late reasons given during assessment in triage.  Pt concerned symptoms coming from new medicine b/c she reads that it can cause death on the paperwork.

## 2014-07-04 NOTE — ED Notes (Signed)
Patient transported to X-ray 

## 2014-07-05 ENCOUNTER — Observation Stay (HOSPITAL_COMMUNITY): Payer: Commercial Managed Care - HMO

## 2014-07-05 DIAGNOSIS — E785 Hyperlipidemia, unspecified: Secondary | ICD-10-CM | POA: Diagnosis not present

## 2014-07-05 DIAGNOSIS — N183 Chronic kidney disease, stage 3 (moderate): Secondary | ICD-10-CM | POA: Diagnosis not present

## 2014-07-05 DIAGNOSIS — R072 Precordial pain: Secondary | ICD-10-CM | POA: Diagnosis not present

## 2014-07-05 DIAGNOSIS — M11231 Other chondrocalcinosis, right wrist: Secondary | ICD-10-CM | POA: Diagnosis not present

## 2014-07-05 DIAGNOSIS — M109 Gout, unspecified: Secondary | ICD-10-CM | POA: Diagnosis not present

## 2014-07-05 DIAGNOSIS — M19031 Primary osteoarthritis, right wrist: Secondary | ICD-10-CM | POA: Diagnosis not present

## 2014-07-05 DIAGNOSIS — I129 Hypertensive chronic kidney disease with stage 1 through stage 4 chronic kidney disease, or unspecified chronic kidney disease: Secondary | ICD-10-CM | POA: Diagnosis not present

## 2014-07-05 DIAGNOSIS — K219 Gastro-esophageal reflux disease without esophagitis: Secondary | ICD-10-CM | POA: Diagnosis not present

## 2014-07-05 DIAGNOSIS — R0789 Other chest pain: Secondary | ICD-10-CM | POA: Diagnosis not present

## 2014-07-05 DIAGNOSIS — R51 Headache: Secondary | ICD-10-CM | POA: Diagnosis not present

## 2014-07-05 LAB — BASIC METABOLIC PANEL
ANION GAP: 8 (ref 5–15)
BUN: 19 mg/dL (ref 6–20)
CHLORIDE: 110 mmol/L (ref 101–111)
CO2: 22 mmol/L (ref 22–32)
Calcium: 9.7 mg/dL (ref 8.9–10.3)
Creatinine, Ser: 0.86 mg/dL (ref 0.44–1.00)
GFR calc Af Amer: >60 mL/min (ref >60)
Glucose, Bld: 145 mg/dL — ABNORMAL HIGH (ref 70–99)
Potassium: 4.1 mmol/L (ref 3.5–5.1)
SODIUM: 140 mmol/L (ref 135–145)

## 2014-07-05 LAB — HEMOGLOBIN A1C
HEMOGLOBIN A1C: 5.8 % — AB (ref 4.8–5.6)
Mean Plasma Glucose: 120 mg/dL

## 2014-07-05 LAB — TROPONIN I

## 2014-07-05 LAB — PROTIME-INR
INR: 2.07 — AB (ref 0.00–1.49)
Prothrombin Time: 23.4 seconds — ABNORMAL HIGH (ref 11.6–15.2)

## 2014-07-05 MED ORDER — PREDNISONE 10 MG PO TABS: ORAL_TABLET | ORAL | Status: DC

## 2014-07-05 MED ORDER — WARFARIN SODIUM 5 MG PO TABS: 7.5000 mg | ORAL_TABLET | Freq: Once | ORAL | Status: DC

## 2014-07-05 MED ORDER — ENSURE ENLIVE PO LIQD: 237.0000 mL | Freq: Two times a day (BID) | ORAL | Status: DC

## 2014-07-05 NOTE — Progress Notes (Signed)
Initial Nutrition Assessment  DOCUMENTATION CODES:  Non-severe (moderate) malnutrition in context of chronic illness  INTERVENTION:  Ensure Enlive (each supplement provides 350kcal and 20 grams of protein) BID  NUTRITION DIAGNOSIS:  Malnutrition related to chronic illness as evidenced by mild depletion of body fat, mild depletion of muscle mass.  GOAL:  Patient will meet greater than or equal to 90% of their needs  MONITOR:  Supplement acceptance, Labs, Weight trends, I & O's  REASON FOR ASSESSMENT:  Malnutrition Screening Tool    ASSESSMENT: Patient is a 81 y.o. year-old female with history of hypertension, hyperlipidemia, atrial fibrillation, pulmonary embolism, gout, GERD who presents with right hand pain. The patient was last at their baseline health until approximately 4 days prior to admission. She lives alone and is independent for ADLs  Pt at bedside preparing to be discharged at time of visit. Pt states she loved the food hear and ate great last night and this morning. Pt states she eats well at home, sometimes drinks Boost. Suggested trying Ensure Enlive for higher protein, will order to try before she leaves. Pt states her usual weight is 135 Lb and that she has lost a lot of weight (6 Lb in the last year or so, not significant for time frame). Will continue to monitor if not discharged today. Labs reviewed: Glu 145  Height:  Ht Readings from Last 1 Encounters:  07/04/14 5\' 5"  (1.651 m)    Weight:  Wt Readings from Last 1 Encounters:  07/04/14 129 lb 11.2 oz (58.832 kg)    Ideal Body Weight:  57 kg  Wt Readings from Last 10 Encounters:  07/04/14 129 lb 11.2 oz (58.832 kg)  07/30/11 142 lb (64.411 kg)  04/26/11 136 lb 3.9 oz (61.8 kg)    BMI:  Body mass index is 21.58 kg/(m^2).  Estimated Nutritional Needs:  Kcal:  1400 - 1600  Protein:  70 - 80 g  Fluid:  1.6 L  Skin:  Reviewed, no issues  Diet Order:  Diet Heart Room service appropriate?:  Yes; Fluid consistency:: Thin Diet - low sodium heart healthy  EDUCATION NEEDS:  No education needs identified at this time   Intake/Output Summary (Last 24 hours) at 07/05/14 1022 Last data filed at 07/05/14 0440  Gross per 24 hour  Intake    240 ml  Output      0 ml  Net    240 ml    Last BM:  5/10  Jamorion Gomillion A. [facility] Dietetic Intern Pager: [phone] 07/05/2014 10:27 AM

## 2014-07-05 NOTE — Progress Notes (Signed)
ANTICOAGULATION CONSULT NOTE - Follow up [facility] for Warfarin Indication: atrial fibrillation  No Known Allergies  Patient Measurements: Height: 5\' 5"  (165.1 cm) Weight: 129 lb 11.2 oz (58.832 kg) IBW/kg (Calculated) : 57  Vital Signs: Temp: 97.9 F (36.6 C) (05/11 0449) Temp Source: Oral (05/11 0449) BP: 139/58 mmHg (05/11 0449) Pulse Rate: 61 (05/11 0449)  Labs:  Recent Labs  07/04/14 0959 07/04/14 1000 07/04/14 1539 07/04/14 2108 07/05/14 0241  HGB 13.0  --   --   --   --   HCT 40.5  --   --   --   --   PLT 172  --   --   --   --   LABPROT  --  18.4*  --   --  23.4*  INR  --  1.51*  --   --  2.07*  CREATININE 0.98  --   --   --  0.86  TROPONINI  --   --  <0.03 <0.03 <0.03    Estimated Creatinine Clearance: 44.6 mL/min (by C-G formula based on Cr of 0.86).   Assessment: 81 yo F admitted 5/10 with right hand and wrist pain, swelling, warmth, and redness.  She also complains of chest pain.  PMH including chronic warfarin anticoagulation, AFib, PE, HTN, gout and hyperlipidemia.   CT Angio showed no PE.  Pharmacy consulted to continue dosing of warfarin during hospitalization.  Home warfarin regimen 5mg  daily except 7.5mg  on Mondays & Wednesdays.  INR subtherpaeutic at 1.5 on admission.   INR 2.07, therapeutic after large increase  CBC: WNL (5/10)  Diet: Heart healthy  Drug-drug interactions: Prednisone and diarrhea induced by colchicine may increase INR  Goal of Therapy:  INR 2-3   Plan:   Warfarin 7.5mg  po x 1 tonight per home regimen.  Check daily PT/INR  Gretta Arab PharmD, BCPS Pager [phone] 07/05/2014 8:26 AM

## 2014-07-05 NOTE — Discharge Summary (Signed)
Physician Discharge Summary  Sara Butler:[id number] DOB: April 01, 1928 DOA: 07/04/2014  PCP: Reginia Naas, MD  Admit date: 07/04/2014 Discharge date: 07/05/2014  Recommendations for Outpatient Follow-up:  1. Pt will need to follow up with PCP in 2-3 weeks post discharge 2. Please obtain BMP to evaluate electrolytes and kidney function 3. Please also check CBC to evaluate Hg and Hct levels  Discharge Diagnoses:  Principal Problem:   Chest pain Active Problems:   Hypertension   Atrial fibrillation   Hyperlipidemia   GERD (gastroesophageal reflux disease)   Pseudogout of right wrist   Acute gout  Discharge Condition: Stable  Diet recommendation: Heart healthy diet discussed in details   History of present illness:  81 y.o. year-old female with history of hypertension, hyperlipidemia, atrial fibrillation, pulmonary embolism, gout, GERD who presents with right hand pain. The patient was last at their baseline health until approximately 4 days prior to admission. She lives alone and is independent for ADLs.  She states she developed fairly acute onset right hand and wrist swelling, warmth, redness, and pain. Her hand swelled up so much that she had difficulty moving her fingers and she was in severe 10 out of 10 pain. She talked to her primary care doctor's office on the phone and they gave her prescription for colchicine and she took 2 tabs one day PTA followed by 1 tab a proximally 1 hour later as instructed. After taking her pills, she developed watery diarrhea about 3 times, which is since resolved. She states that the redness, pain, and swelling have gotten much better.  In ED, she developed chest pain and was referred for an admission. Troponin was negative, EKG demonstrated rate controlled atrial fibrillation without ST segment changes or T-wave inversions. Because her INR was subtherapeutic at 1.5, she underwent CT angio chest which demonstrated no evidence of  pulmonary embolism.  Hospital Course:   Atypical chest pain - low suspicion for cardiac pain however HEART score is 4 (2 for age and 2 for risk factors) - ACS ruled out with negative troponins, pt denies chest pain this AM and wants to go home  - She refused aspirin - advised to follow up with her PCP as noted above   Gout vs. Pseudogout  - of the right wrist -Started colchicine 0.6 mg once daily as well as prednisone tapering  - Follow-up with primary care doctor  Diarrhea likely related to colchicine and resolved. - No risk factors for C. Diff  Hypertension, blood pressure stable, continue atenolol and losartan  Paroxysmal atrial fibrillation, rate controlled - CHADs2vasc = 4  - Continue warfarin   History of pulmonary embolism many years ago, resuming Coumadin  Headache, chronic intermittent problem, continue Fioricet  Chronic kidney disease stage 3, baseline creatinine 0.9 due to high blood pressure and age  Procedures/Studies: Dg Chest 2 View  07/04/2014   CLINICAL DATA:  Coughing recently for few days, history hypertension, former smoker, atrial fibrillation, GERD  EXAM: CHEST  2 VIEW  COMPARISON:  11/11/2012  FINDINGS: Normal heart size, mediastinal contours and pulmonary vascularity.  Lungs clear.  No pleural effusion or pneumothorax.  Bones demineralized with dextro convex scoliosis.  IMPRESSION: No acute abnormalities.   Electronically Signed   By: Lavonia Dana M.D.   On: 07/04/2014 10:06   Dg Wrist Complete Right  07/04/2014   CLINICAL DATA:  81 year old female with right posterior and radial hand and wrist pain upon waking. Initial encounter.  EXAM: RIGHT WRIST - COMPLETE 3+ VIEW  COMPARISON:  None.  FINDINGS: Chondrocalcinosis at the right wrist. Distal radius and ulna appear intact. There is soft tissue swelling about the wrist. There is severe joint space loss, subchondral sclerosis, and bulky osteophytosis at the basal joint of the right thumb. Elsewhere  carpal bone alignment and joint spaces within normal limits. The metacarpals appear intact. No acute osseous abnormality identified.  IMPRESSION: Advanced degenerative changes at the right wrist including Chondrocalcinosis which can be seen in the setting of calcium pyrophosphate deposition disease.  Soft tissue swelling but no acute osseous abnormality is identified.   Electronically Signed   By: Genevie Ann M.D.   On: 07/04/2014 10:05   Ct Angio Chest Pe W/cm &/or Wo Cm  07/04/2014   CLINICAL DATA:  RIGHT hand swelling, chest pain beginning this morning, headache, weakness, history pulmonary embolism, cervical cancer, atrial fibrillation, hypertension, hyperlipidemia  EXAM: CT ANGIOGRAPHY CHEST WITH CONTRAST  TECHNIQUE: Multidetector CT imaging of the chest was performed using the standard protocol during bolus administration of intravenous contrast. Multiplanar CT image reconstructions and MIPs were obtained to evaluate the vascular anatomy.  CONTRAST:  148mL OMNIPAQUE IOHEXOL 350 MG/ML SOLN  COMPARISON:  04/26/2011  FINDINGS: Scattered atherosclerotic calcifications aorta and coronary arteries.  No aortic aneurysm or dissection.  Minimally prominent subcarinal lymph node 12 mm short axis image 49 unchanged.  Small cystic lesion with adjacent calcifications at RIGHT lobe liver 14 x 13 mm image 86 unchanged.  Remaining visualized portions of upper abdomen unremarkable.  Pulmonary arteries well opacified and patent.  No evidence of pulmonary embolism.  RIGHT middle lobe nodule 4 mm diameter image 63, previously 3 mm.  Calcified granuloma RIGHT lower lobe image 62 with additional tiny noncalcified RIGHT lower lobe nodule image 63.  Tiny LEFT lower lobe nodule image 58 stable.  Suspect small posterior RIGHT diaphragmatic diaphragmatic defect containing fat.  No acute infiltrate, pleural effusion, or pneumothorax.  Osseous structures unremarkable.  Review of the MIP images confirms the above findings.  IMPRESSION: No  evidence of pulmonary embolism.  Multiple tiny pulmonary nodules, majority stable, single nodule RIGHT middle lobe 4 mm diameter, previously 3 mm diameter, though such a slight difference in size between the 2 studies could be related to differences in slice averaging; followup CT chest recommended in 6 months to assess stability.   Electronically Signed   By: Lavonia Dana M.D.   On: 07/04/2014 12:25   Dg Hand Complete Right  07/04/2014   CLINICAL DATA:  Awoke this morning with RIGHT posterior and radial hand pain and wrist pain, feels like she has a knot at the ulnar side of her wrist  EXAM: RIGHT HAND - COMPLETE 3+ VIEW  COMPARISON:  None  FINDINGS: Osseous demineralization diffusely.  Advanced degenerative changes first CMC joint.  Scattered mild degenerative changes of interphalangeal joints greatest at DIP joint index finger.  Mild degenerative changes at STT joint, second MCP joint, radiocarpal joint.  Chondrocalcinosis at carpus question CPPD.  No acute fracture, dislocation, or bone destruction.  Mild dorsal soft tissue swelling.  IMPRESSION: Scattered degenerative changes as above with question CPPD at carpus.  No acute abnormalities.   Electronically Signed   By: Lavonia Dana M.D.   On: 07/04/2014 10:05    Discharge Exam: Filed Vitals:   07/05/14 0449  BP: 139/58  Pulse: 61  Temp: 97.9 F (36.6 C)  Resp: 19   Filed Vitals:   07/04/14 1355 07/04/14 1441 07/04/14 2111 07/05/14 0449  BP: 142/68 153/72 139/72 139/58  Pulse: 76 72 59 61  Temp:  99.3 F (37.4 C) 98.2 F (36.8 C) 97.9 F (36.6 C)  TempSrc:  Oral Oral Oral  Resp: 16 20 18 19   Height:  5\' 5"  (1.651 m)    Weight:  58.832 kg (129 lb 11.2 oz)    SpO2: 97% 99% 97% 100%    General: Pt is alert, follows commands appropriately, not in acute distress Cardiovascular: Regular rate and rhythm, no rubs, no gallops Respiratory: Clear to auscultation bilaterally, no wheezing, no crackles, no rhonchi Abdominal: Soft, non tender, non  distended, bowel sounds +, no guarding Extremities: no edema, no cyanosis, pulses palpable bilaterally DP and PT Neuro: Grossly nonfocal  Discharge Instructions  Discharge Instructions    Diet - low sodium heart healthy    Complete by:  As directed      Increase activity slowly    Complete by:  As directed             Medication List    TAKE these medications        atenolol 50 MG tablet  Commonly known as:  TENORMIN  Take 50 mg by mouth daily.     butalbital-acetaminophen-caffeine 50-325-40 MG per tablet  Commonly known as:  FIORICET  Take 1-2 tablets by mouth every 6 (six) hours as needed for headache.     colchicine 0.6 MG tablet  Take 0.6 mg by mouth daily. Take 2 tablets now, then 1 tablet an hour later, then take as directed- pt states she received no other instructions on taking medication     losartan 50 MG tablet  Commonly known as:  COZAAR  Take 50 mg by mouth daily.     predniSONE 10 MG tablet  Commonly known as:  DELTASONE  Take 50 mg tablet today and taper down by 10 mg daily until completed     warfarin 5 MG tablet  Commonly known as:  COUMADIN  Take 5-7.5 mg by mouth daily. On Monday and Wednesday take 1 1/2 tablet (7.5mg ) and on all other days take 1 tablet (5mg )            Follow-up Information    Follow up with Reginia Naas, MD.   Specialty:  Family Medicine   Contact information:   [place] [address] [phone]       Call Faye Ramsay, MD.   Specialty:  Internal Medicine   Why:  As needed   Contact information:   [address] [place] [address] [phone]        The results of significant diagnostics from this hospitalization (including imaging, microbiology, ancillary and laboratory) are listed below for reference.     Microbiology: No results found for this or any previous visit (from the past 240 hour(s)).   Labs: Basic Metabolic Panel:  Recent  Labs Lab 07/04/14 0959 07/05/14 0241  NA 137 140  K 3.4* 4.1  CL 104 110  CO2 21* 22  GLUCOSE 95 145*  BUN 14 19  CREATININE 0.98 0.86  CALCIUM 9.9 9.7   CBC:  Recent Labs Lab 07/04/14 0959  WBC 8.7  HGB 13.0  HCT 40.5  MCV 86.5  PLT 172   Cardiac Enzymes:  Recent Labs Lab 07/04/14 1539 07/04/14 2108 07/05/14 0241  TROPONINI <0.03 <0.03 <0.03   SIGNED: Time coordinating discharge: 30 minutes  Faye Ramsay, MD  [facility] 07/05/2014, 9:21 AM Pager [phone]  If 7PM-7AM, please contact  night-coverage [email or web address] Password [email or web address]

## 2014-07-05 NOTE — Discharge Instructions (Signed)

## 2014-07-21 DIAGNOSIS — Z7901 Long term (current) use of anticoagulants: Secondary | ICD-10-CM | POA: Diagnosis not present

## 2014-07-21 DIAGNOSIS — Z86711 Personal history of pulmonary embolism: Secondary | ICD-10-CM | POA: Diagnosis not present

## 2014-07-27 DIAGNOSIS — I4891 Unspecified atrial fibrillation: Secondary | ICD-10-CM | POA: Diagnosis not present

## 2014-07-27 DIAGNOSIS — Z7901 Long term (current) use of anticoagulants: Secondary | ICD-10-CM | POA: Diagnosis not present

## 2014-07-31 DIAGNOSIS — Z7901 Long term (current) use of anticoagulants: Secondary | ICD-10-CM | POA: Diagnosis not present

## 2014-07-31 DIAGNOSIS — R350 Frequency of micturition: Secondary | ICD-10-CM | POA: Diagnosis not present

## 2014-07-31 DIAGNOSIS — I480 Paroxysmal atrial fibrillation: Secondary | ICD-10-CM | POA: Diagnosis not present

## 2014-07-31 DIAGNOSIS — E785 Hyperlipidemia, unspecified: Secondary | ICD-10-CM | POA: Diagnosis not present

## 2014-07-31 DIAGNOSIS — I10 Essential (primary) hypertension: Secondary | ICD-10-CM | POA: Diagnosis not present

## 2014-07-31 DIAGNOSIS — Z86711 Personal history of pulmonary embolism: Secondary | ICD-10-CM | POA: Diagnosis not present

## 2014-07-31 DIAGNOSIS — M109 Gout, unspecified: Secondary | ICD-10-CM | POA: Diagnosis not present

## 2014-07-31 DIAGNOSIS — R197 Diarrhea, unspecified: Secondary | ICD-10-CM | POA: Diagnosis not present

## 2014-08-30 DIAGNOSIS — Z7901 Long term (current) use of anticoagulants: Secondary | ICD-10-CM | POA: Diagnosis not present

## 2014-08-30 DIAGNOSIS — I4891 Unspecified atrial fibrillation: Secondary | ICD-10-CM | POA: Diagnosis not present

## 2014-09-18 DIAGNOSIS — Z86711 Personal history of pulmonary embolism: Secondary | ICD-10-CM | POA: Diagnosis not present

## 2014-09-18 DIAGNOSIS — Z7901 Long term (current) use of anticoagulants: Secondary | ICD-10-CM | POA: Diagnosis not present

## 2014-10-06 DIAGNOSIS — Z7901 Long term (current) use of anticoagulants: Secondary | ICD-10-CM | POA: Diagnosis not present

## 2014-10-06 DIAGNOSIS — Z86711 Personal history of pulmonary embolism: Secondary | ICD-10-CM | POA: Diagnosis not present

## 2014-10-20 DIAGNOSIS — Z7901 Long term (current) use of anticoagulants: Secondary | ICD-10-CM | POA: Diagnosis not present

## 2014-10-20 DIAGNOSIS — Z86711 Personal history of pulmonary embolism: Secondary | ICD-10-CM | POA: Diagnosis not present

## 2014-11-22 DIAGNOSIS — I10 Essential (primary) hypertension: Secondary | ICD-10-CM | POA: Diagnosis not present

## 2014-11-22 DIAGNOSIS — Z7901 Long term (current) use of anticoagulants: Secondary | ICD-10-CM | POA: Diagnosis not present

## 2014-11-22 DIAGNOSIS — R609 Edema, unspecified: Secondary | ICD-10-CM | POA: Diagnosis not present

## 2014-12-10 ENCOUNTER — Emergency Department (HOSPITAL_COMMUNITY)
Admission: EM | Admit: 2014-12-10 | Discharge: 2014-12-11 | Disposition: A | Discharge status: Home / Self Care | Payer: Commercial Managed Care - HMO | Attending: Emergency Medicine | Admitting: Emergency Medicine

## 2014-12-10 DIAGNOSIS — Z87891 Personal history of nicotine dependence: Secondary | ICD-10-CM | POA: Insufficient documentation

## 2014-12-10 DIAGNOSIS — Z8719 Personal history of other diseases of the digestive system: Secondary | ICD-10-CM | POA: Insufficient documentation

## 2014-12-10 DIAGNOSIS — I10 Essential (primary) hypertension: Secondary | ICD-10-CM | POA: Insufficient documentation

## 2014-12-10 DIAGNOSIS — M109 Gout, unspecified: Secondary | ICD-10-CM | POA: Diagnosis not present

## 2014-12-10 DIAGNOSIS — Z8639 Personal history of other endocrine, nutritional and metabolic disease: Secondary | ICD-10-CM | POA: Diagnosis not present

## 2014-12-10 DIAGNOSIS — M79661 Pain in right lower leg: Secondary | ICD-10-CM | POA: Diagnosis not present

## 2014-12-10 DIAGNOSIS — Z79899 Other long term (current) drug therapy: Secondary | ICD-10-CM | POA: Insufficient documentation

## 2014-12-10 DIAGNOSIS — Z7901 Long term (current) use of anticoagulants: Secondary | ICD-10-CM | POA: Insufficient documentation

## 2014-12-10 DIAGNOSIS — M79606 Pain in leg, unspecified: Secondary | ICD-10-CM

## 2014-12-10 DIAGNOSIS — Z86711 Personal history of pulmonary embolism: Secondary | ICD-10-CM | POA: Diagnosis not present

## 2014-12-10 DIAGNOSIS — M79662 Pain in left lower leg: Secondary | ICD-10-CM | POA: Insufficient documentation

## 2014-12-10 DIAGNOSIS — I4891 Unspecified atrial fibrillation: Secondary | ICD-10-CM | POA: Diagnosis not present

## 2014-12-10 DIAGNOSIS — Z8541 Personal history of malignant neoplasm of cervix uteri: Secondary | ICD-10-CM | POA: Insufficient documentation

## 2014-12-10 DIAGNOSIS — M79605 Pain in left leg: Secondary | ICD-10-CM | POA: Diagnosis not present

## 2014-12-11 ENCOUNTER — Ambulatory Visit (HOSPITAL_COMMUNITY): Payer: No Typology Code available for payment source

## 2014-12-11 ENCOUNTER — Encounter (HOSPITAL_COMMUNITY): Payer: Self-pay | Admitting: Oncology

## 2014-12-11 ENCOUNTER — Ambulatory Visit (HOSPITAL_BASED_OUTPATIENT_CLINIC_OR_DEPARTMENT_OTHER)
Admission: RE | Admit: 2014-12-11 | Discharge: 2014-12-11 | Disposition: A | Discharge status: Home / Self Care | Payer: Commercial Managed Care - HMO | Source: Ambulatory Visit | Attending: Emergency Medicine | Admitting: Emergency Medicine

## 2014-12-11 DIAGNOSIS — M109 Gout, unspecified: Secondary | ICD-10-CM | POA: Diagnosis not present

## 2014-12-11 DIAGNOSIS — M79609 Pain in unspecified limb: Secondary | ICD-10-CM | POA: Diagnosis not present

## 2014-12-11 DIAGNOSIS — M7989 Other specified soft tissue disorders: Secondary | ICD-10-CM

## 2014-12-11 DIAGNOSIS — I4891 Unspecified atrial fibrillation: Secondary | ICD-10-CM | POA: Diagnosis not present

## 2014-12-11 DIAGNOSIS — M79662 Pain in left lower leg: Secondary | ICD-10-CM | POA: Diagnosis not present

## 2014-12-11 DIAGNOSIS — Z7901 Long term (current) use of anticoagulants: Secondary | ICD-10-CM

## 2014-12-11 DIAGNOSIS — M79661 Pain in right lower leg: Secondary | ICD-10-CM | POA: Diagnosis not present

## 2014-12-11 DIAGNOSIS — M79604 Pain in right leg: Secondary | ICD-10-CM | POA: Insufficient documentation

## 2014-12-11 DIAGNOSIS — Z86711 Personal history of pulmonary embolism: Secondary | ICD-10-CM | POA: Insufficient documentation

## 2014-12-11 DIAGNOSIS — M79605 Pain in left leg: Secondary | ICD-10-CM

## 2014-12-11 DIAGNOSIS — Z79899 Other long term (current) drug therapy: Secondary | ICD-10-CM | POA: Diagnosis not present

## 2014-12-11 DIAGNOSIS — I10 Essential (primary) hypertension: Secondary | ICD-10-CM | POA: Diagnosis not present

## 2014-12-11 DIAGNOSIS — Z87891 Personal history of nicotine dependence: Secondary | ICD-10-CM | POA: Diagnosis not present

## 2014-12-11 LAB — CBC WITH DIFFERENTIAL/PLATELET
Basophils Absolute: 0 K/uL (ref 0.0–0.1)
Basophils Relative: 0 %
Eosinophils Absolute: 0.2 K/uL (ref 0.0–0.7)
Eosinophils Relative: 3 %
HCT: 37.4 % (ref 36.0–46.0)
Hemoglobin: 11.9 g/dL — ABNORMAL LOW (ref 12.0–15.0)
Lymphocytes Relative: 62 %
Lymphs Abs: 4.5 K/uL — ABNORMAL HIGH (ref 0.7–4.0)
MCH: 27.5 pg (ref 26.0–34.0)
MCHC: 31.8 g/dL (ref 30.0–36.0)
MCV: 86.4 fL (ref 78.0–100.0)
Monocytes Absolute: 0.3 K/uL (ref 0.1–1.0)
Monocytes Relative: 4 %
Neutro Abs: 2.3 K/uL (ref 1.7–7.7)
Neutrophils Relative %: 31 %
Platelets: 166 K/uL (ref 150–400)
RBC: 4.33 MIL/uL (ref 3.87–5.11)
RDW: 15.4 % (ref 11.5–15.5)
WBC: 7.3 K/uL (ref 4.0–10.5)

## 2014-12-11 LAB — I-STAT CHEM 8, ED
BUN: 8 mg/dL (ref 6–20)
Calcium, Ion: 1.27 mmol/L (ref 1.13–1.30)
Chloride: 106 mmol/L (ref 101–111)
Creatinine, Ser: 1.2 mg/dL — ABNORMAL HIGH (ref 0.44–1.00)
Glucose, Bld: 102 mg/dL — ABNORMAL HIGH (ref 65–99)
HCT: 40 % (ref 36.0–46.0)
Hemoglobin: 13.6 g/dL (ref 12.0–15.0)
Potassium: 3.8 mmol/L (ref 3.5–5.1)
Sodium: 144 mmol/L (ref 135–145)
TCO2: 25 mmol/L (ref 0–100)

## 2014-12-11 LAB — PROTIME-INR
INR: 1.27 (ref 0.00–1.49)
Prothrombin Time: 16.1 s — ABNORMAL HIGH (ref 11.6–15.2)

## 2014-12-11 MED ORDER — WARFARIN SODIUM 5 MG PO TABS
5.0000 mg | ORAL_TABLET | ORAL | Status: AC
  Administered 2014-12-11: 5 mg via ORAL
  Filled 2014-12-11: qty 1

## 2014-12-11 MED ORDER — KETOROLAC TROMETHAMINE 60 MG/2ML IM SOLN
60.0000 mg | Freq: Once | INTRAMUSCULAR | Status: AC
  Administered 2014-12-11: 60 mg via INTRAMUSCULAR
  Filled 2014-12-11: qty 2

## 2014-12-11 MED ORDER — DICLOFENAC SODIUM 1 % TD GEL: 4.0000 g | Freq: Four times a day (QID) | TRANSDERMAL | Status: DC

## 2014-12-11 MED ORDER — WARFARIN - PHARMACIST DOSING INPATIENT: Freq: Every day | Status: DC

## 2014-12-11 MED ORDER — ENOXAPARIN SODIUM 60 MG/0.6ML ~~LOC~~ SOLN
1.0000 mg/kg | Freq: Once | SUBCUTANEOUS | Status: AC
  Administered 2014-12-11: 60 mg via SUBCUTANEOUS
  Filled 2014-12-11: qty 0.6

## 2014-12-11 NOTE — ED Notes (Signed)
Pt presents d/t left calf pain x 1 week.  Per pt she was unable to sleep d/t the pain.  Pt describes pain as shooting down her calf. Does not get worse with walking pt reports walking makes it feel better.

## 2014-12-11 NOTE — Discharge Instructions (Signed)

## 2014-12-11 NOTE — ED Provider Notes (Signed)
CSN: [id number]     Arrival date & time 12/10/14  2353 History  By signing my name below, I, Terressa Koyanagi, attest that this documentation has been prepared under the direction and in the presence of Uriyah Raska, MD. Electronically Signed: Terressa Koyanagi, ED Scribe. 12/11/2014. 3:12 AM.  Chief Complaint  Patient presents with  . Leg Pain   Patient is a 81 y.o. female presenting with leg pain. The history is provided by the patient. No language interpreter was used.  Leg Pain Location:  Leg Injury: no   Leg location:  L lower leg and R lower leg Pain details:    Quality:  Aching   Radiates to:  Does not radiate   Severity:  Moderate   Onset quality:  Gradual   Timing:  Constant   Progression:  Unchanged Chronicity:  New Dislocation: no   Relieved by:  Nothing Worsened by:  Nothing tried Ineffective treatments:  None tried Associated symptoms: no back pain, no decreased ROM, no fatigue, no fever, no itching, no muscle weakness, no neck pain, no numbness, no stiffness, no swelling and no tingling   Risk factors: no concern for non-accidental trauma    PCP: Reginia Naas, MD HPI Comments: CHEYRL BULEY is a 81 y.o. female, with PMHx noted below including gout, who presents to the Emergency Department complaining of atraumatic, 10/10, shooting bilateral calf pain with right worse than left onset 5 days ago. Pt reports taking tylenol at home without relief. Pt reports she is on coumadin and lasix.   Past Medical History  Diagnosis Date  . Hypertension   . Cervical cancer ([place])   . Atrial fibrillation ([place])   . Bleeding from anus   . Diverticulitis of colon   . Hyperlipidemia   . GERD (gastroesophageal reflux disease)   . Pulmonary embolism ([place])   . Gout    Past Surgical History  Procedure Laterality Date  . Abdominal hysterectomy    . Back surgery     Family History  Problem Relation Age of Onset  . Diabetes Brother   . Heart failure Mother   . Cancer Neg Hx     Social History  Substance Use Topics  . Smoking status: Former Smoker -- 0.10 packs/day for 58 years    Types: Cigarettes  . Smokeless tobacco: Never Used  . Alcohol Use: No   OB History    No data available     Review of Systems  Constitutional: Negative for fever and fatigue.  Respiratory: Negative for shortness of breath and wheezing.   Cardiovascular: Negative for chest pain, palpitations and leg swelling.  Musculoskeletal: Positive for arthralgias (left calf pain). Negative for back pain, stiffness and neck pain.  Skin: Negative for itching.  All other systems reviewed and are negative.  Allergies  Review of patient's allergies indicates no known allergies.  Home Medications   Prior to Admission medications   Medication Sig Start Date End Date Taking? Authorizing Provider  atenolol (TENORMIN) 50 MG tablet Take 50 mg by mouth daily.    Yes Historical Provider, MD  losartan (COZAAR) 50 MG tablet Take 50 mg by mouth daily.   Yes Historical Provider, MD  warfarin (COUMADIN) 5 MG tablet Take 5-7.5 mg by mouth daily. On Monday and Wednesday take 1 1/2 tablet (7.5mg ) and on all other days take 1 tablet (5mg )   Yes Historical Provider, MD  colchicine 0.6 MG tablet Take 0.6 mg by mouth daily as needed. For gout  Historical Provider, MD   Triage Vitals: BP 176/90 mmHg  Pulse 73  Temp(Src) 97.3 F (36.3 C) (Oral)  Resp 20  SpO2 100% Physical Exam  Constitutional: She is oriented to person, place, and time. She appears well-developed and well-nourished.  HENT:  Head: Normocephalic.  Mouth/Throat: Uvula is midline, oropharynx is clear and moist and mucous membranes are normal. No oropharyngeal exudate.  Eyes: EOM are normal. Pupils are equal, round, and reactive to light.  Neck: Normal range of motion. Neck supple.  Cardiovascular: Normal rate, regular rhythm and intact distal pulses.   Pulmonary/Chest: Effort normal and breath sounds normal. No respiratory distress. She  has no wheezes. She has no rales.  Abdominal: Soft. Bowel sounds are normal. She exhibits no distension. There is no tenderness. There is no rebound and no guarding.  Musculoskeletal: Normal range of motion. She exhibits no edema or tenderness.  BLE: No cords, no fluctuance, no warmth all compartments soft, legs not swollen, anterior and posterior drawer test.   Neurological: She is alert and oriented to person, place, and time. She has normal reflexes.  Skin: Skin is warm and dry.  Psychiatric: She has a normal mood and affect.  Nursing note and vitals reviewed.  ED Course  Procedures (including critical care time) DIAGNOSTIC STUDIES: Oxygen Saturation is 100% on RA, nl by my interpretation.    COORDINATION OF CARE: 3:03 AM: Discussed treatment plan which includes ultrasound with pt at bedside; patient verbalizes understanding and agrees with treatment plan.  Labs Review Labs Reviewed  CBC WITH DIFFERENTIAL/PLATELET - Abnormal; Notable for the following:    Hemoglobin 11.9 (*)    All other components within normal limits  I-STAT CHEM 8, ED - Abnormal; Notable for the following:    Creatinine, Ser 1.20 (*)    Glucose, Bld 102 (*)    All other components within normal limits  PROTIME-INR   I have personally reviewed and evaluated these lab results as part of my medical decision-making.   MDM   Final diagnoses:  None    Medications  Warfarin - Pharmacist Dosing Inpatient (not administered)  ketorolac (TORADOL) injection 60 mg (60 mg Intramuscular Given 12/11/14 0416)  enoxaparin (LOVENOX) injection 60 mg (60 mg Subcutaneous Given 12/11/14 0418)  warfarin (COUMADIN) tablet 5 mg (5 mg Oral Given 12/11/14 0420)    Will set up outpatient dopplers of BLE.  Will need to follow up in 2 days with her PMD for INR recheck.  Will prescribe medication for pain.  Patient verbalizes understanding and agrees to follow up  I, Estiben Mizuno-RASCH,Boruch Manuele K, personally performed the services  described in this documentation. All medical record entries made by the scribe were at my direction and in my presence.  I have reviewed the chart and discharge instructions and agree that the record reflects my personal performance and is accurate and complete. Layne Dilauro-RASCH,Ishaaq Penna K.  12/11/2014. 4:58 AM.     Joyanne Eddinger, MD 12/11/14 [phone]

## 2014-12-11 NOTE — Progress Notes (Signed)
Preliminary results by tech - Venous Duplex Lower Ext. Completed. Negative for acute deep vein thrombosis in both lower extremities. There is evidence of chronic appearing thrombus in the right right posterior tibial and peroneal veins.  Oda Cogan, BS, RDMS, RVT

## 2014-12-12 DIAGNOSIS — R600 Localized edema: Secondary | ICD-10-CM | POA: Diagnosis not present

## 2014-12-12 DIAGNOSIS — M79604 Pain in right leg: Secondary | ICD-10-CM | POA: Diagnosis not present

## 2014-12-21 DIAGNOSIS — R609 Edema, unspecified: Secondary | ICD-10-CM | POA: Diagnosis not present

## 2014-12-21 DIAGNOSIS — Z86711 Personal history of pulmonary embolism: Secondary | ICD-10-CM | POA: Diagnosis not present

## 2014-12-21 DIAGNOSIS — I10 Essential (primary) hypertension: Secondary | ICD-10-CM | POA: Diagnosis not present

## 2014-12-21 DIAGNOSIS — Z7901 Long term (current) use of anticoagulants: Secondary | ICD-10-CM | POA: Diagnosis not present

## 2015-02-16 DIAGNOSIS — Z86711 Personal history of pulmonary embolism: Secondary | ICD-10-CM | POA: Diagnosis not present

## 2015-02-16 DIAGNOSIS — Z7901 Long term (current) use of anticoagulants: Secondary | ICD-10-CM | POA: Diagnosis not present

## 2015-02-28 DIAGNOSIS — Z7901 Long term (current) use of anticoagulants: Secondary | ICD-10-CM | POA: Diagnosis not present

## 2015-02-28 DIAGNOSIS — Z86711 Personal history of pulmonary embolism: Secondary | ICD-10-CM | POA: Diagnosis not present

## 2015-03-15 DIAGNOSIS — Z7901 Long term (current) use of anticoagulants: Secondary | ICD-10-CM | POA: Diagnosis not present

## 2015-03-15 DIAGNOSIS — I2699 Other pulmonary embolism without acute cor pulmonale: Secondary | ICD-10-CM | POA: Diagnosis not present

## 2015-04-06 DIAGNOSIS — Z86711 Personal history of pulmonary embolism: Secondary | ICD-10-CM | POA: Diagnosis not present

## 2015-04-06 DIAGNOSIS — Z7901 Long term (current) use of anticoagulants: Secondary | ICD-10-CM | POA: Diagnosis not present

## 2015-04-18 DIAGNOSIS — I10 Essential (primary) hypertension: Secondary | ICD-10-CM | POA: Diagnosis not present

## 2015-04-18 DIAGNOSIS — Z7901 Long term (current) use of anticoagulants: Secondary | ICD-10-CM | POA: Diagnosis not present

## 2015-04-18 DIAGNOSIS — Z86711 Personal history of pulmonary embolism: Secondary | ICD-10-CM | POA: Diagnosis not present

## 2015-05-17 DIAGNOSIS — Z7901 Long term (current) use of anticoagulants: Secondary | ICD-10-CM | POA: Diagnosis not present

## 2015-05-17 DIAGNOSIS — I10 Essential (primary) hypertension: Secondary | ICD-10-CM | POA: Diagnosis not present

## 2015-05-17 DIAGNOSIS — I480 Paroxysmal atrial fibrillation: Secondary | ICD-10-CM | POA: Diagnosis not present

## 2015-06-04 DIAGNOSIS — Z7901 Long term (current) use of anticoagulants: Secondary | ICD-10-CM | POA: Diagnosis not present

## 2015-06-04 DIAGNOSIS — I480 Paroxysmal atrial fibrillation: Secondary | ICD-10-CM | POA: Diagnosis not present

## 2015-06-20 ENCOUNTER — Encounter (HOSPITAL_COMMUNITY): Payer: Self-pay | Admitting: Emergency Medicine

## 2015-06-20 ENCOUNTER — Emergency Department (HOSPITAL_COMMUNITY): Payer: Commercial Managed Care - HMO

## 2015-06-20 ENCOUNTER — Emergency Department (HOSPITAL_COMMUNITY)
Admission: EM | Admit: 2015-06-20 | Discharge: 2015-06-20 | Disposition: A | Discharge status: Home / Self Care | Payer: Commercial Managed Care - HMO | Attending: Emergency Medicine | Admitting: Emergency Medicine

## 2015-06-20 DIAGNOSIS — R079 Chest pain, unspecified: Secondary | ICD-10-CM | POA: Insufficient documentation

## 2015-06-20 DIAGNOSIS — Z8719 Personal history of other diseases of the digestive system: Secondary | ICD-10-CM | POA: Diagnosis not present

## 2015-06-20 DIAGNOSIS — Z86711 Personal history of pulmonary embolism: Secondary | ICD-10-CM | POA: Insufficient documentation

## 2015-06-20 DIAGNOSIS — Z79899 Other long term (current) drug therapy: Secondary | ICD-10-CM | POA: Insufficient documentation

## 2015-06-20 DIAGNOSIS — Z87891 Personal history of nicotine dependence: Secondary | ICD-10-CM | POA: Diagnosis not present

## 2015-06-20 DIAGNOSIS — Z8739 Personal history of other diseases of the musculoskeletal system and connective tissue: Secondary | ICD-10-CM | POA: Diagnosis not present

## 2015-06-20 DIAGNOSIS — Z8639 Personal history of other endocrine, nutritional and metabolic disease: Secondary | ICD-10-CM | POA: Insufficient documentation

## 2015-06-20 DIAGNOSIS — I10 Essential (primary) hypertension: Secondary | ICD-10-CM | POA: Diagnosis not present

## 2015-06-20 DIAGNOSIS — I4891 Unspecified atrial fibrillation: Secondary | ICD-10-CM | POA: Insufficient documentation

## 2015-06-20 DIAGNOSIS — Z8541 Personal history of malignant neoplasm of cervix uteri: Secondary | ICD-10-CM | POA: Diagnosis not present

## 2015-06-20 DIAGNOSIS — R109 Unspecified abdominal pain: Secondary | ICD-10-CM

## 2015-06-20 LAB — URINALYSIS, ROUTINE W REFLEX MICROSCOPIC
BILIRUBIN URINE: NEGATIVE
Glucose, UA: NEGATIVE mg/dL
HGB URINE DIPSTICK: NEGATIVE
KETONES UR: NEGATIVE mg/dL
Leukocytes, UA: NEGATIVE
Nitrite: NEGATIVE
PROTEIN: NEGATIVE mg/dL
Specific Gravity, Urine: 1.01 (ref 1.005–1.030)
pH: 7 (ref 5.0–8.0)

## 2015-06-20 LAB — COMPREHENSIVE METABOLIC PANEL
ALT: 14 U/L (ref 14–54)
AST: 21 U/L (ref 15–41)
Albumin: 3.4 g/dL — ABNORMAL LOW (ref 3.5–5.0)
Alkaline Phosphatase: 55 U/L (ref 38–126)
Anion gap: 6 (ref 5–15)
CHLORIDE: 111 mmol/L (ref 101–111)
CO2: 26 mmol/L (ref 22–32)
Calcium: 9.9 mg/dL (ref 8.9–10.3)
Creatinine, Ser: 0.83 mg/dL (ref 0.44–1.00)
GFR calc Af Amer: >60 mL/min (ref >60)
Glucose, Bld: 95 mg/dL (ref 65–99)
Potassium: 4.5 mmol/L (ref 3.5–5.1)
Sodium: 143 mmol/L (ref 135–145)
Total Bilirubin: 0.8 mg/dL (ref 0.3–1.2)
Total Protein: 7.2 g/dL (ref 6.5–8.1)

## 2015-06-20 LAB — CBC WITH DIFFERENTIAL/PLATELET
Basophils Absolute: 0 10*3/uL (ref 0.0–0.1)
Basophils Relative: 0 %
EOS PCT: 3 %
Eosinophils Absolute: 0.3 10*3/uL (ref 0.0–0.7)
HCT: 39.2 % (ref 36.0–46.0)
HEMOGLOBIN: 12.6 g/dL (ref 12.0–15.0)
Lymphocytes Relative: 56 %
Lymphs Abs: 4.9 10*3/uL — ABNORMAL HIGH (ref 0.7–4.0)
MCH: 27.4 pg (ref 26.0–34.0)
MCHC: 32.1 g/dL (ref 30.0–36.0)
MCV: 85.2 fL (ref 78.0–100.0)
MONO ABS: 0.9 10*3/uL (ref 0.1–1.0)
MONOS PCT: 10 %
Neutro Abs: 2.8 10*3/uL (ref 1.7–7.7)
Neutrophils Relative %: 31 %
PLATELETS: 151 10*3/uL (ref 150–400)
RBC: 4.6 MIL/uL (ref 3.87–5.11)
RDW: 16.6 % — ABNORMAL HIGH (ref 11.5–15.5)
WBC: 8.9 10*3/uL (ref 4.0–10.5)

## 2015-06-20 LAB — PATHOLOGIST SMEAR REVIEW: PATH REVIEW: BORDERLINE

## 2015-06-20 LAB — I-STAT TROPONIN, ED: Troponin i, poc: 0 ng/mL (ref 0.00–0.08)

## 2015-06-20 LAB — PROTIME-INR
INR: 2.36 — ABNORMAL HIGH (ref 0.00–1.49)
PROTHROMBIN TIME: 25.5 s — AB (ref 11.6–15.2)

## 2015-06-20 MED ORDER — ACETAMINOPHEN 325 MG PO TABS
650.0000 mg | ORAL_TABLET | Freq: Once | ORAL | Status: AC
  Administered 2015-06-20: 650 mg via ORAL
  Filled 2015-06-20: qty 2

## 2015-06-20 MED ORDER — IOPAMIDOL (ISOVUE-370) INJECTION 76%
INTRAVENOUS | Status: AC
  Administered 2015-06-20: 80 mL
  Filled 2015-06-20: qty 100

## 2015-06-20 NOTE — ED Provider Notes (Signed)
CSN: [id number]     Arrival date & time 06/20/15  0750 History   First MD Initiated Contact with Patient 06/20/15 1054     Chief Complaint  Patient presents with  . Flank Pain     (Consider location/radiation/quality/duration/timing/severity/associated sxs/prior Treatment) HPI   Sara Butler is a 82 y.o. female who presents for evaluation of right lateral chest pain and right lateral abdominal pain, present for 2 days, and reminds her of the pain that she had when she had a coronary embolus. She remains on warfarin, and her last INR was 2.2, 3 weeks ago. She denies anterior chest pain, cough, shortness of breath, weakness or dizziness. She is not having unilateral leg swelling or leg pain. She did not take her medications yet this morning. She came here by private vehicle for evaluation. There are no other known modifying factors.   Past Medical History  Diagnosis Date  . Hypertension   . Cervical cancer ([place])   . Atrial fibrillation ([place])   . Bleeding from anus   . Diverticulitis of colon   . Hyperlipidemia   . GERD (gastroesophageal reflux disease)   . Pulmonary embolism ([place])   . Gout    Past Surgical History  Procedure Laterality Date  . Abdominal hysterectomy    . Back surgery     Family History  Problem Relation Age of Onset  . Diabetes Brother   . Heart failure Mother   . Cancer Neg Hx    Social History  Substance Use Topics  . Smoking status: Former Smoker -- 0.10 packs/day for 58 years    Types: Cigarettes  . Smokeless tobacco: Never Used  . Alcohol Use: No   OB History    No data available     Review of Systems  All other systems reviewed and are negative.     Allergies  Review of patient's allergies indicates no known allergies.  Home Medications   Prior to Admission medications   Medication Sig Start Date End Date Taking? Authorizing Provider  diclofenac sodium (VOLTAREN) 1 % GEL Apply 4 g topically 4 (four) times daily. 12/11/14  Yes April  Palumbo, MD  metoprolol succinate (TOPROL-XL) 50 MG 24 hr tablet Take 50 mg by mouth daily. Take with or immediately following a meal.   Yes Historical Provider, MD  warfarin (COUMADIN) 5 MG tablet Take 5-7.5 mg by mouth daily. On Monday, Wednesday, and Friday take 1 tab (5mg ) All other days take 1 1/2 tablet (7.5mg )   Yes Historical Provider, MD   BP 180/88 mmHg  Pulse 57  Temp(Src) 97.8 F (36.6 C) (Oral)  Resp 19  Ht 5\' 5"  (1.651 m)  Wt 132 lb 8 oz (60.102 kg)  BMI 22.05 kg/m2  SpO2 99% Physical Exam  Constitutional: She is oriented to person, place, and time. She appears well-developed and well-nourished.  HENT:  Head: Normocephalic and atraumatic.  Right Ear: External ear normal.  Left Ear: External ear normal.  Eyes: Conjunctivae and EOM are normal. Pupils are equal, round, and reactive to light.  Neck: Normal range of motion and phonation normal. Neck supple.  Cardiovascular: Normal rate, regular rhythm and normal heart sounds.   Pulmonary/Chest: Effort normal and breath sounds normal. No respiratory distress. She has no wheezes. She exhibits no tenderness and no bony tenderness.  No rhonchi or rales. No increased work of breathing.  Abdominal: Soft. There is no tenderness.  Musculoskeletal: Normal range of motion. She exhibits no edema or tenderness.  No  range of motion, arms and legs bilaterally without limitation.  Neurological: She is alert and oriented to person, place, and time. No cranial nerve deficit or sensory deficit. She exhibits normal muscle tone. Coordination normal.  Skin: Skin is warm, dry and intact.  Psychiatric: She has a normal mood and affect. Her behavior is normal. Judgment and thought content normal.  Nursing note and vitals reviewed.   ED Course  Procedures (including critical care time)  Initial clinical impression- nonspecific chest and abdominal pain, likely musculoskeletal. Differential diagnosis includes recurrent PE, and a tract infection,  occult injury, and nonspecific intestinal disorder. Will evaluate with imaging, labs, and reassess patient.  Medications  acetaminophen (TYLENOL) tablet 650 mg (650 mg Oral Given 06/20/15 1153)  iopamidol (ISOVUE-370) 76 % injection (80 mLs  Contrast Given 06/20/15 1231)    Patient Vitals for the past 24 hrs:  BP Temp Temp src Pulse Resp SpO2 Height Weight  06/20/15 1435 180/88 mmHg - - (!) 57 19 99 % - -  06/20/15 1345 180/86 mmHg - - (!) 56 21 99 % - -  06/20/15 1330 187/93 mmHg - - (!) 58 20 99 % - -  06/20/15 1300 (!) 170/109 mmHg - - 69 20 99 % - -  06/20/15 1215 181/93 mmHg - - (!) 57 15 99 % - -  06/20/15 1200 (!) 185/106 mmHg - - (!) 56 - 100 % - -  06/20/15 1130 (!) 207/88 mmHg - - 61 - 99 % - -  06/20/15 1115 187/94 mmHg - - 67 - 98 % - -  06/20/15 1102 (!) 177/109 mmHg - - (!) 59 18 100 % - -  06/20/15 0757 (!) 190/104 mmHg 97.8 F (36.6 C) Oral 64 18 99 % 5\' 5"  (1.651 m) 132 lb 8 oz (60.102 kg)    At discharge- Reevaluation with update and discussion. After initial assessment and treatment, an updated evaluation reveals she remains comfortable, no further complaints. Findings discussed with the patient and her family members, all questions were answered. [place] Review Labs Reviewed  CBC WITH DIFFERENTIAL/PLATELET - Abnormal; Notable for the following:    RDW 16.6 (*)    Lymphs Abs 4.9 (*)    All other components within normal limits  COMPREHENSIVE METABOLIC PANEL - Abnormal; Notable for the following:    BUN <5 (*)    Albumin 3.4 (*)    All other components within normal limits  PROTIME-INR - Abnormal; Notable for the following:    Prothrombin Time 25.5 (*)    INR 2.36 (*)    All other components within normal limits  URINE CULTURE  URINALYSIS, ROUTINE W REFLEX MICROSCOPIC (NOT AT [facility])  PATHOLOGIST SMEAR REVIEW  I-STAT TROPOININ, ED    Imaging Review Ct Angio Chest Pe W/cm &/or Wo Cm  06/20/2015  CLINICAL DATA:  Two days of right flank pain,  history of PE. EXAM: CT ANGIOGRAPHY CHEST WITH CONTRAST TECHNIQUE: Multidetector CT imaging of the chest was performed using the standard protocol during bolus administration of intravenous contrast. Multiplanar CT image reconstructions and MIPs were obtained to evaluate the vascular anatomy. CONTRAST:  80 cc Isovue 370. COMPARISON:  07/04/2014. FINDINGS: Mediastinum/Nodes: Negative for pulmonary embolus. No pathologically enlarged mediastinal lymph nodes. Right hilar lymph node measures 11 mm. Left hilar lymphoid tissue. No axillary adenopathy. Atherosclerotic calcification of the arterial vasculature, including three-vessel involvement of the coronary arteries. Heart size normal. No pericardial effusion. Lungs/Pleura: Biapical pleural parenchymal scarring. A few scattered  pulmonary nodules measure 3 mm or less in size, unchanged and therefore benign. Subpleural scarring in the right lower lobe, posteriorly. Mosaic pulmonary parenchymal attenuation, nonspecific. No pleural fluid. Airway is unremarkable. Upper abdomen: Visualized portions of the liver, adrenal glands, kidneys, spleen were, pancreas, stomach and bowel are grossly unremarkable. Musculoskeletal: No worrisome lytic or sclerotic lesions. Degenerative changes are seen the spine. Review of the MIP images confirms the above findings. IMPRESSION: 1. Negative for pulmonary embolus. 2. Three-vessel coronary artery calcification. Electronically Signed   By: Lorin Picket M.D.   On: 06/20/2015 12:57   I have personally reviewed and evaluated these images and lab results as part of my medical decision-making.   EKG Interpretation   Date/Time:  Wednesday June 20 2015 12:05:16 EDT Ventricular Rate:  60 PR Interval:  144 QRS Duration: 90 QT Interval:  390 QTC Calculation: 390 R Axis:   150 Text Interpretation:  Sinus or ectopic atrial rhythm Probable RVH w/  secondary repol abnormality Anterolateral infarct, age indeterminate  Abnormal T, consider  ischemia, lateral leads Baseline wander in lead(s) V2  Since last tracing T wave abnormality is more pronounced Confirmed by  Eulis Foster  MD, Vira Agar [id number]) on 06/20/2015 12:19:12 PM Also confirmed by  Eulis Foster  MD, Octavion Mollenkopf [phone]), editor WALKER, CCT, [place] ([id number])  on  06/20/2015 1:11:55 PM      MDM   Final diagnoses:  Flank pain    Nonspecific right flank pain, suspect musculoskeletal process. Doubt UTI, PE, intra-abdominal abnormality.   Nursing Notes Reviewed/ Care Coordinated Applicable Imaging Reviewed Interpretation of Laboratory Data incorporated into ED treatment  The patient appears reasonably screened and/or stabilized for discharge and I doubt any other medical condition or other [facility] requiring further screening, evaluation, or treatment in the ED at this time prior to discharge.  Plan: Home Medications- Tylenol ; Home Treatments- Heat; return here if the recommended treatment, does not improve the symptoms; Recommended follow up- PCP prn     Daleen Bo, MD 06/20/15 1714

## 2015-06-20 NOTE — Discharge Instructions (Signed)
Use heat on the sore area 3 or 4 times a day. Take Tylenol every 4 hours if needed for pain   Flank Pain Flank pain is pain in your side. The flank is the area of your side between your upper belly (abdomen) and your back. Pain in this area can be caused by many different things. [place] care and treatment will depend on the cause of your pain.  Rest as told by your doctor.  Drink enough fluids to keep your pee (urine) clear or pale yellow.  Only take medicine as told by your doctor.  Tell your doctor about any changes in your pain.  Follow up with your doctor. GET HELP RIGHT AWAY IF:   Your pain does not get better with medicine.   You have new symptoms or your symptoms get worse.  Your pain gets worse.   You have belly (abdominal) pain.   You are short of breath.   You always feel sick to your stomach (nauseous).   You keep throwing up (vomiting).   You have puffiness (swelling) in your belly.   You feel light-headed or you pass out (faint).   You have blood in your pee.  You have a fever or lasting symptoms for more than 2-3 days.  You have a fever and your symptoms suddenly get worse. MAKE SURE YOU:   Understand these instructions.  Will watch your condition.  Will get help right away if you are not doing well or get worse.   This information is not intended to replace advice given to you by your health care provider. Make sure you discuss any questions you have with your health care provider.   Document Released: 11/20/2007 Document Revised: 03/03/2014 Document Reviewed: 09/25/2011 Elsevier Interactive Patient Education [employer].

## 2015-06-20 NOTE — ED Notes (Signed)
Patient transported to CT 

## 2015-06-20 NOTE — ED Notes (Signed)
PT did not take BP meds this AM

## 2015-06-20 NOTE — ED Notes (Signed)
Pt needs PT redraw

## 2015-06-20 NOTE — ED Notes (Signed)
Pt sts right flank pain x 2 days; pt sts hx of blood clots and is on coumadin but also sts urinary frequency

## 2015-06-21 LAB — URINE CULTURE

## 2015-06-25 DIAGNOSIS — Z7901 Long term (current) use of anticoagulants: Secondary | ICD-10-CM | POA: Diagnosis not present

## 2015-06-25 DIAGNOSIS — Z86711 Personal history of pulmonary embolism: Secondary | ICD-10-CM | POA: Diagnosis not present

## 2015-07-05 DIAGNOSIS — Z86711 Personal history of pulmonary embolism: Secondary | ICD-10-CM | POA: Diagnosis not present

## 2015-07-05 DIAGNOSIS — R600 Localized edema: Secondary | ICD-10-CM | POA: Diagnosis not present

## 2015-07-05 DIAGNOSIS — Z7901 Long term (current) use of anticoagulants: Secondary | ICD-10-CM | POA: Diagnosis not present

## 2015-07-05 DIAGNOSIS — K219 Gastro-esophageal reflux disease without esophagitis: Secondary | ICD-10-CM | POA: Diagnosis not present

## 2015-07-05 DIAGNOSIS — I10 Essential (primary) hypertension: Secondary | ICD-10-CM | POA: Diagnosis not present

## 2015-07-13 DIAGNOSIS — Z86711 Personal history of pulmonary embolism: Secondary | ICD-10-CM | POA: Diagnosis not present

## 2015-07-13 DIAGNOSIS — Z7901 Long term (current) use of anticoagulants: Secondary | ICD-10-CM | POA: Diagnosis not present

## 2015-07-19 DIAGNOSIS — Z7901 Long term (current) use of anticoagulants: Secondary | ICD-10-CM | POA: Diagnosis not present

## 2015-07-19 DIAGNOSIS — I480 Paroxysmal atrial fibrillation: Secondary | ICD-10-CM | POA: Diagnosis not present

## 2015-07-31 DIAGNOSIS — M2550 Pain in unspecified joint: Secondary | ICD-10-CM | POA: Diagnosis not present

## 2015-07-31 DIAGNOSIS — I10 Essential (primary) hypertension: Secondary | ICD-10-CM | POA: Diagnosis not present

## 2015-07-31 DIAGNOSIS — R350 Frequency of micturition: Secondary | ICD-10-CM | POA: Diagnosis not present

## 2015-07-31 DIAGNOSIS — Z7901 Long term (current) use of anticoagulants: Secondary | ICD-10-CM | POA: Diagnosis not present

## 2015-08-07 DIAGNOSIS — Z86711 Personal history of pulmonary embolism: Secondary | ICD-10-CM | POA: Diagnosis not present

## 2015-08-07 DIAGNOSIS — Z7901 Long term (current) use of anticoagulants: Secondary | ICD-10-CM | POA: Diagnosis not present

## 2015-08-22 DIAGNOSIS — Z7901 Long term (current) use of anticoagulants: Secondary | ICD-10-CM | POA: Diagnosis not present

## 2015-08-22 DIAGNOSIS — L299 Pruritus, unspecified: Secondary | ICD-10-CM | POA: Diagnosis not present

## 2015-08-22 DIAGNOSIS — R21 Rash and other nonspecific skin eruption: Secondary | ICD-10-CM | POA: Diagnosis not present

## 2015-08-27 DIAGNOSIS — Z7901 Long term (current) use of anticoagulants: Secondary | ICD-10-CM | POA: Diagnosis not present

## 2015-08-27 DIAGNOSIS — Z86711 Personal history of pulmonary embolism: Secondary | ICD-10-CM | POA: Diagnosis not present

## 2015-09-04 DIAGNOSIS — Z86711 Personal history of pulmonary embolism: Secondary | ICD-10-CM | POA: Diagnosis not present

## 2015-09-04 DIAGNOSIS — Z7901 Long term (current) use of anticoagulants: Secondary | ICD-10-CM | POA: Diagnosis not present

## 2015-09-05 DIAGNOSIS — L309 Dermatitis, unspecified: Secondary | ICD-10-CM | POA: Diagnosis not present

## 2015-09-18 DIAGNOSIS — Z86711 Personal history of pulmonary embolism: Secondary | ICD-10-CM | POA: Diagnosis not present

## 2015-09-18 DIAGNOSIS — Z7901 Long term (current) use of anticoagulants: Secondary | ICD-10-CM | POA: Diagnosis not present

## 2015-10-01 DIAGNOSIS — Z7901 Long term (current) use of anticoagulants: Secondary | ICD-10-CM | POA: Diagnosis not present

## 2015-10-01 DIAGNOSIS — Z86711 Personal history of pulmonary embolism: Secondary | ICD-10-CM | POA: Diagnosis not present

## 2015-10-09 DIAGNOSIS — Z86711 Personal history of pulmonary embolism: Secondary | ICD-10-CM | POA: Diagnosis not present

## 2015-10-09 DIAGNOSIS — Z7901 Long term (current) use of anticoagulants: Secondary | ICD-10-CM | POA: Diagnosis not present

## 2015-10-24 DIAGNOSIS — Z86711 Personal history of pulmonary embolism: Secondary | ICD-10-CM | POA: Diagnosis not present

## 2015-10-24 DIAGNOSIS — Z7901 Long term (current) use of anticoagulants: Secondary | ICD-10-CM | POA: Diagnosis not present

## 2015-11-01 DIAGNOSIS — Z7901 Long term (current) use of anticoagulants: Secondary | ICD-10-CM | POA: Diagnosis not present

## 2015-11-01 DIAGNOSIS — Z86711 Personal history of pulmonary embolism: Secondary | ICD-10-CM | POA: Diagnosis not present

## 2015-11-12 DIAGNOSIS — Z86711 Personal history of pulmonary embolism: Secondary | ICD-10-CM | POA: Diagnosis not present

## 2015-11-12 DIAGNOSIS — Z7901 Long term (current) use of anticoagulants: Secondary | ICD-10-CM | POA: Diagnosis not present

## 2015-11-26 DIAGNOSIS — Z7901 Long term (current) use of anticoagulants: Secondary | ICD-10-CM | POA: Diagnosis not present

## 2015-11-26 DIAGNOSIS — I10 Essential (primary) hypertension: Secondary | ICD-10-CM | POA: Diagnosis not present

## 2015-11-26 DIAGNOSIS — Z86711 Personal history of pulmonary embolism: Secondary | ICD-10-CM | POA: Diagnosis not present

## 2015-11-26 DIAGNOSIS — N3281 Overactive bladder: Secondary | ICD-10-CM | POA: Diagnosis not present

## 2015-11-26 DIAGNOSIS — R350 Frequency of micturition: Secondary | ICD-10-CM | POA: Diagnosis not present

## 2015-12-10 DIAGNOSIS — Z7901 Long term (current) use of anticoagulants: Secondary | ICD-10-CM | POA: Diagnosis not present

## 2015-12-10 DIAGNOSIS — Z86711 Personal history of pulmonary embolism: Secondary | ICD-10-CM | POA: Diagnosis not present

## 2016-01-01 ENCOUNTER — Emergency Department (HOSPITAL_COMMUNITY)
Admission: EM | Admit: 2016-01-01 | Discharge: 2016-01-01 | Disposition: A | Discharge status: Home / Self Care | Payer: Commercial Managed Care - HMO | Attending: Emergency Medicine | Admitting: Emergency Medicine

## 2016-01-01 ENCOUNTER — Encounter (HOSPITAL_COMMUNITY): Payer: Self-pay | Admitting: Emergency Medicine

## 2016-01-01 ENCOUNTER — Emergency Department (HOSPITAL_COMMUNITY): Payer: Commercial Managed Care - HMO

## 2016-01-01 DIAGNOSIS — R1030 Lower abdominal pain, unspecified: Secondary | ICD-10-CM | POA: Diagnosis present

## 2016-01-01 DIAGNOSIS — R590 Localized enlarged lymph nodes: Secondary | ICD-10-CM | POA: Insufficient documentation

## 2016-01-01 DIAGNOSIS — Z7901 Long term (current) use of anticoagulants: Secondary | ICD-10-CM | POA: Insufficient documentation

## 2016-01-01 DIAGNOSIS — Z79899 Other long term (current) drug therapy: Secondary | ICD-10-CM | POA: Insufficient documentation

## 2016-01-01 DIAGNOSIS — Z87891 Personal history of nicotine dependence: Secondary | ICD-10-CM | POA: Diagnosis not present

## 2016-01-01 DIAGNOSIS — Z8541 Personal history of malignant neoplasm of cervix uteri: Secondary | ICD-10-CM | POA: Diagnosis not present

## 2016-01-01 DIAGNOSIS — I10 Essential (primary) hypertension: Secondary | ICD-10-CM | POA: Insufficient documentation

## 2016-01-01 DIAGNOSIS — K5730 Diverticulosis of large intestine without perforation or abscess without bleeding: Secondary | ICD-10-CM | POA: Diagnosis not present

## 2016-01-01 LAB — URINALYSIS, ROUTINE W REFLEX MICROSCOPIC
BILIRUBIN URINE: NEGATIVE
GLUCOSE, UA: NEGATIVE mg/dL
KETONES UR: NEGATIVE mg/dL
Leukocytes, UA: NEGATIVE
NITRITE: NEGATIVE
PH: 6 (ref 5.0–8.0)
Protein, ur: NEGATIVE mg/dL
SPECIFIC GRAVITY, URINE: 1.015 (ref 1.005–1.030)

## 2016-01-01 LAB — URINE MICROSCOPIC-ADD ON: WBC UA: NONE SEEN WBC/hpf (ref 0–5)

## 2016-01-01 LAB — CBC
HEMATOCRIT: 35.7 % — AB (ref 36.0–46.0)
HEMOGLOBIN: 11.3 g/dL — AB (ref 12.0–15.0)
MCH: 25.5 pg — AB (ref 26.0–34.0)
MCHC: 31.7 g/dL (ref 30.0–36.0)
MCV: 80.4 fL (ref 78.0–100.0)
Platelets: 118 10*3/uL — ABNORMAL LOW (ref 150–400)
RBC: 4.44 MIL/uL (ref 3.87–5.11)
RDW: 16.5 % — AB (ref 11.5–15.5)
WBC: 10 10*3/uL (ref 4.0–10.5)

## 2016-01-01 LAB — COMPREHENSIVE METABOLIC PANEL
ALBUMIN: 3.3 g/dL — AB (ref 3.5–5.0)
ALT: 23 U/L (ref 14–54)
ANION GAP: 6 (ref 5–15)
AST: 25 U/L (ref 15–41)
Alkaline Phosphatase: 60 U/L (ref 38–126)
BILIRUBIN TOTAL: 0.7 mg/dL (ref 0.3–1.2)
BUN: 16 mg/dL (ref 6–20)
CHLORIDE: 109 mmol/L (ref 101–111)
CO2: 23 mmol/L (ref 22–32)
Calcium: 9.8 mg/dL (ref 8.9–10.3)
Creatinine, Ser: 0.95 mg/dL (ref 0.44–1.00)
GFR calc Af Amer: >60 mL/min (ref >60)
GFR calc non Af Amer: 53 mL/min — ABNORMAL LOW (ref >60)
GLUCOSE: 88 mg/dL (ref 65–99)
POTASSIUM: 3.7 mmol/L (ref 3.5–5.1)
SODIUM: 138 mmol/L (ref 135–145)
TOTAL PROTEIN: 7.4 g/dL (ref 6.5–8.1)

## 2016-01-01 LAB — LIPASE, BLOOD: Lipase: 29 U/L (ref 11–51)

## 2016-01-01 MED ORDER — IOPAMIDOL (ISOVUE-300) INJECTION 61%
INTRAVENOUS | Status: AC
  Administered 2016-01-01: 100 mL
  Filled 2016-01-01: qty 100

## 2016-01-01 MED ORDER — IOPAMIDOL (ISOVUE-300) INJECTION 61%
INTRAVENOUS | Status: AC
  Filled 2016-01-01: qty 100

## 2016-01-01 NOTE — ED Provider Notes (Signed)
[facility] DEPT Provider Note   CSN: [id number] Arrival date & time: 01/01/16  1415     History   Chief Complaint Chief Complaint  Patient presents with  . Abdominal Pain    HPI Sara Butler is a 82 y.o. female.  The history is provided by the patient.  Abdominal Pain   This is a new problem. The current episode started 2 days ago. The problem occurs constantly. The problem has not changed since onset.The pain is located in the suprapubic region. The quality of the pain is pressure-like. The pain is moderate. Associated symptoms include frequency (baseline). Pertinent negatives include anorexia, fever, diarrhea, hematochezia, melena, vomiting, dysuria, hematuria and arthralgias. Nothing aggravates the symptoms. Nothing relieves the symptoms. Past medical history comments: diverticulitis.    Past Medical History:  Diagnosis Date  . Atrial fibrillation ([place])   . Bleeding from anus   . Cervical cancer ([place])   . Diverticulitis of colon   . GERD (gastroesophageal reflux disease)   . Gout   . Hyperlipidemia   . Hypertension   . Pulmonary embolism [facility])     Patient Active Problem List   Diagnosis Date Noted  . Chest pain 07/04/2014  . Pseudogout of right wrist 07/04/2014  . Acute gout   . Pulmonary emboli ([place]) 04/26/2011  . Hypertension   . Atrial fibrillation ([place])   . Hyperlipidemia   . GERD (gastroesophageal reflux disease)     Past Surgical History:  Procedure Laterality Date  . ABDOMINAL HYSTERECTOMY    . BACK SURGERY      OB History    No data available       Home Medications    Prior to Admission medications   Medication Sig Start Date End Date Taking? Authorizing Provider  acetaminophen (TYLENOL) 500 MG tablet Take 500 mg by mouth every 6 (six) hours as needed for headache.   Yes Historical Provider, MD  amLODipine (NORVASC) 2.5 MG tablet Take 2.5 mg by mouth daily.   Yes Historical Provider, MD  diclofenac sodium (VOLTAREN) 1 % GEL Apply 2 g  topically daily as needed. For pain on legs   Yes Historical Provider, MD  warfarin (COUMADIN) 5 MG tablet Take 5-7.5 mg by mouth daily. On Monday, Wednesday, and Friday take 1 tab (5mg ) All other days take 1 1/2 tablet (7.5mg )   Yes Historical Provider, MD  diclofenac sodium (VOLTAREN) 1 % GEL Apply 4 g topically 4 (four) times daily. Patient not taking: Reported on 01/01/2016 12/11/14   April Palumbo, MD  metoprolol succinate (TOPROL-XL) 50 MG 24 hr tablet Take 50 mg by mouth daily. Take with or immediately following a meal.    Historical Provider, MD    Family History Family History  Problem Relation Age of Onset  . Heart failure Mother   . Diabetes Brother   . Cancer Neg Hx     Social History Social History  Substance Use Topics  . Smoking status: Former Smoker    Packs/day: 0.10    Years: 58.00    Types: Cigarettes  . Smokeless tobacco: Never Used  . Alcohol use No     Allergies   Patient has no known allergies.   Review of Systems Review of Systems  Constitutional: Negative for chills and fever.  HENT: Negative for ear pain and sore throat.   Eyes: Negative for pain and visual disturbance.  Respiratory: Negative for cough and shortness of breath.   Cardiovascular: Negative for chest pain and palpitations.  Gastrointestinal:  Positive for abdominal pain. Negative for anorexia, diarrhea, hematochezia, melena and vomiting.  Genitourinary: Positive for frequency (baseline). Negative for dysuria and hematuria.  Musculoskeletal: Negative for arthralgias and back pain.  Skin: Negative for color change and rash.  Neurological: Negative for seizures and syncope.  All other systems reviewed and are negative.    Physical Exam Updated Vital Signs BP 165/99 (BP Location: Right Arm)   Pulse 67   Temp 98.2 F (36.8 C) (Oral)   Resp 16   SpO2 100%   Physical Exam  Constitutional: She is oriented to person, place, and time. She appears well-developed and well-nourished. No  distress.  HENT:  Head: Normocephalic and atraumatic.  Nose: Nose normal.  Eyes: Conjunctivae and EOM are normal. Pupils are equal, round, and reactive to light. Right eye exhibits no discharge. Left eye exhibits no discharge. No scleral icterus.  Neck: Normal range of motion. Neck supple.  Cardiovascular: Normal rate and regular rhythm.  Exam reveals no gallop and no friction rub.   No murmur heard. Pulmonary/Chest: Effort normal and breath sounds normal. No stridor. No respiratory distress. She has no rales.  Abdominal: Soft. She exhibits no distension. There is tenderness in the right lower quadrant and suprapubic area. There is no rigidity, no rebound, no guarding and no CVA tenderness.  Musculoskeletal: She exhibits no edema or tenderness.  Neurological: She is alert and oriented to person, place, and time.  Skin: Skin is warm and dry. No rash noted. She is not diaphoretic. No erythema.  Psychiatric: She has a normal mood and affect.  Vitals reviewed.    ED Treatments / Results  Labs (all labs ordered are listed, but only abnormal results are displayed) Labs Reviewed  COMPREHENSIVE METABOLIC PANEL - Abnormal; Notable for the following:       Result Value   Albumin 3.3 (*)    GFR calc non Af Amer 53 (*)    All other components within normal limits  CBC - Abnormal; Notable for the following:    Hemoglobin 11.3 (*)    HCT 35.7 (*)    MCH 25.5 (*)    RDW 16.5 (*)    Platelets 118 (*)    All other components within normal limits  URINALYSIS, ROUTINE W REFLEX MICROSCOPIC (NOT AT [facility]) - Abnormal; Notable for the following:    Hgb urine dipstick SMALL (*)    All other components within normal limits  URINE MICROSCOPIC-ADD ON - Abnormal; Notable for the following:    Squamous Epithelial / LPF 0-5 (*)    Bacteria, UA FEW (*)    All other components within normal limits  LIPASE, BLOOD    EKG  EKG Interpretation None       Radiology Ct Abdomen Pelvis W Contrast  Result  Date: 01/01/2016 CLINICAL DATA:  Lower abdominal pain for 2 days EXAM: CT ABDOMEN AND PELVIS WITH CONTRAST TECHNIQUE: Multidetector CT imaging of the abdomen and pelvis was performed using the standard protocol following bolus administration of intravenous contrast. CONTRAST:  100 mL ISOVUE-300 COMPARISON:  02/07/2013, 06/20/2015 FINDINGS: Lower chest: No acute abnormality. Hepatobiliary: The liver again demonstrates some peripheral hyperenhancement similar to that seen on the prior exam. Scattered hepatic cysts are noted slightly increased in number when compared with the prior study. The gallbladder is within normal limits. Pancreas: Unremarkable. No pancreatic ductal dilatation or surrounding inflammatory changes. Spleen: Mildly prominent although no focal mass lesion is seen. Adrenals/Urinary Tract: Adrenal glands are unremarkable. Kidneys are normal, without renal calculi, focal  lesion, or hydronephrosis. Bladder is unremarkable. Stomach/Bowel: Scattered diverticular changes noted without definitive diverticulitis. No obstructive changes are seen. The appendix is within normal limits. Vascular/Lymphatic: Aortic atherosclerosis. Prominent inguinal lymph nodes are again identified bilaterally but enlarged when compared with the prior study. This measure approximately 19 mm in short axis in the left inguinal region an approximately 11 mm in the right inguinal region. Scattered smaller inguinal lymph nodes are seen. No definitive iliac chain nodes are noted. No periaortic nodes are seen. A 12-13 mm short axis gastrohepatic low lymph node is noted. This is new from the prior exam Reproductive: Status post hysterectomy. No adnexal masses. Other: No abdominal wall hernia or abnormality. No abdominopelvic ascites. Musculoskeletal: Degenerative changes of the lumbar spine are seen. Anterolisthesis of L4 on L5 is noted stable from the prior exam. IMPRESSION: Irregular enhancement within the liver stable from 2014 likely  related to some differential perfusion. Scattered hepatic cysts are again noted. Diverticulosis without diverticulitis. Prominent bilateral inguinal as well as gastrohepatic lymphadenopathy. These are new from the prior exam of 2014 in the gastrohepatic changes appear to be new from the recent exam from 06/20/2015. Sampling of these inguinal lymph nodes may be helpful as clinically indicated. If no sampling is performed short-term follow-up in 3-6 months may be helpful to assess for resolution/persistence. Electronically Signed   By: Inez Catalina M.D.   On: 01/01/2016 18:27    Procedures Procedures (including critical care time)  Medications Ordered in ED Medications  iopamidol (ISOVUE-300) 61 % injection (not administered)  iopamidol (ISOVUE-300) 61 % injection (100 mLs  Contrast Given 01/01/16 1802)     Initial Impression / Assessment and Plan / ED Course  I have reviewed the triage vital signs and the nursing notes.  Pertinent labs & imaging results that were available during my care of the patient were reviewed by me and considered in my medical decision making (see chart for details).  Clinical Course     No evidence of urinary tract infection. CT scan without evidence of diverticulitis or other acute infectious intra-abdominal etiology. CT did note inguinal lymphadenopathy of undetermined source. Labs grossly reassuring. Patient made aware of the incidental findings and instructed to follow up with primary care for surveillance and further workup. She is well-hydrated, well-appearing, nontoxic. Appropriate for discharge with close PCP follow-up  Final Clinical Impressions(s) / ED Diagnoses   Final diagnoses:  Inguinal lymphadenopathy   Disposition: Discharge  Condition: Good  I have discussed the results, Dx and Tx plan with the patient who expressed understanding and agree(s) with the plan. Discharge instructions discussed at great length. The patient was given strict return  precautions who verbalized understanding of the instructions. No further questions at time of discharge.    Current Discharge Medication List      Follow Up: [address], [place] [address] [phone]  Schedule an appointment as soon as possible for a visit  in 3-5 days, If symptoms do not improve or  worsen      Fatima Blank, MD 01/01/16 1935

## 2016-01-01 NOTE — ED Notes (Signed)
Pt reports low abd pain that radiates around to her back x 2 days.  States she normally has urinary frequency, but since she arrived in the ED, she has only been to the BR once and was not able to provide urine specimen.  Pt denies any dysuria or hematuria.  Pt reports hx of diverticulosis.  Pt is A&Ox 4.

## 2016-01-01 NOTE — Discharge Instructions (Addendum)
For your workup be required advanced imaging which noted the following incidental findings. Please follow-up with your primary care provider for continued surveillance and management as needed. 1.  Prominent bilateral inguinal as well as gastrohepatic  lymphadenopathy. These are new from the prior exam of 2014 in the  gastrohepatic changes appear to be new from the recent exam from  06/20/2015. Sampling of these inguinal lymph nodes may be helpful as  clinically indicated. If no sampling is performed short-term  follow-up in 3-6 months may be helpful to assess for  resolution/persistence

## 2016-01-01 NOTE — ED Triage Notes (Signed)
Pt states "I've got a pain at the bottom of my stomach that goes around to my back". Endorses pain x2 days. Pt states "i have to pee every two minutes". Pt also states she gets diarrhea all the time.

## 2016-01-01 NOTE — ED Notes (Signed)
Patient transported to CT 

## 2016-01-07 DIAGNOSIS — Z7901 Long term (current) use of anticoagulants: Secondary | ICD-10-CM | POA: Diagnosis not present

## 2016-01-07 DIAGNOSIS — Z86711 Personal history of pulmonary embolism: Secondary | ICD-10-CM | POA: Diagnosis not present

## 2016-02-05 DIAGNOSIS — Z7901 Long term (current) use of anticoagulants: Secondary | ICD-10-CM | POA: Diagnosis not present

## 2016-02-05 DIAGNOSIS — Z86711 Personal history of pulmonary embolism: Secondary | ICD-10-CM | POA: Diagnosis not present

## 2016-03-04 DIAGNOSIS — Z7901 Long term (current) use of anticoagulants: Secondary | ICD-10-CM | POA: Diagnosis not present

## 2016-03-04 DIAGNOSIS — Z86711 Personal history of pulmonary embolism: Secondary | ICD-10-CM | POA: Diagnosis not present

## 2016-03-25 DIAGNOSIS — Z7901 Long term (current) use of anticoagulants: Secondary | ICD-10-CM | POA: Diagnosis not present

## 2016-03-25 DIAGNOSIS — Z86711 Personal history of pulmonary embolism: Secondary | ICD-10-CM | POA: Diagnosis not present

## 2016-04-08 DIAGNOSIS — Z86711 Personal history of pulmonary embolism: Secondary | ICD-10-CM | POA: Diagnosis not present

## 2016-04-08 DIAGNOSIS — Z7901 Long term (current) use of anticoagulants: Secondary | ICD-10-CM | POA: Diagnosis not present

## 2016-05-01 DIAGNOSIS — R413 Other amnesia: Secondary | ICD-10-CM | POA: Diagnosis not present

## 2016-05-01 DIAGNOSIS — Z7901 Long term (current) use of anticoagulants: Secondary | ICD-10-CM | POA: Diagnosis not present

## 2016-05-01 DIAGNOSIS — N3281 Overactive bladder: Secondary | ICD-10-CM | POA: Diagnosis not present

## 2016-05-15 DIAGNOSIS — Z7901 Long term (current) use of anticoagulants: Secondary | ICD-10-CM | POA: Diagnosis not present

## 2016-05-15 DIAGNOSIS — Z86711 Personal history of pulmonary embolism: Secondary | ICD-10-CM | POA: Diagnosis not present

## 2016-05-22 DIAGNOSIS — Z86711 Personal history of pulmonary embolism: Secondary | ICD-10-CM | POA: Diagnosis not present

## 2016-05-22 DIAGNOSIS — Z7901 Long term (current) use of anticoagulants: Secondary | ICD-10-CM | POA: Diagnosis not present

## 2016-06-02 ENCOUNTER — Encounter: Payer: Self-pay | Admitting: Hematology

## 2016-06-02 ENCOUNTER — Telehealth: Payer: Self-pay | Admitting: Hematology

## 2016-06-02 NOTE — Telephone Encounter (Signed)
Received a call from Tammy from [place] about scheduling the pt a hem appt. Appt has been scheduled for the pt to see Dr. Irene Limbo on 5/8 at 11am. Will mail the pt a letter w/appt date and time.

## 2016-06-05 DIAGNOSIS — Z86711 Personal history of pulmonary embolism: Secondary | ICD-10-CM | POA: Diagnosis not present

## 2016-06-05 DIAGNOSIS — Z7901 Long term (current) use of anticoagulants: Secondary | ICD-10-CM | POA: Diagnosis not present

## 2016-06-17 DIAGNOSIS — Z86711 Personal history of pulmonary embolism: Secondary | ICD-10-CM | POA: Diagnosis not present

## 2016-06-17 DIAGNOSIS — Z7901 Long term (current) use of anticoagulants: Secondary | ICD-10-CM | POA: Diagnosis not present

## 2016-06-25 DIAGNOSIS — Z7901 Long term (current) use of anticoagulants: Secondary | ICD-10-CM | POA: Diagnosis not present

## 2016-06-25 DIAGNOSIS — Z86711 Personal history of pulmonary embolism: Secondary | ICD-10-CM | POA: Diagnosis not present

## 2016-07-01 ENCOUNTER — Ambulatory Visit (HOSPITAL_BASED_OUTPATIENT_CLINIC_OR_DEPARTMENT_OTHER): Payer: Medicare HMO | Admitting: Hematology

## 2016-07-01 ENCOUNTER — Ambulatory Visit (HOSPITAL_BASED_OUTPATIENT_CLINIC_OR_DEPARTMENT_OTHER): Payer: Medicare HMO

## 2016-07-01 ENCOUNTER — Telehealth: Payer: Self-pay | Admitting: Hematology

## 2016-07-01 ENCOUNTER — Other Ambulatory Visit (HOSPITAL_COMMUNITY)
Admission: RE | Admit: 2016-07-01 | Discharge: 2016-07-01 | Disposition: A | Discharge status: Home / Self Care | Payer: Medicare HMO | Source: Ambulatory Visit | Attending: Hematology | Admitting: Hematology

## 2016-07-01 ENCOUNTER — Encounter: Payer: Self-pay | Admitting: Hematology

## 2016-07-01 VITALS — BP 177/86 | HR 89 | Temp 98.8°F | Resp 18 | Ht 65.0 in | Wt 128.2 lb

## 2016-07-01 DIAGNOSIS — D509 Iron deficiency anemia, unspecified: Secondary | ICD-10-CM

## 2016-07-01 DIAGNOSIS — D519 Vitamin B12 deficiency anemia, unspecified: Secondary | ICD-10-CM

## 2016-07-01 DIAGNOSIS — D72829 Elevated white blood cell count, unspecified: Secondary | ICD-10-CM | POA: Diagnosis not present

## 2016-07-01 DIAGNOSIS — D696 Thrombocytopenia, unspecified: Secondary | ICD-10-CM | POA: Diagnosis not present

## 2016-07-01 DIAGNOSIS — D72820 Lymphocytosis (symptomatic): Secondary | ICD-10-CM | POA: Insufficient documentation

## 2016-07-01 DIAGNOSIS — I10 Essential (primary) hypertension: Secondary | ICD-10-CM

## 2016-07-01 DIAGNOSIS — E538 Deficiency of other specified B group vitamins: Secondary | ICD-10-CM

## 2016-07-01 DIAGNOSIS — D500 Iron deficiency anemia secondary to blood loss (chronic): Secondary | ICD-10-CM

## 2016-07-01 LAB — COMPREHENSIVE METABOLIC PANEL
ALT: 13 U/L (ref 0–55)
AST: 18 U/L (ref 5–34)
Albumin: 3.7 g/dL (ref 3.5–5.0)
Alkaline Phosphatase: 67 U/L (ref 40–150)
Anion Gap: 9 mEq/L (ref 3–11)
BUN: 12.3 mg/dL (ref 7.0–26.0)
CALCIUM: 10.2 mg/dL (ref 8.4–10.4)
CHLORIDE: 109 meq/L (ref 98–109)
CO2: 25 mEq/L (ref 22–29)
Creatinine: 0.9 mg/dL (ref 0.6–1.1)
EGFR: 69 mL/min/{1.73_m2} — AB (ref >90)
GLUCOSE: 86 mg/dL (ref 70–140)
Potassium: 3.3 mEq/L — ABNORMAL LOW (ref 3.5–5.1)
SODIUM: 143 meq/L (ref 136–145)
Total Bilirubin: 0.97 mg/dL (ref 0.20–1.20)
Total Protein: 7.9 g/dL (ref 6.4–8.3)

## 2016-07-01 LAB — CBC & DIFF AND RETIC
BASO%: 0.2 % (ref 0.0–2.0)
BASOS ABS: 0 10*3/uL (ref 0.0–0.1)
EOS ABS: 0.2 10*3/uL (ref 0.0–0.5)
EOS%: 1.8 % (ref 0.0–7.0)
HCT: 35.6 % (ref 34.8–46.6)
HGB: 11 g/dL — ABNORMAL LOW (ref 11.6–15.9)
IMMATURE RETIC FRACT: 9.1 % (ref 1.60–10.00)
LYMPH#: 9.4 10*3/uL — AB (ref 0.9–3.3)
LYMPH%: 71.5 % — AB (ref 14.0–49.7)
MCH: 25 pg — ABNORMAL LOW (ref 25.1–34.0)
MCHC: 30.9 g/dL — AB (ref 31.5–36.0)
MCV: 80.9 fL (ref 79.5–101.0)
MONO#: 1.1 10*3/uL — AB (ref 0.1–0.9)
MONO%: 8.6 % (ref 0.0–14.0)
NEUT#: 2.4 10*3/uL (ref 1.5–6.5)
NEUT%: 17.9 % — AB (ref 38.4–76.8)
Platelets: 130 10*3/uL — ABNORMAL LOW (ref 145–400)
RBC: 4.4 10*6/uL (ref 3.70–5.45)
RDW: 17.8 % — ABNORMAL HIGH (ref 11.2–14.5)
RETIC CT ABS: 76.12 10*3/uL (ref 33.70–90.70)
Retic %: 1.73 % (ref 0.70–2.10)
WBC: 13.1 10*3/uL — ABNORMAL HIGH (ref 3.9–10.3)

## 2016-07-01 LAB — IRON AND TIBC
%SAT: 11 % — ABNORMAL LOW (ref 21–57)
IRON: 35 ug/dL — AB (ref 41–142)
TIBC: 311 ug/dL (ref 236–444)
UIBC: 276 ug/dL (ref 120–384)

## 2016-07-01 LAB — LACTATE DEHYDROGENASE: LDH: 221 U/L (ref 125–245)

## 2016-07-01 LAB — TECHNOLOGIST REVIEW

## 2016-07-01 LAB — TSH: TSH: 1.438 m(IU)/L (ref 0.308–3.960)

## 2016-07-01 LAB — FERRITIN: Ferritin: 74 ng/ml (ref 9–269)

## 2016-07-01 MED ORDER — B-12 1000 MCG SL SUBL: 1000.0000 ug | SUBLINGUAL_TABLET | Freq: Every day | SUBLINGUAL | 3 refills | Status: DC

## 2016-07-01 NOTE — Progress Notes (Signed)
Marland Kitchen    HEMATOLOGY/ONCOLOGY CONSULTATION NOTE  Date of Service: 07/01/2016  Patient Care Team: Carol Ada, MD as PCP - General (Family Medicine)  CHIEF COMPLAINTS/PURPOSE OF CONSULTATION:  Lymphocytosis Iron deficiency anemia History of B12 deficiency  HISTORY OF PRESENTING ILLNESS:   Sara Butler is a wonderful 83 y.o. female who has been referred to [place] by Dr .Carol Ada, MD  for evaluation and management of increasing lymphocytosis, anemia and thrombocytopenia.  Patient has a history of hypertension, dyslipidemia, paroxysmal atrial fibrillation on Coumadin, previous history of pulmonary embolism in 2013, history of iron deficiency due to chronic GI losses on oral iron therapy, GERD.  Patient had recent labs with her primary care physician on 05/01/2016 which showed total WBC count of 10.2k with 6.6k lymphocytes, mild anemia with hemoglobin of 11.4 and somewhat microcytic in dialysis with an MCV of 80 and an RDW of 19. She was also noted to have mild thrombocytopenia with a platelet count of 143k.  Patient had workup including getting TSH which is within normal limits. Noted to have significant B12 deficiency with a B12 level of 116. Has been started on oral B12 5000 g daily by her primary care physician. Recommended considering sublingual B12 replacement. Would need to rule out pernicious anemia.   Patient notes that she has been on oral iron replacement on and off.  She reports issues with what is thought to be irritable bowel syndrome for which she is on Myrbetriq.  Patient notes that she had some abdominal discomfort and had a CT of the abdomen and pelvis on 01/01/2016 which showed Prominent bilateral inguinal as well as gastrohepatic lymphadenopathy. These are new from the prior exam of 2014 in the gastrohepatic changes appear to be new from the recent exam from 06/20/2015.  Patient notes no fevers no chills no night sweats no significant unexpected weight loss. She denies  any overt GI bleeding. No other issues with overt nosebleeds gum bleeds or other sources of bleeding.   MEDICAL HISTORY:  Past Medical History:  Diagnosis Date  . Atrial fibrillation ([place])   . Bleeding from anus   . Cervical cancer ([place])   . Diverticulitis of colon   . GERD (gastroesophageal reflux disease)   . Gout   . Hyperlipidemia   . Hypertension   . Pulmonary embolism ([facility])   Iron deficiency anemia due to chronic blood loss Paroxysmal atrial fibrillation on Coumadin Diverticulosis of the large and this time Vitamin D deficiency Previous history of pulmonary embolism in 2013 Overactive bladder Osteoarthritis of the knee Chronic back pain History of previous GI bleed in 2011 (while on pradaxa)  SURGICAL HISTORY: Past Surgical History:  Procedure Laterality Date  . ABDOMINAL HYSTERECTOMY    . BACK SURGERY      SOCIAL HISTORY: Social History   Social History  . Marital status: Widowed    Spouse name: N/A  . Number of children: 4  . Years of education: 12   Occupational History  . Retired Museum/gallery curator at [employer]  . Smoking status: Former Smoker    Packs/day: 0.10    Years: 58.00    Types: Cigarettes  . Smokeless tobacco: Never Used  . Alcohol use No  . Drug use: No  . Sexual activity: No   Other Topics Concern  . Not on file   Social History Narrative   Widowed. Lives alone.  Normally independent of ADLs and ambulation.    FAMILY HISTORY: Family History  Problem Relation Age of Onset  . Heart failure Mother   . Diabetes Brother   . Cancer Neg Hx     ALLERGIES:  has No Known Allergies.  MEDICATIONS:  Current Outpatient Prescriptions  Medication Sig Dispense Refill  . acetaminophen (TYLENOL) 500 MG tablet Take 500 mg by mouth every 6 (six) hours as needed for headache.    Marland Kitchen amLODipine (NORVASC) 2.5 MG tablet Take 2.5 mg by mouth daily.    . Cyanocobalamin (B-12) 5000 MCG CAPS Take by mouth.    . diclofenac sodium  (VOLTAREN) 1 % GEL Apply 2 g topically daily as needed. For pain on legs    . mirabegron ER (MYRBETRIQ) 50 MG TB24 tablet Take 50 mg by mouth daily.    Marland Kitchen omeprazole (PRILOSEC) 20 MG capsule Take 20 mg by mouth daily.    Marland Kitchen warfarin (COUMADIN) 5 MG tablet Take 5-7.5 mg by mouth daily. On Monday, Wednesday, and Friday take 1 tab (5mg ) All other days take 1 1/2 tablet (7.5mg )     No current facility-administered medications for this visit.     REVIEW OF SYSTEMS:    10 Point review of Systems was done is negative except as noted above.  PHYSICAL EXAMINATION: ECOG PERFORMANCE STATUS: 2 - Symptomatic, <50% confined to bed  . Vitals:   07/01/16 1105  BP: (!) 177/86  Pulse: 89  Resp: 18  Temp: 98.8 F (37.1 C)   Filed Weights   07/01/16 1105  Weight: 128 lb 3.2 oz (58.2 kg)   .Body mass index is 21.33 kg/m.  GENERAL:alert, in no acute distress and comfortable SKIN: no acute rashes, no significant lesions EYES: conjunctiva are pink and non-injected, sclera anicteric OROPHARYNX: MMM, no exudates, no oropharyngeal erythema or ulceration NECK: supple, no JVD LYMPH:  no palpable lymphadenopathy in the cervical, axillary regions. Very small borderline palpable inguinal lymph nodes. LUNGS: clear to auscultation b/l with normal respiratory effort HEART: Irregular rhythm ABDOMEN:  normoactive bowel sounds , non tender, not distended. Extremity: no pedal edema PSYCH: alert & oriented x 3 with fluent speech NEURO: no focal motor/sensory deficits  LABORATORY DATA:  I have reviewed the data as listed  . CBC Latest Ref Rng & Units 07/01/2016 01/01/2016 06/20/2015  WBC 3.9 - 10.3 10e3/uL 13.1(H) 10.0 8.9  Hemoglobin 11.6 - 15.9 g/dL 11.0(L) 11.3(L) 12.6  Hematocrit 34.8 - 46.6 % 35.6 35.7(L) 39.2  Platelets 145 - 400 10e3/uL 130(L) 118(L) 151   . CBC    Component Value Date/Time   WBC 13.1 (H) 07/01/2016 1246   WBC 10.0 01/01/2016 1424   RBC 4.40 07/01/2016 1246   RBC 4.44 01/01/2016  1424   HGB 11.0 (L) 07/01/2016 1246   HCT 35.6 07/01/2016 1246   PLT 130 (L) 07/01/2016 1246   MCV 80.9 07/01/2016 1246   MCH 25.0 (L) 07/01/2016 1246   MCH 25.5 (L) 01/01/2016 1424   MCHC 30.9 (L) 07/01/2016 1246   MCHC 31.7 01/01/2016 1424   RDW 17.8 (H) 07/01/2016 1246   LYMPHSABS 9.4 (H) 07/01/2016 1246   MONOABS 1.1 (H) 07/01/2016 1246   EOSABS 0.2 07/01/2016 1246   BASOSABS 0.0 07/01/2016 1246    . CMP Latest Ref Rng & Units 07/01/2016 01/01/2016 06/20/2015  Glucose 70 - 140 mg/dl 86 88 95  BUN 7.0 - 26.0 mg/dL 12.3 16 <5(L)  Creatinine 0.6 - 1.1 mg/dL 0.9 0.95 0.83  Sodium 136 - 145 mEq/L 143 138 143  Potassium 3.5 - 5.1 mEq/L 3.3(L) 3.7 4.5  Chloride 101 - 111 mmol/L - 109 111  CO2 22 - 29 mEq/L 25 23 26   Calcium 8.4 - 10.4 mg/dL 10.2 9.8 9.9  Total Protein 6.4 - 8.3 g/dL 7.9 7.4 7.2  Total Bilirubin 0.20 - 1.20 mg/dL 0.97 0.7 0.8  Alkaline Phos 40 - 150 U/L 67 60 55  AST 5 - 34 U/L 18 25 21   ALT 0 - 55 U/L 13 23 14    Component     Latest Ref Rng & Units 07/01/2016  Iron     41 - 142 ug/dL 35 (L)  TIBC     236 - 444 ug/dL 311  UIBC     120 - 384 ug/dL 276  %SAT     21 - 57 % 11 (L)  T3 Uptake Ratio     24 - 39 % 26  Free Thyroxine Index     1.2 - 4.9 2.1  LDH     125 - 245 U/L 221  Vitamin B12     232 - 1,245 pg/mL 295  Ferritin     9 - 269 ng/ml 74  Thyroxine (T4)     4.5 - 12.0 ug/dL 8.1  T4,Free(Direct)     0.82 - 1.77 ng/dL 1.18  TSH     0.308 - 3.960 m(IU)/L 1.438    RADIOGRAPHIC STUDIES: I have personally reviewed the radiological images as listed and agreed with the findings in the report. No results found.  ASSESSMENT & PLAN:   83 year old female with multiple medical comorbidities with  #1 Newly noted leukocytosis with increasing lymphocytosis. Her WBC count is 13.1k with 9.4k lymphocytes which are up from 6.6k lymphocytes about 2 months ago. Concern for possible chronic lymphocytic leukemia versus leukemic phase non-Hodgkin's lymphoma  versus less likely reactive lymphocytosis. Patient did have some borderline abdominal and inguinal lymphadenopathy on her CT abdomen pelvis in November 2017.  Plan -CBC shows some lymphocytosis and minimal anemia and mild thrombo-cytopenia. -Peripheral blood smear -Flow cytometry sent today to evaluate the lymphocytes for monoclonality. -If this is not CLL the patient might need a PET CT scan to evaluate for lymphadenopathy further  #2 anemia - microcytic anemia with some element to iron deficiency. Ferritin level of 74 with an iron saturation of 11%. #3 B12 deficiency. B12 levels are coming up from 116 now up to 295 with oral B12 replacement. Plan -We rechecked iron and B12 levels as noted above. -We have sent out parietal cell antibodies and anti-intrinsic factor antibodies to rule out pernicious anemia. -Recommended switching to liq B12  sublingual preparation and  given a prescription to start taking after she runs out of her oral B12. -Maintain B12 levels about 500. -Continue oral iron replacement for primary care physician. Would recommend iron polysaccharide 150 mg orally daily to maintain ferritin levels more than 100 and iron saturation of more than 20%. -No indication for IV iron at this time  #4 mild thrombocytopenic- could be related to her etiology of lymphocytosis. -Will continue to monitor.  Labs today  RTC in 3 weeks with Dr. Irene Limbo to discuss lab results and further evaluation and management  All of the patients were answered with apparent satisfaction. The patient knows to call the clinic with any problems, questions or concerns.  I spent 45 minutes counseling the patient face to face. The total time spent in the appointment was 60 minutes and more than 50% was on counseling and direct patient cares.    Sullivan Lone MD [place] AAHIVMS [facility]  [facility] Hematology/Oncology Physician [facility]  (Office):       [phone] (Work cell):  [phone] (Fax):            [phone]  07/01/2016 11:36 AM

## 2016-07-01 NOTE — Telephone Encounter (Signed)
Appointments schedule per 07/01/16 los. Patient was given a copy of the AVS report and appointment schedule per 07/01/16 los.

## 2016-07-02 LAB — T4: T4 TOTAL: 8.1 ug/dL (ref 4.5–12.0)

## 2016-07-02 LAB — FOLATE RBC
Folate, Hemolysate: 318.6 ng/mL
Folate, RBC: 929 ng/mL (ref >498)
Hematocrit: 34.3 % (ref 34.0–46.6)

## 2016-07-02 LAB — T4, FREE: FREE T4: 1.18 ng/dL (ref 0.82–1.77)

## 2016-07-02 LAB — ANTI-PARIETAL ANTIBODY: PARIETAL CELL AB: 5.7 U (ref 0.0–20.0)

## 2016-07-02 LAB — T3 UPTAKE
FREE THYROXINE INDEX: 2.1 (ref 1.2–4.9)
T3 Uptake Ratio: 26 % (ref 24–39)

## 2016-07-02 LAB — INTRINSIC FACTOR ANTIBODIES: INTRINSIC FACTOR ABS, SERUM: 0.9 [AU]/ml (ref 0.0–1.1)

## 2016-07-02 LAB — VITAMIN B12: VITAMIN B 12: 295 pg/mL (ref 232–1245)

## 2016-07-07 LAB — FLOW CYTOMETRY

## 2016-07-09 DIAGNOSIS — Z86711 Personal history of pulmonary embolism: Secondary | ICD-10-CM | POA: Diagnosis not present

## 2016-07-09 DIAGNOSIS — Z7901 Long term (current) use of anticoagulants: Secondary | ICD-10-CM | POA: Diagnosis not present

## 2016-07-15 ENCOUNTER — Telehealth: Payer: Self-pay | Admitting: Hematology

## 2016-07-15 NOTE — Telephone Encounter (Signed)
Called patient to notify her of the appointment change and there was no answer and was not able to leave a VM

## 2016-07-22 ENCOUNTER — Ambulatory Visit: Payer: Medicare HMO | Admitting: Hematology

## 2016-07-24 ENCOUNTER — Ambulatory Visit: Payer: Medicare HMO | Admitting: Hematology

## 2016-07-25 ENCOUNTER — Ambulatory Visit (HOSPITAL_BASED_OUTPATIENT_CLINIC_OR_DEPARTMENT_OTHER): Payer: Medicare HMO

## 2016-07-25 ENCOUNTER — Encounter: Payer: Self-pay | Admitting: Hematology

## 2016-07-25 ENCOUNTER — Ambulatory Visit (HOSPITAL_BASED_OUTPATIENT_CLINIC_OR_DEPARTMENT_OTHER): Payer: Medicare HMO | Admitting: Hematology

## 2016-07-25 ENCOUNTER — Telehealth: Payer: Self-pay | Admitting: Hematology

## 2016-07-25 VITALS — BP 153/75 | HR 72 | Temp 98.5°F | Resp 17 | Ht 65.0 in | Wt 123.5 lb

## 2016-07-25 DIAGNOSIS — D72829 Elevated white blood cell count, unspecified: Secondary | ICD-10-CM | POA: Diagnosis not present

## 2016-07-25 DIAGNOSIS — E538 Deficiency of other specified B group vitamins: Secondary | ICD-10-CM

## 2016-07-25 DIAGNOSIS — D696 Thrombocytopenia, unspecified: Secondary | ICD-10-CM

## 2016-07-25 DIAGNOSIS — C8308 Small cell B-cell lymphoma, lymph nodes of multiple sites: Secondary | ICD-10-CM | POA: Diagnosis not present

## 2016-07-25 DIAGNOSIS — D72820 Lymphocytosis (symptomatic): Secondary | ICD-10-CM | POA: Diagnosis not present

## 2016-07-25 DIAGNOSIS — D509 Iron deficiency anemia, unspecified: Secondary | ICD-10-CM

## 2016-07-25 LAB — CBC & DIFF AND RETIC
BASO%: 0.3 % (ref 0.0–2.0)
BASOS ABS: 0 10*3/uL (ref 0.0–0.1)
EOS ABS: 0.2 10*3/uL (ref 0.0–0.5)
EOS%: 1.8 % (ref 0.0–7.0)
HEMATOCRIT: 33.5 % — AB (ref 34.8–46.6)
HEMOGLOBIN: 10.4 g/dL — AB (ref 11.6–15.9)
IMMATURE RETIC FRACT: 3.7 % (ref 1.60–10.00)
LYMPH#: 6.8 10*3/uL — AB (ref 0.9–3.3)
LYMPH%: 67.2 % — ABNORMAL HIGH (ref 14.0–49.7)
MCH: 25.4 pg (ref 25.1–34.0)
MCHC: 31 g/dL — ABNORMAL LOW (ref 31.5–36.0)
MCV: 81.7 fL (ref 79.5–101.0)
MONO#: 0.8 10*3/uL (ref 0.1–0.9)
MONO%: 8.1 % (ref 0.0–14.0)
NEUT%: 22.6 % — ABNORMAL LOW (ref 38.4–76.8)
NEUTROS ABS: 2.3 10*3/uL (ref 1.5–6.5)
Platelets: 130 10*3/uL — ABNORMAL LOW (ref 145–400)
RBC: 4.1 10*6/uL (ref 3.70–5.45)
RDW: 17.7 % — AB (ref 11.2–14.5)
RETIC %: 0.88 % (ref 0.70–2.10)
RETIC CT ABS: 36.08 10*3/uL (ref 33.70–90.70)
WBC: 10.1 10*3/uL (ref 3.9–10.3)

## 2016-07-25 LAB — TECHNOLOGIST REVIEW

## 2016-07-25 MED ORDER — POLYSACCHARIDE IRON COMPLEX 150 MG PO CAPS: 150.0000 mg | ORAL_CAPSULE | Freq: Every day | ORAL | 2 refills | Status: DC

## 2016-07-25 NOTE — Patient Instructions (Signed)
Thank you for choosing [facility] to provide your oncology and hematology care.  To afford each patient quality time with our providers, please arrive 30 minutes before your scheduled appointment time.  If you arrive late for your appointment, you may be asked to reschedule.  We strive to give you quality time with our providers, and arriving late affects you and other patients whose appointments are after yours.  If you are a no show for multiple scheduled visits, you may be dismissed from the clinic at the providers discretion.   Again, thank you for choosing [facility], our hope is that these requests will decrease the amount of time that you wait before being seen by our physicians.  ______________________________________________________________________ Should you have questions after your visit to the [facility], please contact our office at [phone] between the hours of 8:30 and 4:30 p.m.    Voicemails left after 4:30p.m will not be returned until the following business day.   For prescription refill requests, please have your pharmacy contact us directly.  Please also try to allow 48 hours for prescription requests.   Please contact the scheduling department for questions regarding scheduling.  For scheduling of procedures such as PET scans, CT scans, MRI, Ultrasound, etc please contact central scheduling at [phone].   Resources For Cancer Patients and Caregivers:  American Cancer Society:  800-227-2345  Can help patients locate various types of support and financial assistance Cancer Care: 1-800-813-HOPE (4673) Provides financial assistance, online support groups, medication/co-pay assistance.   [place] DSS:  336-641-3447 Where to apply for food stamps, Medicaid, and utility assistance Medicare Rights Center: 800-333-4114 Helps people with Medicare understand their rights and benefits, navigate the Medicare system, and secure the  quality healthcare they deserve SCAT: 336-333-6589 [place] [employer]'s shared-ride transportation service for eligible riders who have a disability that prevents them from riding the fixed route bus.   For additional information on assistance programs please contact our social worker:   Grier Hock/Abigail Elmore:  [phone] 

## 2016-07-25 NOTE — Telephone Encounter (Signed)
Gave patient AVS and calender per 6/1 LOS - Central radiology to contact patient with ct/pet schedule.

## 2016-07-29 DIAGNOSIS — Z86711 Personal history of pulmonary embolism: Secondary | ICD-10-CM | POA: Diagnosis not present

## 2016-07-29 DIAGNOSIS — Z7901 Long term (current) use of anticoagulants: Secondary | ICD-10-CM | POA: Diagnosis not present

## 2016-07-31 LAB — FISH,CLL PROGNOSTIC PANEL

## 2016-08-12 DIAGNOSIS — Z86711 Personal history of pulmonary embolism: Secondary | ICD-10-CM | POA: Diagnosis not present

## 2016-08-12 DIAGNOSIS — Z7901 Long term (current) use of anticoagulants: Secondary | ICD-10-CM | POA: Diagnosis not present

## 2016-08-18 ENCOUNTER — Encounter (HOSPITAL_COMMUNITY)
Admission: RE | Admit: 2016-08-18 | Discharge: 2016-08-18 | Disposition: A | Discharge status: Home / Self Care | Payer: Medicare HMO | Source: Ambulatory Visit | Attending: Hematology | Admitting: Hematology

## 2016-08-18 DIAGNOSIS — C8590 Non-Hodgkin lymphoma, unspecified, unspecified site: Secondary | ICD-10-CM | POA: Diagnosis not present

## 2016-08-18 DIAGNOSIS — C8308 Small cell B-cell lymphoma, lymph nodes of multiple sites: Secondary | ICD-10-CM | POA: Insufficient documentation

## 2016-08-18 LAB — GLUCOSE, CAPILLARY: GLUCOSE-CAPILLARY: 88 mg/dL (ref 65–99)

## 2016-08-18 MED ORDER — FLUDEOXYGLUCOSE F - 18 (FDG) INJECTION
6.1000 | Freq: Once | INTRAVENOUS | Status: AC | PRN
  Administered 2016-08-18: 6.1 via INTRAVENOUS

## 2016-08-22 ENCOUNTER — Telehealth: Payer: Self-pay | Admitting: Hematology

## 2016-08-22 ENCOUNTER — Encounter: Payer: Self-pay | Admitting: Hematology

## 2016-08-22 ENCOUNTER — Ambulatory Visit (HOSPITAL_BASED_OUTPATIENT_CLINIC_OR_DEPARTMENT_OTHER): Payer: Medicare HMO | Admitting: Hematology

## 2016-08-22 VITALS — BP 161/77 | HR 58 | Temp 100.0°F | Resp 18 | Ht 65.0 in | Wt 129.3 lb

## 2016-08-22 DIAGNOSIS — C8308 Small cell B-cell lymphoma, lymph nodes of multiple sites: Secondary | ICD-10-CM

## 2016-08-22 DIAGNOSIS — D72820 Lymphocytosis (symptomatic): Secondary | ICD-10-CM

## 2016-08-22 DIAGNOSIS — D509 Iron deficiency anemia, unspecified: Secondary | ICD-10-CM | POA: Diagnosis not present

## 2016-08-22 DIAGNOSIS — E538 Deficiency of other specified B group vitamins: Secondary | ICD-10-CM

## 2016-08-22 DIAGNOSIS — D72829 Elevated white blood cell count, unspecified: Secondary | ICD-10-CM | POA: Diagnosis not present

## 2016-08-22 DIAGNOSIS — D519 Vitamin B12 deficiency anemia, unspecified: Secondary | ICD-10-CM

## 2016-08-22 DIAGNOSIS — D500 Iron deficiency anemia secondary to blood loss (chronic): Secondary | ICD-10-CM

## 2016-08-22 DIAGNOSIS — D696 Thrombocytopenia, unspecified: Secondary | ICD-10-CM | POA: Diagnosis not present

## 2016-08-22 NOTE — Patient Instructions (Signed)
Thank you for choosing [facility] to provide your oncology and hematology care.  To afford each patient quality time with our providers, please arrive 30 minutes before your scheduled appointment time.  If you arrive late for your appointment, you may be asked to reschedule.  We strive to give you quality time with our providers, and arriving late affects you and other patients whose appointments are after yours.  If you are a no show for multiple scheduled visits, you may be dismissed from the clinic at the providers discretion.   Again, thank you for choosing [facility], our hope is that these requests will decrease the amount of time that you wait before being seen by our physicians.  ______________________________________________________________________ Should you have questions after your visit to the [facility], please contact our office at [phone] between the hours of 8:30 and 4:30 p.m.    Voicemails left after 4:30p.m will not be returned until the following business day.   For prescription refill requests, please have your pharmacy contact us directly.  Please also try to allow 48 hours for prescription requests.   Please contact the scheduling department for questions regarding scheduling.  For scheduling of procedures such as PET scans, CT scans, MRI, Ultrasound, etc please contact central scheduling at [phone].   Resources For Cancer Patients and Caregivers:  American Cancer Society:  800-227-2345  Can help patients locate various types of support and financial assistance Cancer Care: 1-800-813-HOPE (4673) Provides financial assistance, online support groups, medication/co-pay assistance.   [place] DSS:  336-641-3447 Where to apply for food stamps, Medicaid, and utility assistance Medicare Rights Center: 800-333-4114 Helps people with Medicare understand their rights and benefits, navigate the Medicare system, and secure the  quality healthcare they deserve SCAT: 336-333-6589 [place] [employer]'s shared-ride transportation service for eligible riders who have a disability that prevents them from riding the fixed route bus.   For additional information on assistance programs please contact our social worker:   Grier Hock/Abigail Elmore:  [phone] 

## 2016-08-22 NOTE — Telephone Encounter (Signed)
Scheduled appt per 6/29 los - Gave patient AVS and calender per los.  

## 2016-08-25 ENCOUNTER — Other Ambulatory Visit: Payer: Self-pay | Admitting: Oncology

## 2016-08-25 NOTE — Progress Notes (Unsigned)
I was called by Dr. Tamala Julian regarding a lymph node biopsy planned for Ms. Frier. Radiology feels the patient needs to be off Coumadin for the procedure. Dr. Tamala Julian is very concerned because of the patient's history of DVT and PE. Certainly bridging could be done. We do have a flow cytometry from May 10 which shows a monoclonal population which is CD20 positive, with no CD5 or CD10.  Given the difficulty encountered I am going to cancel the planned biopsy and let Dr. Jenell Milliner decide whether he wishes to bridge the patient or whether he wishes to proceed to empiric treatment based on the information already available

## 2016-08-26 DIAGNOSIS — Z7901 Long term (current) use of anticoagulants: Secondary | ICD-10-CM | POA: Diagnosis not present

## 2016-08-26 DIAGNOSIS — Z86711 Personal history of pulmonary embolism: Secondary | ICD-10-CM | POA: Diagnosis not present

## 2016-08-28 NOTE — Progress Notes (Signed)
Marland Kitchen    HEMATOLOGY/ONCOLOGY CLINIC NOTE  Date of Service:.07/25/2016  Patient Care Team: Carol Ada, MD as PCP - General (Family Medicine)  CHIEF COMPLAINTS/PURPOSE OF CONSULTATION:  Lymphocytosis Iron deficiency anemia History of B12 deficiency  HISTORY OF PRESENTING ILLNESS:   Sara Butler is a wonderful 83 y.o. female who has been referred to [place] by Dr .Carol Ada, MD  for evaluation and management of increasing lymphocytosis, anemia and thrombocytopenia.  Patient has a history of hypertension, dyslipidemia, paroxysmal atrial fibrillation on Coumadin, previous history of pulmonary embolism in 2013, history of iron deficiency due to chronic GI losses on oral iron therapy, GERD.  Patient had recent labs with her primary care physician on 05/01/2016 which showed total WBC count of 10.2k with 6.6k lymphocytes, mild anemia with hemoglobin of 11.4 and somewhat microcytic in dialysis with an MCV of 80 and an RDW of 19. She was also noted to have mild thrombocytopenia with a platelet count of 143k.  Patient had workup including getting TSH which is within normal limits. Noted to have significant B12 deficiency with a B12 level of 116. Has been started on oral B12 5000 g daily by her primary care physician. Recommended considering sublingual B12 replacement. Would need to rule out pernicious anemia.   Patient notes that she has been on oral iron replacement on and off.  She reports issues with what is thought to be irritable bowel syndrome for which she is on Myrbetriq.  Patient notes that she had some abdominal discomfort and had a CT of the abdomen and pelvis on 01/01/2016 which showed Prominent bilateral inguinal as well as gastrohepatic lymphadenopathy. These are new from the prior exam of 2014 in the gastrohepatic changes appear to be new from the recent exam from 06/20/2015.  Patient notes no fevers no chills no night sweats no significant unexpected weight loss. She denies any  overt GI bleeding. No other issues with overt nosebleeds gum bleeds or other sources of bleeding.  INTERVAL HISTORY  Patient is here to f/u on her initial lab workup. Flow cytometry concerning for CD20+ CD5 and Cd10 neg lymphoproliferative disorder likely a low grade NHL. Nofevers/chills/night sweats. We discussed the diagnostic possibilities and recommended PET/CT to evaluate for evidenceof NHL and discussed that she might need BM Bx or LN Bx for definitive diagnosis eventually. We discussed this in the setting of her goals of care. She wanted to first proceed with PET/CT   MEDICAL HISTORY:  Past Medical History:  Diagnosis Date  . Atrial fibrillation ([place])   . Bleeding from anus   . Cervical cancer ([place])   . Diverticulitis of colon   . GERD (gastroesophageal reflux disease)   . Gout   . Hyperlipidemia   . Hypertension   . Pulmonary embolism ([facility])   Iron deficiency anemia due to chronic blood loss Paroxysmal atrial fibrillation on Coumadin Diverticulosis of the large and this time Vitamin D deficiency Previous history of pulmonary embolism in 2013 Overactive bladder Osteoarthritis of the knee Chronic back pain History of previous GI bleed in 2011 (while on pradaxa)  SURGICAL HISTORY: Past Surgical History:  Procedure Laterality Date  . ABDOMINAL HYSTERECTOMY    . BACK SURGERY      SOCIAL HISTORY: Social History   Social History  . Marital status: Widowed    Spouse name: N/A  . Number of children: 4  . Years of education: 12   Occupational History  . Retired Museum/gallery curator at [employer]  Topics  . Smoking status: Former Smoker    Packs/day: 0.10    Years: 58.00    Types: Cigarettes  . Smokeless tobacco: Never Used  . Alcohol use No  . Drug use: No  . Sexual activity: No   Other Topics Concern  . Not on file   Social History Narrative   Widowed. Lives alone.  Normally independent of ADLs and ambulation.    FAMILY HISTORY: Family History   Problem Relation Age of Onset  . Heart failure Mother   . Diabetes Brother   . Cancer Neg Hx     ALLERGIES:  has No Known Allergies.  MEDICATIONS:  Current Outpatient Prescriptions  Medication Sig Dispense Refill  . acetaminophen (TYLENOL) 500 MG tablet Take 500 mg by mouth every 6 (six) hours as needed for headache.    Marland Kitchen amLODipine (NORVASC) 2.5 MG tablet Take 2.5 mg by mouth daily.    Marland Kitchen atenolol (TENORMIN) 25 MG tablet Take by mouth daily.    . Cyanocobalamin (B-12) 1000 MCG SUBL Place 1,000 mcg under the tongue daily. 30 each 3  . diclofenac sodium (VOLTAREN) 1 % GEL Apply 2 g topically daily as needed. For pain on legs    . iron polysaccharides (NIFEREX) 150 MG capsule Take 1 capsule (150 mg total) by mouth daily. 30 capsule 2  . mirabegron ER (MYRBETRIQ) 50 MG TB24 tablet Take 50 mg by mouth daily.    Marland Kitchen omeprazole (PRILOSEC) 20 MG capsule Take 20 mg by mouth as needed.     . warfarin (COUMADIN) 5 MG tablet Take 5-7.5 mg by mouth daily. On Monday, Wednesday, and Friday take 1 tab (5mg ) All other days take 1 1/2 tablet (7.5mg )     No current facility-administered medications for this visit.     REVIEW OF SYSTEMS:    10 Point review of Systems was done is negative except as noted above.  PHYSICAL EXAMINATION: ECOG PERFORMANCE STATUS: 2 - Symptomatic, <50% confined to bed  . Vitals:   07/25/16 0924 07/25/16 0925  BP: (!) 153/75 (!) 153/75  Pulse: 72 72  Resp: 17 17  Temp: 98.5 F (36.9 C) 98.5 F (36.9 C)   Filed Weights   07/25/16 0924 07/25/16 0925  Weight: 123 lb 8 oz (56 kg) 123 lb 8 oz (56 kg)   .Body mass index is 20.55 kg/m.  GENERAL:alert, in no acute distress and comfortable SKIN: no acute rashes, no significant lesions EYES: conjunctiva are pink and non-injected, sclera anicteric OROPHARYNX: MMM, no exudates, no oropharyngeal erythema or ulceration NECK: supple, no JVD LYMPH:  no palpable lymphadenopathy in the cervical, axillary regions. Very small  borderline palpable inguinal lymph nodes. LUNGS: clear to auscultation b/l with normal respiratory effort HEART: Irregular rhythm ABDOMEN:  normoactive bowel sounds , non tender, not distended. Extremity: no pedal edema PSYCH: alert & oriented x 3 with fluent speech NEURO: no focal motor/sensory deficits  LABORATORY DATA:  I have reviewed the data as listed  . CBC Latest Ref Rng & Units 07/25/2016 07/01/2016 07/01/2016  WBC 3.9 - 10.3 10e3/uL 10.1 13.1(H) -  Hemoglobin 11.6 - 15.9 g/dL 10.4(L) 11.0(L) -  Hematocrit 34.8 - 46.6 % 33.5(L) 35.6 34.3  Platelets 145 - 400 10e3/uL 130(L) 130(L) -   . CBC    Component Value Date/Time   WBC 10.1 07/25/2016 1033   WBC 10.0 01/01/2016 1424   RBC 4.10 07/25/2016 1033   RBC 4.44 01/01/2016 1424   HGB 10.4 (L) 07/25/2016 1033  HCT 33.5 (L) 07/25/2016 1033   PLT 130 (L) 07/25/2016 1033   MCV 81.7 07/25/2016 1033   MCH 25.4 07/25/2016 1033   MCH 25.5 (L) 01/01/2016 1424   MCHC 31.0 (L) 07/25/2016 1033   MCHC 31.7 01/01/2016 1424   RDW 17.7 (H) 07/25/2016 1033   LYMPHSABS 6.8 (H) 07/25/2016 1033   MONOABS 0.8 07/25/2016 1033   EOSABS 0.2 07/25/2016 1033   BASOSABS 0.0 07/25/2016 1033    . CMP Latest Ref Rng & Units 07/01/2016 01/01/2016 06/20/2015  Glucose 70 - 140 mg/dl 86 88 95  BUN 7.0 - 26.0 mg/dL 12.3 16 <5(L)  Creatinine 0.6 - 1.1 mg/dL 0.9 0.95 0.83  Sodium 136 - 145 mEq/L 143 138 143  Potassium 3.5 - 5.1 mEq/L 3.3(L) 3.7 4.5  Chloride 101 - 111 mmol/L - 109 111  CO2 22 - 29 mEq/L 25 23 26   Calcium 8.4 - 10.4 mg/dL 10.2 9.8 9.9  Total Protein 6.4 - 8.3 g/dL 7.9 7.4 7.2  Total Bilirubin 0.20 - 1.20 mg/dL 0.97 0.7 0.8  Alkaline Phos 40 - 150 U/L 67 60 55  AST 5 - 34 U/L 18 25 21   ALT 0 - 55 U/L 13 23 14     Component     Latest Ref Rng & Units 07/01/2016  Iron     41 - 142 ug/dL 35 (L)  TIBC     236 - 444 ug/dL 311  UIBC     120 - 384 ug/dL 276  %SAT     21 - 57 % 11 (L)  T3 Uptake Ratio     24 - 39 % 26  Free Thyroxine  Index     1.2 - 4.9 2.1  LDH     125 - 245 U/L 221  Vitamin B12     232 - 1,245 pg/mL 295  Ferritin     9 - 269 ng/ml 74  Thyroxine (T4)     4.5 - 12.0 ug/dL 8.1  T4,Free(Direct)     0.82 - 1.77 ng/dL 1.18  TSH     0.308 - 3.960 m(IU)/L 1.438    RADIOGRAPHIC STUDIES: I have personally reviewed the radiological images as listed and agreed with the findings in the report.  ASSESSMENT & PLAN:   83 year old female with multiple medical comorbidities with  #1 Newly noted leukocytosis with increasing lymphocytosis. Flowcytometry suggestive fo Cd20+ CD5-CD 10 neg Low grade Non Hodgkins lymphoma. Her WBC count is 13.1k with 9.4k lymphocytes which are up from 6.6k lymphocytes about 2 months ago. Concern for possible chronic lymphocytic leukemia versus leukemic phase non-Hodgkin's lymphoma versus less likely reactive lymphocytosis. Patient did have some borderline abdominal and inguinal lymphadenopathy on her CT abdomen pelvis in November 2017.  Plan -flow cytometry not typical for CLL  -will get PET/CT to evaluate for LNadenopathy/splenomegaly other evidence of NHL and to direct LN biopsy -CLL FISH prognostic panel to determine presence of any typical CLL related mutation that could suggest possiblity of CD5-ve CLL  -will likely need BM Bx or preferably LN bx to make definitive diagnosis.  #2 anemia - microcytic anemia with some element to iron deficiency. Ferritin level of 74 with an iron saturation of 11%. #3 B12 deficiency. B12 levels are coming up from 116 now up to 295 with oral B12 replacement. No evidence of pernicious anemia based on neg antibody testing. Component     Latest Ref Rng & Units 07/01/2016  Vitamin B12     232 - 1,245 pg/mL  295  Intrinsic Factor Abs, Serum     0.0 - 1.1 AU/mL 0.9  Parietal Cell Ab     0.0 - 20.0 Units 5.7   Plan -continue liq B12  sublingual preparation 1090mcg daily and  given a prescription to start taking after she runs out of her oral  B12. -Maintain B12 levels above 500. -Continue oral iron replacement as per primary care physician. Would recommend iron polysaccharide 150 mg orally daily to maintain ferritin levels more than 100 and iron saturation of more than 20%. -No indication for IV iron at this time  #4 mild thrombocytopenic- could be related to her NHL -Will continue to monitor.  Labs today  PET/CT in 1-2 weeks RTC with Dr Irene Limbo in 3-4 with PET/CT results  All of the patients were answered with apparent satisfaction. The patient knows to call the clinic with any problems, questions or concerns.  I spent 20 minutes counseling the patient face to face. The total time spent in the appointment was 30 minutes and more than 50% was on counseling and direct patient cares.    Sullivan Lone MD [place] AAHIVMS [facility] Hematology/Oncology Physician [facility]  (Office):       [phone] (Work cell):  [phone] (Fax):           [phone]

## 2016-08-28 NOTE — Progress Notes (Signed)
Sara Butler Kitchen    HEMATOLOGY/ONCOLOGY CLINIC NOTE  Date of Service:.08/22/2016  Patient Care Team: Carol Ada, MD as PCP - General (Family Medicine)  CHIEF COMPLAINTS/PURPOSE OF CONSULTATION:  Lymphocytosis Iron deficiency anemia History of B12 deficiency  HISTORY OF PRESENTING ILLNESS:   Sara Butler is a wonderful 83 y.o. female who has been referred to [place] by Dr .Carol Ada, MD  for evaluation and management of increasing lymphocytosis, anemia and thrombocytopenia.  Patient has a history of hypertension, dyslipidemia, paroxysmal atrial fibrillation on Coumadin, previous history of pulmonary embolism in 2013, history of iron deficiency due to chronic GI losses on oral iron therapy, GERD.  Patient had recent labs with her primary care physician on 05/01/2016 which showed total WBC count of 10.2k with 6.6k lymphocytes, mild anemia with hemoglobin of 11.4 and somewhat microcytic in dialysis with an MCV of 80 and an RDW of 19. She was also noted to have mild thrombocytopenia with a platelet count of 143k.  Patient had workup including getting TSH which is within normal limits. Noted to have significant B12 deficiency with a B12 level of 116. Has been started on oral B12 5000 g daily by her primary care physician. Recommended considering sublingual B12 replacement. Would need to rule out pernicious anemia.   Patient notes that she has been on oral iron replacement on and off.  She reports issues with what is thought to be irritable bowel syndrome for which she is on Myrbetriq.  Patient notes that she had some abdominal discomfort and had a CT of the abdomen and pelvis on 01/01/2016 which showed Prominent bilateral inguinal as well as gastrohepatic lymphadenopathy. These are new from the prior exam of 2014 in the gastrohepatic changes appear to be new from the recent exam from 06/20/2015.  Patient notes no fevers no chills no night sweats no significant unexpected weight loss. She denies any  overt GI bleeding. No other issues with overt nosebleeds gum bleeds or other sources of bleeding.  INTERVAL HISTORY  Patient is here to f/u on her  CD20+ CD5 and Cd10 neg lymphoproliferative disorder likely a low grade NHL. And to discuss PET/CT Results. She notes she is feeling about the same with no acute new symptoms. PET/CT reviewed in details. Discussed get LN bx in the context of her goals of care. She is agreeable to getting an US guided LN biopsy of inguinal LN.  MEDICAL HISTORY:  Past Medical History:  Diagnosis Date  . Atrial fibrillation ([place])   . Bleeding from anus   . Cervical cancer ([place])   . Diverticulitis of colon   . GERD (gastroesophageal reflux disease)   . Gout   . Hyperlipidemia   . Hypertension   . Pulmonary embolism ([facility])   Iron deficiency anemia due to chronic blood loss Paroxysmal atrial fibrillation on Coumadin Diverticulosis of the large and this time Vitamin D deficiency Previous history of pulmonary embolism in 2013 Overactive bladder Osteoarthritis of the knee Chronic back pain History of previous GI bleed in 2011 (while on pradaxa)  SURGICAL HISTORY: Past Surgical History:  Procedure Laterality Date  . ABDOMINAL HYSTERECTOMY    . BACK SURGERY      SOCIAL HISTORY: Social History   Social History  . Marital status: Widowed    Spouse name: N/A  . Number of children: 4  . Years of education: 12   Occupational History  . 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 Smoking status: Former Smoker  Packs/day: 0.10    Years: 58.00    Types: Cigarettes  . Smokeless tobacco: Never Used  . Alcohol use No  . Drug use: No  . Sexual activity: No   Other Topics Concern  . Not on file   Social History Narrative   Widowed. Lives alone.  Normally independent of ADLs and ambulation.    FAMILY HISTORY: Family History  Problem Relation Age of Onset  . Heart failure Mother   . Diabetes Brother   . Cancer Neg Hx      ALLERGIES:  has No Known Allergies.  MEDICATIONS:  Current Outpatient Prescriptions  Medication Sig Dispense Refill  . atenolol (TENORMIN) 25 MG tablet Take by mouth daily.    Sara Butler Kitchen acetaminophen (TYLENOL) 500 MG tablet Take 500 mg by mouth every 6 (six) hours as needed for headache.    Sara Butler Kitchen amLODipine (NORVASC) 2.5 MG tablet Take 2.5 mg by mouth daily.    . Cyanocobalamin (B-12) 1000 MCG SUBL Place 1,000 mcg under the tongue daily. 30 each 3  . diclofenac sodium (VOLTAREN) 1 % GEL Apply 2 g topically daily as needed. For pain on legs    . iron polysaccharides (NIFEREX) 150 MG capsule Take 1 capsule (150 mg total) by mouth daily. 30 capsule 2  . mirabegron ER (MYRBETRIQ) 50 MG TB24 tablet Take 50 mg by mouth daily.    Sara Butler Kitchen omeprazole (PRILOSEC) 20 MG capsule Take 20 mg by mouth as needed.     . warfarin (COUMADIN) 5 MG tablet Take 5-7.5 mg by mouth daily. On Monday, Wednesday, and Friday take 1 tab (5mg ) All other days take 1 1/2 tablet (7.5mg )     No current facility-administered medications for this visit.     REVIEW OF SYSTEMS:    10 Point review of Systems was done is negative except as noted above.  PHYSICAL EXAMINATION: ECOG PERFORMANCE STATUS: 2 - Symptomatic, <50% confined to bed  . Vitals:   08/22/16 1032  BP: (!) 161/77  Pulse: (!) 58  Resp: 18  Temp: 100 F (37.8 C)   Filed Weights   08/22/16 1032  Weight: 129 lb 4.8 oz (58.7 kg)   .Body mass index is 21.52 kg/m.  GENERAL:alert, in no acute distress and comfortable SKIN: no acute rashes, no significant lesions EYES: conjunctiva are pink and non-injected, sclera anicteric OROPHARYNX: MMM, no exudates, no oropharyngeal erythema or ulceration NECK: supple, no JVD LYMPH:  no palpable lymphadenopathy in the cervical, axillary regions. Very small borderline palpable inguinal lymph nodes. LUNGS: clear to auscultation b/l with normal respiratory effort HEART: Irregular rhythm ABDOMEN:  normoactive bowel sounds , non  tender, not distended. Extremity: no pedal edema PSYCH: alert & oriented x 3 with fluent speech NEURO: no focal motor/sensory deficits  LABORATORY DATA:  I have reviewed the data as listed  . CBC Latest Ref Rng & Units 07/25/2016 07/01/2016 07/01/2016  WBC 3.9 - 10.3 10e3/uL 10.1 13.1(H) -  Hemoglobin 11.6 - 15.9 g/dL 10.4(L) 11.0(L) -  Hematocrit 34.8 - 46.6 % 33.5(L) 35.6 34.3  Platelets 145 - 400 10e3/uL 130(L) 130(L) -   . CBC    Component Value Date/Time   WBC 10.1 07/25/2016 1033   WBC 10.0 01/01/2016 1424   RBC 4.10 07/25/2016 1033   RBC 4.44 01/01/2016 1424   HGB 10.4 (L) 07/25/2016 1033   HCT 33.5 (L) 07/25/2016 1033   PLT 130 (L) 07/25/2016 1033   MCV 81.7 07/25/2016 1033   MCH 25.4 07/25/2016 1033   MCH  25.5 (L) 01/01/2016 1424   MCHC 31.0 (L) 07/25/2016 1033   MCHC 31.7 01/01/2016 1424   RDW 17.7 (H) 07/25/2016 1033   LYMPHSABS 6.8 (H) 07/25/2016 1033   MONOABS 0.8 07/25/2016 1033   EOSABS 0.2 07/25/2016 1033   BASOSABS 0.0 07/25/2016 1033    . CMP Latest Ref Rng & Units 07/01/2016 01/01/2016 06/20/2015  Glucose 70 - 140 mg/dl 86 88 95  BUN 7.0 - 26.0 mg/dL 12.3 16 <5(L)  Creatinine 0.6 - 1.1 mg/dL 0.9 0.95 0.83  Sodium 136 - 145 mEq/L 143 138 143  Potassium 3.5 - 5.1 mEq/L 3.3(L) 3.7 4.5  Chloride 101 - 111 mmol/L - 109 111  CO2 22 - 29 mEq/L 25 23 26   Calcium 8.4 - 10.4 mg/dL 10.2 9.8 9.9  Total Protein 6.4 - 8.3 g/dL 7.9 7.4 7.2  Total Bilirubin 0.20 - 1.20 mg/dL 0.97 0.7 0.8  Alkaline Phos 40 - 150 U/L 67 60 55  AST 5 - 34 U/L 18 25 21   ALT 0 - 55 U/L 13 23 14     Component     Latest Ref Rng & Units 07/01/2016  Iron     41 - 142 ug/dL 35 (L)  TIBC     236 - 444 ug/dL 311  UIBC     120 - 384 ug/dL 276  %SAT     21 - 57 % 11 (L)  T3 Uptake Ratio     24 - 39 % 26  Free Thyroxine Index     1.2 - 4.9 2.1  LDH     125 - 245 U/L 221  Vitamin B12     232 - 1,245 pg/mL 295  Ferritin     9 - 269 ng/ml 74  Thyroxine (T4)     4.5 - 12.0 ug/dL 8.1   T4,Free(Direct)     0.82 - 1.77 ng/dL 1.18  TSH     0.308 - 3.960 m(IU)/L 1.438   RADIOGRAPHIC STUDIES: I have personally reviewed the radiological images as listed and agreed with the findings in the report.  Nm Pet Image Initial (pi) Skull Base To Thigh  Result Date: 08/18/2016 CLINICAL DATA:  Initial treatment strategy for non-Hodgkin' s B-cell lymphoma. EXAM: NUCLEAR MEDICINE PET SKULL BASE TO THIGH TECHNIQUE: 6.1 MCi F-18 FDG was injected intravenously. Full-ring PET imaging was performed from the skull base to thigh after the radiotracer. CT data was obtained and used for attenuation correction and anatomic localization. FASTING BLOOD GLUCOSE:  Value: 88 mg/dl COMPARISON:  CT examinations from 01/01/2016 and 06/20/2015 FINDINGS: NECK A right station 2 lymph node measuring 0.8 cm in short axis on image 30/4 has a maximum standard uptake value of 3.2. Chronic encephalomalacia in the left occipital lobe. CHEST A left internal mammary lymph node measuring 0.7 cm in short axis on image 69/4 has a maximum SUV of 3.9. Small bilateral axillary lymph nodes are present, a right axillary node measuring 0.6 cm in short axis on image 56/4 has a maximum SUV of 2.3. Background blood pool activity SUV is 2.5. A pericardial lymph node measuring 0.9 cm in short axis on image 94/4 has a maximum SUV of 3.7. Biapical pleuroparenchymal scarring. Stable 3 mm left upper lobe nodule on image 26/8. Stable 3 mm right lower lobe nodule on image 53/8. Coronary, aortic arch, and branch vessel atherosclerotic vascular disease. ABDOMEN/PELVIS Faint diffuse accentuated metabolic activity in the spleen with the spleen measuring 12.2 by 4.6 by 9.4 cm (volume = 280 cm^3).  Scar-like appearance along the right hepatic lobe posteriorly with underlying hypodensity and retracted margin but no underlying I for metabolic activity. Additional hypodense lesions in the liver are not hypermetabolic. Background hepatic metabolic activity 3.5.  Indistinct density in the aortocaval region could be from transverse duodenum or a small lymph node, maximum SUV 5.4. Lymph node favored. A gastrohepatic lymph node measuring 1.3 cm in short axis on image 96/4 has a maximum SUV of 3.6. Bilateral hypermetabolic inguinal adenopathy. Left inguinal lymph node measuring 2.0 cm in short axis has maximum SUV 5.1. Mild soft tissue density prominence along the labia majora, maximum SUV 3.6. Incidental pancreas divisum. SKELETON No focal hypermetabolic activity to suggest skeletal metastasis. Bony demineralization noted. Mild thoracic kyphosis. Chronic degenerative anterolisthesis at L4-5 not appreciably changed from prior CT exam. IMPRESSION: 1. Scattered primarily mild adenopathy most notable in the bilateral inguinal regions, but also with involvement of the left internal mammary chain, left axilla, retroperitoneum, gastrohepatic ligament, and of a pericardial lymph node. Questionable involvement of a small right station 2 lymph node and at the right axilla. Much of this is Deauville 4 disease, with some Deauville 3 and of L2 disease as noted above. 2. Faintly accentuated activity in the spleen without focal activity or overt splenomegaly. This may merit surveillance. 3. There is some Deauville 3 level activity associated with mild soft tissue prominence of the labia majora. This could be simply incidental. 4. Other imaging findings of potential clinical significance: Aortic Atherosclerosis (ICD10-I70.0). Coronary atherosclerosis. Chronic left occipital lobe encephalomalacia. Hepatic cysts and potential scarring in the right hepatic lobe. Pancreas divisum. Chronic degenerative anterolisthesis at L4-5. Bony demineralization. Electronically Signed   By: Van Clines M.D.   On: 08/18/2016 13:09   ASSESSMENT & PLAN:   83 year old female with multiple medical comorbidities with  #1 Newly noted leukocytosis with increasing lymphocytosis. Flowcytometry suggestive fo  Cd20+ CD5-CD 10 neg Low grade Non Hodgkins lymphoma. Her WBC count is 13.1k with 9.4k lymphocytes which are up from 6.6k lymphocytes about 2 months ago. Concern for possible chronic lymphocytic leukemia versus leukemic phase non-Hodgkin's lymphoma versus less likely reactive lymphocytosis. Patient did have some borderline abdominal and inguinal lymphadenopathy on her CT abdomen pelvis in November 2017. PET/CT 6/25 showed Scattered primarily mild adenopathy most notable in the bilateral inguinal regions, but also with involvement of the left internal mammary chain, left axilla, retroperitoneum, gastrohepatic ligament, and of a pericardial lymph node. Questionable involvement of a small right station 2 lymph node and at the right axilla. Much of this is Deauville 4 disease, with some Deauville 3 and of L2 disease as noted above. 2. Faintly accentuated activity in the spleen without focal activity or overt splenomegaly.  CLL FISH Prognostic panel -- did not show any mutation typical for CLL. Less likely CD5 neg CLL Plan -PET/CT and other workup results reviewed in details . -US guided Bx of inguinal LN appears to be the least invasive method to try to obtain tissue diagnosis. -Mx per bx results  #2 anemia - microcytic anemia with some element to iron deficiency. Ferritin level of 74 with an iron saturation of 11%. #3 B12 deficiency. B12 levels are coming up from 116 now up to 295 with oral B12 replacement. No evidence of pernicious anemia based on neg antibody testing. Component     Latest Ref Rng & Units 07/01/2016  Vitamin B12     232 - 1,245 pg/mL 295  Intrinsic Factor Abs, Serum     0.0 - 1.1 AU/mL  0.9  Parietal Cell Ab     0.0 - 20.0 Units 5.7   Plan -continue liq B12  sublingual preparation 1027mcg daily and  given a prescription to start taking after she runs out of her oral B12. -Maintain B12 levels above 500. -Continue oral iron replacement as per primary care physician. Would  recommend iron polysaccharide 150 mg orally daily to maintain ferritin levels more than 100 and iron saturation of more than 20%.   #4 mild thrombocytopenic- could be related to her NHL -Will continue to monitor.  US guided Lymphnode biopsy in 1-2 weeks RTC with Dr Irene Limbo 1 week after biopsy in week after PAL with labs   All of the patients were answered with apparent satisfaction. The patient knows to call the clinic with any problems, questions or concerns.  I spent 20 minutes counseling the patient face to face. The total time spent in the appointment was 25 minutes and more than 50% was on counseling and direct patient cares.    Sullivan Lone MD [place] AAHIVMS [facility] Hematology/Oncology Physician [facility]  (Office):       [phone] (Work cell):  [phone] (Fax):           [phone]

## 2016-09-10 DIAGNOSIS — Z86711 Personal history of pulmonary embolism: Secondary | ICD-10-CM | POA: Diagnosis not present

## 2016-09-10 DIAGNOSIS — Z7901 Long term (current) use of anticoagulants: Secondary | ICD-10-CM | POA: Diagnosis not present

## 2016-09-18 ENCOUNTER — Other Ambulatory Visit (HOSPITAL_BASED_OUTPATIENT_CLINIC_OR_DEPARTMENT_OTHER): Payer: Medicare HMO

## 2016-09-18 ENCOUNTER — Telehealth: Payer: Self-pay | Admitting: Hematology

## 2016-09-18 ENCOUNTER — Ambulatory Visit (HOSPITAL_BASED_OUTPATIENT_CLINIC_OR_DEPARTMENT_OTHER): Payer: Medicare HMO | Admitting: Hematology

## 2016-09-18 VITALS — BP 174/92 | HR 60 | Temp 98.5°F | Resp 18 | Ht 65.0 in | Wt 124.5 lb

## 2016-09-18 DIAGNOSIS — D649 Anemia, unspecified: Secondary | ICD-10-CM

## 2016-09-18 DIAGNOSIS — E538 Deficiency of other specified B group vitamins: Secondary | ICD-10-CM

## 2016-09-18 DIAGNOSIS — C8308 Small cell B-cell lymphoma, lymph nodes of multiple sites: Secondary | ICD-10-CM

## 2016-09-18 DIAGNOSIS — C8588 Other specified types of non-Hodgkin lymphoma, lymph nodes of multiple sites: Secondary | ICD-10-CM

## 2016-09-18 DIAGNOSIS — D696 Thrombocytopenia, unspecified: Secondary | ICD-10-CM

## 2016-09-18 DIAGNOSIS — D509 Iron deficiency anemia, unspecified: Secondary | ICD-10-CM

## 2016-09-18 LAB — COMPREHENSIVE METABOLIC PANEL
ALT: 12 U/L (ref 0–55)
ANION GAP: 7 meq/L (ref 3–11)
AST: 16 U/L (ref 5–34)
Albumin: 3.3 g/dL — ABNORMAL LOW (ref 3.5–5.0)
Alkaline Phosphatase: 65 U/L (ref 40–150)
BUN: 11.8 mg/dL (ref 7.0–26.0)
CO2: 26 meq/L (ref 22–29)
Calcium: 10 mg/dL (ref 8.4–10.4)
Chloride: 109 mEq/L (ref 98–109)
Creatinine: 0.9 mg/dL (ref 0.6–1.1)
EGFR: 66 mL/min/{1.73_m2} — AB (ref >90)
GLUCOSE: 79 mg/dL (ref 70–140)
Potassium: 4.1 mEq/L (ref 3.5–5.1)
SODIUM: 141 meq/L (ref 136–145)
Total Bilirubin: 0.44 mg/dL (ref 0.20–1.20)
Total Protein: 7.3 g/dL (ref 6.4–8.3)

## 2016-09-18 LAB — LACTATE DEHYDROGENASE: LDH: 209 U/L (ref 125–245)

## 2016-09-18 LAB — CBC & DIFF AND RETIC
BASO%: 0.2 % (ref 0.0–2.0)
Basophils Absolute: 0 10*3/uL (ref 0.0–0.1)
EOS%: 1.7 % (ref 0.0–7.0)
Eosinophils Absolute: 0.2 10*3/uL (ref 0.0–0.5)
HCT: 33.4 % — ABNORMAL LOW (ref 34.8–46.6)
HGB: 10.3 g/dL — ABNORMAL LOW (ref 11.6–15.9)
IMMATURE RETIC FRACT: 7.9 % (ref 1.60–10.00)
LYMPH#: 7.4 10*3/uL — AB (ref 0.9–3.3)
LYMPH%: 74 % — AB (ref 14.0–49.7)
MCH: 25 pg — ABNORMAL LOW (ref 25.1–34.0)
MCHC: 30.8 g/dL — AB (ref 31.5–36.0)
MCV: 81.1 fL (ref 79.5–101.0)
MONO#: 0.3 10*3/uL (ref 0.1–0.9)
MONO%: 3.3 % (ref 0.0–14.0)
NEUT%: 20.8 % — AB (ref 38.4–76.8)
NEUTROS ABS: 2.1 10*3/uL (ref 1.5–6.5)
PLATELETS: 127 10*3/uL — AB (ref 145–400)
RBC: 4.12 10*6/uL (ref 3.70–5.45)
RDW: 17 % — ABNORMAL HIGH (ref 11.2–14.5)
RETIC CT ABS: 42.02 10*3/uL (ref 33.70–90.70)
Retic %: 1.02 % (ref 0.70–2.10)
WBC: 10 10*3/uL (ref 3.9–10.3)

## 2016-09-18 LAB — TECHNOLOGIST REVIEW

## 2016-09-18 LAB — FERRITIN: Ferritin: 50 ng/ml (ref 9–269)

## 2016-09-18 NOTE — Telephone Encounter (Signed)
Gave patient avs report and appointments for August. [facility] radiology will call patient re bx.

## 2016-09-19 LAB — VITAMIN B12: VITAMIN B 12: 616 pg/mL (ref 232–1245)

## 2016-09-22 ENCOUNTER — Telehealth: Payer: Self-pay

## 2016-09-22 NOTE — Telephone Encounter (Signed)
Dr. Pascal Lux returning page. Okay to continue pt on coumadin. Noting slightly elevated r/o increased bleeding with ultrasound guided biopsy. Dr. Irene Limbo aware that procedure will be done peripherally and benefit outweighs r/o weaning pt off coumadin. Pt to not take dose morning of procedure 8/6.

## 2016-09-25 ENCOUNTER — Other Ambulatory Visit: Payer: Self-pay | Admitting: Radiology

## 2016-09-26 ENCOUNTER — Other Ambulatory Visit: Payer: Self-pay | Admitting: Radiology

## 2016-09-29 ENCOUNTER — Ambulatory Visit (HOSPITAL_COMMUNITY)
Admission: RE | Admit: 2016-09-29 | Discharge: 2016-09-29 | Disposition: A | Discharge status: Home / Self Care | Payer: Medicare HMO | Source: Ambulatory Visit | Attending: Hematology | Admitting: Hematology

## 2016-09-29 ENCOUNTER — Encounter (HOSPITAL_COMMUNITY): Payer: Self-pay

## 2016-09-29 DIAGNOSIS — C8308 Small cell B-cell lymphoma, lymph nodes of multiple sites: Secondary | ICD-10-CM | POA: Insufficient documentation

## 2016-09-29 DIAGNOSIS — Z90710 Acquired absence of both cervix and uterus: Secondary | ICD-10-CM | POA: Insufficient documentation

## 2016-09-29 DIAGNOSIS — D649 Anemia, unspecified: Secondary | ICD-10-CM | POA: Diagnosis not present

## 2016-09-29 DIAGNOSIS — Z8249 Family history of ischemic heart disease and other diseases of the circulatory system: Secondary | ICD-10-CM | POA: Insufficient documentation

## 2016-09-29 DIAGNOSIS — Z833 Family history of diabetes mellitus: Secondary | ICD-10-CM | POA: Diagnosis not present

## 2016-09-29 DIAGNOSIS — D72820 Lymphocytosis (symptomatic): Secondary | ICD-10-CM | POA: Insufficient documentation

## 2016-09-29 DIAGNOSIS — Z8572 Personal history of non-Hodgkin lymphomas: Secondary | ICD-10-CM | POA: Diagnosis not present

## 2016-09-29 DIAGNOSIS — Z86711 Personal history of pulmonary embolism: Secondary | ICD-10-CM | POA: Diagnosis not present

## 2016-09-29 DIAGNOSIS — I10 Essential (primary) hypertension: Secondary | ICD-10-CM | POA: Diagnosis not present

## 2016-09-29 DIAGNOSIS — E785 Hyperlipidemia, unspecified: Secondary | ICD-10-CM | POA: Diagnosis not present

## 2016-09-29 DIAGNOSIS — Z8673 Personal history of transient ischemic attack (TIA), and cerebral infarction without residual deficits: Secondary | ICD-10-CM | POA: Insufficient documentation

## 2016-09-29 DIAGNOSIS — I4891 Unspecified atrial fibrillation: Secondary | ICD-10-CM | POA: Insufficient documentation

## 2016-09-29 DIAGNOSIS — Z87891 Personal history of nicotine dependence: Secondary | ICD-10-CM | POA: Insufficient documentation

## 2016-09-29 DIAGNOSIS — M109 Gout, unspecified: Secondary | ICD-10-CM | POA: Insufficient documentation

## 2016-09-29 DIAGNOSIS — K5732 Diverticulitis of large intestine without perforation or abscess without bleeding: Secondary | ICD-10-CM | POA: Insufficient documentation

## 2016-09-29 DIAGNOSIS — D696 Thrombocytopenia, unspecified: Secondary | ICD-10-CM | POA: Diagnosis not present

## 2016-09-29 DIAGNOSIS — Z9889 Other specified postprocedural states: Secondary | ICD-10-CM | POA: Diagnosis not present

## 2016-09-29 DIAGNOSIS — Z79899 Other long term (current) drug therapy: Secondary | ICD-10-CM | POA: Diagnosis not present

## 2016-09-29 DIAGNOSIS — Z7901 Long term (current) use of anticoagulants: Secondary | ICD-10-CM | POA: Insufficient documentation

## 2016-09-29 DIAGNOSIS — Z8541 Personal history of malignant neoplasm of cervix uteri: Secondary | ICD-10-CM | POA: Insufficient documentation

## 2016-09-29 DIAGNOSIS — K219 Gastro-esophageal reflux disease without esophagitis: Secondary | ICD-10-CM | POA: Insufficient documentation

## 2016-09-29 LAB — PROTIME-INR
INR: 1.47
Prothrombin Time: 18 seconds — ABNORMAL HIGH (ref 11.4–15.2)

## 2016-09-29 LAB — CBC WITH DIFFERENTIAL/PLATELET
BASOS ABS: 0 10*3/uL (ref 0.0–0.1)
BASOS PCT: 0 %
Eosinophils Absolute: 0.2 10*3/uL (ref 0.0–0.7)
Eosinophils Relative: 2 %
HCT: 34.9 % — ABNORMAL LOW (ref 36.0–46.0)
HEMOGLOBIN: 10.9 g/dL — AB (ref 12.0–15.0)
LYMPHS PCT: 73 %
Lymphs Abs: 8.2 10*3/uL — ABNORMAL HIGH (ref 0.7–4.0)
MCH: 24.7 pg — ABNORMAL LOW (ref 26.0–34.0)
MCHC: 31.2 g/dL (ref 30.0–36.0)
MCV: 79 fL (ref 78.0–100.0)
MONOS PCT: 4 %
Monocytes Absolute: 0.4 10*3/uL (ref 0.1–1.0)
NEUTROS ABS: 2.3 10*3/uL (ref 1.7–7.7)
Neutrophils Relative %: 21 %
PLATELETS: 146 10*3/uL — AB (ref 150–400)
RBC: 4.42 MIL/uL (ref 3.87–5.11)
RDW: 17 % — ABNORMAL HIGH (ref 11.5–15.5)
WBC: 11.1 10*3/uL — ABNORMAL HIGH (ref 4.0–10.5)

## 2016-09-29 MED ORDER — SODIUM CHLORIDE 0.9 % IV SOLN
INTRAVENOUS | Status: DC
  Administered 2016-09-29: 11:00:00 via INTRAVENOUS

## 2016-09-29 MED ORDER — MIDAZOLAM HCL 2 MG/2ML IJ SOLN
INTRAMUSCULAR | Status: AC | PRN
  Administered 2016-09-29: 1 mg via INTRAVENOUS

## 2016-09-29 MED ORDER — FENTANYL CITRATE (PF) 100 MCG/2ML IJ SOLN
INTRAMUSCULAR | Status: AC
  Filled 2016-09-29: qty 2

## 2016-09-29 MED ORDER — MIDAZOLAM HCL 2 MG/2ML IJ SOLN
INTRAMUSCULAR | Status: DC
Start: 2016-09-29 — End: 2016-09-30
  Filled 2016-09-29: qty 2

## 2016-09-29 MED ORDER — FENTANYL CITRATE (PF) 100 MCG/2ML IJ SOLN
INTRAMUSCULAR | Status: AC | PRN
  Administered 2016-09-29: 50 ug via INTRAVENOUS

## 2016-09-29 NOTE — Procedures (Signed)
Pre Procedure Dx: Inguinal lymphadenopathy Post Procedural Dx: Same  Technically successful US guided biopsy of dominant left inguinal LN.  EBL: Minimal  No immediate complications.   Ronny Bacon, MD Pager #: [phone]

## 2016-09-29 NOTE — H&P (Signed)
Chief Complaint: Patient was seen in consultation today for lymphadenopathy  Referring Physician(s): Brunetta Genera  Supervising Physician: Sandi Mariscal  Patient Status: [facility] - Out-pt  History of Present Illness: Sara Butler is a 83 y.o. female with past medical history of a fib, CVA, diverticulitis, GERD, HLD, HTN, PE and DVT on chronic Coumadin who presents with complaint of lymphocytosis, anemia, and thrombocytopenia.  Flow cytometry suggestive of non-Hodgkin's lymphoma per Dr. Irene Limbo progress note 07/25/16.  PET 08/18/16 showed: 1. Scattered primarily mild adenopathy most notable in the bilateral inguinal regions, but also with involvement of the left internal mammary chain, left axilla, retroperitoneum, gastrohepatic ligament, and of a pericardial lymph node. Questionable involvement of a small right station 2 lymph node and at the right axilla. Much of this is Deauville 4 disease, with some Deauville 3 and of L2 disease as noted above. 2. Faintly accentuated activity in the spleen without focal activity or overt splenomegaly. This may merit surveillance. 3. There is some Deauville 3 level activity associated with mild soft tissue prominence of the labia majora. This could be simply incidental.  IR consulted for inguinal lymph node biopsy at the request of Dr. Irene Limbo.  Case reviewed by Dr. Ronny Bacon who feels patient is appropriate for procedure and may continue her coumadin without interruption.   Patient presents for procedure today in her usual state of health.  She has been NPO.  She last took Coumadin yesterday morning.  Past Medical History:  Diagnosis Date  . Atrial fibrillation ([place])   . Bleeding from anus   . Cervical cancer ([place])   . Diverticulitis of colon   . GERD (gastroesophageal reflux disease)   . Gout   . Hyperlipidemia   . Hypertension   . Pulmonary embolism [facility])     Past Surgical History:  Procedure Laterality Date  . ABDOMINAL HYSTERECTOMY    .  BACK SURGERY      Allergies: Patient has no known allergies.  Medications: Prior to Admission medications   Medication Sig Start Date End Date Taking? Authorizing Provider  acetaminophen (TYLENOL) 500 MG tablet Take 500 mg by mouth every 6 (six) hours as needed for headache.    [provider]  amLODipine (NORVASC) 2.5 MG tablet Take 2.5 mg by mouth daily.    [provider]  atenolol (TENORMIN) 25 MG tablet Take by mouth daily.    [provider]  Cyanocobalamin (B-12) 1000 MCG SUBL Place 1,000 mcg under the tongue daily. 07/01/16   Brunetta Genera, MD  diclofenac sodium (VOLTAREN) 1 % GEL Apply 2 g topically daily as needed. For pain on legs    [provider]  iron polysaccharides (NIFEREX) 150 MG capsule Take 1 capsule (150 mg total) by mouth daily. 07/25/16   Brunetta Genera, MD  mirabegron ER (MYRBETRIQ) 50 MG TB24 tablet Take 50 mg by mouth daily.    [provider]  omeprazole (PRILOSEC) 20 MG capsule Take 20 mg by mouth as needed.     [provider]  warfarin (COUMADIN) 5 MG tablet Take 5-7.5 mg by mouth daily. On Monday, Wednesday, and Friday take 1 tab (5mg ) All other days take 1 1/2 tablet (7.5mg )    [provider]     Family History  Problem Relation Age of Onset  . Heart failure Mother   . Diabetes Brother   . Cancer Neg Hx     Social History   Social History  . Marital status: Widowed  Spouse name: N/A  . Number of children: 4  . Years of education: 12   Occupational History  . 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 Smoking status: Former Smoker    Packs/day: 0.10    Years: 58.00    Types: Cigarettes  . Smokeless tobacco: Never Used  . Alcohol use No  . Drug use: No  . Sexual activity: No   Other Topics Concern  . Not on file   Social History Narrative   Widowed. Lives alone.  Normally independent of ADLs and ambulation.     Review of Systems    Constitutional: Negative for fatigue and fever.  Respiratory: Negative for cough and shortness of breath.   Cardiovascular: Negative for chest pain.  Musculoskeletal: Negative for back pain.  Psychiatric/Behavioral: Negative for behavioral problems and confusion.    Vital Signs: BP (!) 179/89   Pulse 67   Temp 98.2 F (36.8 C) (Oral)   Resp 16   Ht 5\' 5"  (1.651 m)   Wt 119 lb 9.6 oz (54.3 kg)   SpO2 99%   BMI 19.90 kg/m   Physical Exam  Constitutional: She is oriented to person, place, and time. She appears well-developed.  Cardiovascular: Normal rate, regular rhythm and normal heart sounds.   Pulmonary/Chest: Effort normal and breath sounds normal. No respiratory distress.  Neurological: She is alert and oriented to person, place, and time.  Skin: Skin is warm and dry.  Psychiatric: She has a normal mood and affect. Her behavior is normal. Judgment and thought content normal.  Nursing note and vitals reviewed.   Mallampati Score:  MD Evaluation Airway: WNL Heart: WNL Abdomen: WNL Chest/ Lungs: WNL ASA  Classification: 3 Mallampati/Airway Score: Two  Imaging: No results found.  Labs:  CBC:  Recent Labs  01/01/16 1424 07/01/16 1246 07/01/16 1246 07/25/16 1033 09/18/16 1141  WBC 10.0 13.1*  --  10.1 10.0  HGB 11.3* 11.0*  --  10.4* 10.3*  HCT 35.7* 35.6 34.3 33.5* 33.4*  PLT 118* 130*  --  130* 127*    COAGS: No results for input(s): INR, APTT in the last 8760 hours.  BMP:  Recent Labs  01/01/16 1424 07/01/16 1246 09/18/16 1141  NA 138 143 141  K 3.7 3.3* 4.1  CL 109  --   --   CO2 23 25 26   GLUCOSE 88 86 79  BUN 16 12.3 11.8  CALCIUM 9.8 10.2 10.0  CREATININE 0.95 0.9 0.9  GFRNONAA 53*  --   --   GFRAA >60  --   --     LIVER FUNCTION TESTS:  Recent Labs  01/01/16 1424 07/01/16 1246 09/18/16 1141  BILITOT 0.7 0.97 0.44  AST 25 18 16   ALT 23 13 12   ALKPHOS 60 67 65  PROT 7.4 7.9 7.3  ALBUMIN 3.3* 3.7 3.3*    TUMOR  MARKERS: No results for input(s): AFPTM, CEA, CA199, CHROMGRNA in the last 8760 hours.  Assessment and Plan: Patient with past medical history of CVA, PE, DVT, and a fib presents with complaint of thrombocytopenia, anemia, and lymphocytosis.  IR consulted for lymph node biopsy at the request of Dr. Irene Limbo. Case reviewed by Dr. Pascal Lux who approves patient for procedure.  Patient presents today in their usual state of health.  She has been NPO and is currently on blood thinners.  Risks and benefits discussed with the patient including, but not limited to bleeding, infection, damage to adjacent structures  or low yield requiring additional tests. All of the patient's questions were answered, patient is agreeable to proceed. Consent signed and in chart.   Thank you for this interesting consult.  I greatly enjoyed meeting CHERRIL HETT and look forward to participating in their care.  A copy of this report was sent to the requesting provider on this date.  Electronically Signed: Docia Barrier, PA 09/29/2016, 11:29 AM   I spent a total of  30 Minutes   in face to face in clinical consultation, greater than 50% of which was counseling/coordinating care for lymphadenopathy.

## 2016-09-29 NOTE — Discharge Instructions (Signed)
°Needle Biopsy of the Bone, Care After °Refer to this sheet in the next few weeks. These instructions provide you with information about caring for yourself after your procedure. Your health care provider may also give you more specific instructions. Your treatment has been planned according to current medical practices, but problems sometimes occur. Call your health care provider if you have any problems or questions after your procedure. °What can I expect after the procedure? °After your procedure, it is common to have soreness or tenderness at the puncture site. °Follow these instructions at home: °· Take over-the-counter and prescription medicines only as told by your health care provider. °· Bathe and shower as told by your health care provider. °· Follow instructions from your health care provider about: °? How to take care of your puncture site. °? When and how you should change your bandage (dressing). °? When you should remove your dressing. °· Check your puncture site every day for signs of infection. Watch for: °? Redness, swelling, or worsening pain. °? Fluid, blood, or pus. °· Return to your normal activities as told by your health care provider. °· Keep all follow-up visits as told by your health care provider. This is important. °Contact a health care provider if: °· You have redness, swelling, or worsening pain at the site of your puncture. °· You have fluid, blood, or pus coming from your puncture site. °· You have a fever. °· You have persistent nausea or vomiting. °Get help right away if: °· You develop a rash. °· You have difficulty breathing. °This information is not intended to replace advice given to you by your health care provider. Make sure you discuss any questions you have with your health care provider. °Document Released: 08/30/2004 Document Revised: 07/19/2015 Document Reviewed: 03/20/2014 °Elsevier Interactive Patient Education © 2018 [employer] °Moderate Conscious Sedation,  Adult, Care After °These instructions provide you with information about caring for yourself after your procedure. Your health care provider may also give you more specific instructions. Your treatment has been planned according to current medical practices, but problems sometimes occur. Call your health care provider if you have any problems or questions after your procedure. °What can I expect after the procedure? °After your procedure, it is common: °· To feel sleepy for several hours. °· To feel clumsy and have poor balance for several hours. °· To have poor judgment for several hours. °· To vomit if you eat too soon. ° °Follow these instructions at home: °For at least 24 hours after the procedure: ° °· Do not: °? Participate in activities where you could fall or become injured. °? Drive. °? Use heavy machinery. °? Drink alcohol. °? Take sleeping pills or medicines that cause drowsiness. °? Make important decisions or sign legal documents. °? Take care of children on your own. °· Rest. °Eating and drinking °· Follow the diet recommended by your health care provider. °· If you vomit: °? Drink water, juice, or soup when you can drink without vomiting. °? Make sure you have little or no nausea before eating solid foods. °General instructions °· Have a responsible adult stay with you until you are awake and alert. °· Take over-the-counter and prescription medicines only as told by your health care provider. °· If you smoke, do not smoke without supervision. °· Keep all follow-up visits as told by your health care provider. This is important. °Contact a health care provider if: °· You keep feeling nauseous or you keep vomiting. °· You feel light-headed. °·   You develop a rash. °· You have a fever. °Get help right away if: °· You have trouble breathing. °This information is not intended to replace advice given to you by your health care provider. Make sure you discuss any questions you have with your health care  provider. °Document Released: 12/01/2012 Document Revised: 07/16/2015 Document Reviewed: 06/02/2015 °Elsevier Interactive Patient Education © 2018 [employer] Inc. ° °

## 2016-10-01 DIAGNOSIS — Z7901 Long term (current) use of anticoagulants: Secondary | ICD-10-CM | POA: Diagnosis not present

## 2016-10-01 DIAGNOSIS — Z86711 Personal history of pulmonary embolism: Secondary | ICD-10-CM | POA: Diagnosis not present

## 2016-10-02 ENCOUNTER — Telehealth: Payer: Self-pay | Admitting: Hematology

## 2016-10-02 ENCOUNTER — Ambulatory Visit (HOSPITAL_BASED_OUTPATIENT_CLINIC_OR_DEPARTMENT_OTHER): Payer: Medicare HMO | Admitting: Hematology

## 2016-10-02 ENCOUNTER — Encounter: Payer: Self-pay | Admitting: Hematology

## 2016-10-02 ENCOUNTER — Telehealth: Payer: Self-pay | Admitting: *Deleted

## 2016-10-02 VITALS — BP 157/70 | HR 60 | Temp 98.8°F | Resp 16 | Wt 122.1 lb

## 2016-10-02 DIAGNOSIS — C8308 Small cell B-cell lymphoma, lymph nodes of multiple sites: Secondary | ICD-10-CM

## 2016-10-02 DIAGNOSIS — D696 Thrombocytopenia, unspecified: Secondary | ICD-10-CM | POA: Diagnosis not present

## 2016-10-02 DIAGNOSIS — C8588 Other specified types of non-Hodgkin lymphoma, lymph nodes of multiple sites: Secondary | ICD-10-CM | POA: Diagnosis not present

## 2016-10-02 DIAGNOSIS — D538 Other specified nutritional anemias: Secondary | ICD-10-CM

## 2016-10-02 DIAGNOSIS — D519 Vitamin B12 deficiency anemia, unspecified: Secondary | ICD-10-CM

## 2016-10-02 DIAGNOSIS — D509 Iron deficiency anemia, unspecified: Secondary | ICD-10-CM

## 2016-10-02 DIAGNOSIS — R634 Abnormal weight loss: Secondary | ICD-10-CM

## 2016-10-02 DIAGNOSIS — D500 Iron deficiency anemia secondary to blood loss (chronic): Secondary | ICD-10-CM

## 2016-10-02 NOTE — Telephone Encounter (Signed)
"  This is Amy from the [facility] at [facility].  Could you let Dr.Kale know to re-submit the referral order for this patient.  His dietician referral fell in our work que for this patient.  She lives in [place].  This is not something we do here if she was a resident."

## 2016-10-02 NOTE — Telephone Encounter (Signed)
Gave patient avs and calendar for upcoming appointments.  °

## 2016-10-02 NOTE — Progress Notes (Signed)
[facility]    HEMATOLOGY/ONCOLOGY CLINIC NOTE  Date of Service:.09/18/2016  Patient Care Team: Carol Ada, MD as PCP - General (Family Medicine)  CHIEF COMPLAINTS/PURPOSE OF CONSULTATION:  Lymphocytosis Iron deficiency anemia History of B12 deficiency  HISTORY OF PRESENTING ILLNESS:   Sara Butler is a wonderful 83 y.o. female who has been referred to [place] by Dr .Carol Ada, MD  for evaluation and management of increasing lymphocytosis, anemia and thrombocytopenia.  Patient has a history of hypertension, dyslipidemia, paroxysmal atrial fibrillation on Coumadin, previous history of pulmonary embolism in 2013, history of iron deficiency due to chronic GI losses on oral iron therapy, GERD.  Patient had recent labs with her primary care physician on 05/01/2016 which showed total WBC count of 10.2k with 6.6k lymphocytes, mild anemia with hemoglobin of 11.4 and somewhat microcytic in dialysis with an MCV of 80 and an RDW of 19. She was also noted to have mild thrombocytopenia with a platelet count of 143k.  Patient had workup including getting TSH which is within normal limits. Noted to have significant B12 deficiency with a B12 level of 116. Has been started on oral B12 5000 g daily by her primary care physician. Recommended considering sublingual B12 replacement. Would need to rule out pernicious anemia.   Patient notes that she has been on oral iron replacement on and off.  She reports issues with what is thought to be irritable bowel syndrome for which she is on Myrbetriq.  Patient notes that she had some abdominal discomfort and had a CT of the abdomen and pelvis on 01/01/2016 which showed Prominent bilateral inguinal as well as gastrohepatic lymphadenopathy. These are new from the prior exam of 2014 in the gastrohepatic changes appear to be new from the recent exam from 06/20/2015.  Patient notes no fevers no chills no night sweats no significant unexpected weight loss. She denies any  overt GI bleeding. No other issues with overt nosebleeds gum bleeds or other sources of bleeding.  INTERVAL HISTORY  Patient is here to f/u on her  CD20+ CD5 and Cd10 neg lymphoproliferative disorder likely a low grade NHL. She was to get a ultrasound-guided core needle biopsy however this was not done due to anticoagulation issues.  No other acute new symptoms.   MEDICAL HISTORY:  Past Medical History:  Diagnosis Date  . Atrial fibrillation ([place])   . Bleeding from anus   . Cervical cancer ([place])   . Diverticulitis of colon   . GERD (gastroesophageal reflux disease)   . Gout   . Hyperlipidemia   . Hypertension   . Pulmonary embolism ([facility])   Iron deficiency anemia due to chronic blood loss Paroxysmal atrial fibrillation on Coumadin Diverticulosis of the large and this time Vitamin D deficiency Previous history of pulmonary embolism in 2013 Overactive bladder Osteoarthritis of the knee Chronic back pain History of previous GI bleed in 2011 (while on pradaxa)  SURGICAL HISTORY: Past Surgical History:  Procedure Laterality Date  . ABDOMINAL HYSTERECTOMY    . BACK SURGERY      SOCIAL HISTORY: Social History   Social History  . Marital status: Widowed    Spouse name: N/A  . Number of children: 4  . Years of education: 12   Occupational History  . Retired Museum/gallery curator at [employer]  . Smoking status: Former Smoker    Packs/day: 0.10    Years: 58.00    Types: Cigarettes  . Smokeless tobacco: Never Used  .  Alcohol use No  . Drug use: No  . Sexual activity: No   Other Topics Concern  . Not on file   Social History Narrative   Widowed. Lives alone.  Normally independent of ADLs and ambulation.    FAMILY HISTORY: Family History  Problem Relation Age of Onset  . Heart failure Mother   . Diabetes Brother   . Cancer Neg Hx     ALLERGIES:  has No Known Allergies.  MEDICATIONS:  Current Outpatient Prescriptions  Medication Sig  Dispense Refill  . acetaminophen (TYLENOL) 500 MG tablet Take 500 mg by mouth every 6 (six) hours as needed for headache.    [facility] atenolol (TENORMIN) 25 MG tablet Take by mouth daily.    . Cyanocobalamin (B-12) 1000 MCG SUBL Place 1,000 mcg under the tongue daily. 30 each 3  . diclofenac sodium (VOLTAREN) 1 % GEL Apply 2 g topically daily as needed. For pain on legs    . iron polysaccharides (NIFEREX) 150 MG capsule Take 1 capsule (150 mg total) by mouth daily. 30 capsule 2  . omeprazole (PRILOSEC) 20 MG capsule Take 20 mg by mouth as needed.     . warfarin (COUMADIN) 5 MG tablet Take 5-7.5 mg by mouth daily. On Monday, Wednesday, and Friday take 1 tab (5mg ) All other days take 1 1/2 tablet (7.5mg )     No current facility-administered medications for this visit.     REVIEW OF SYSTEMS:    10 Point review of Systems was done is negative except as noted above.  PHYSICAL EXAMINATION: ECOG PERFORMANCE STATUS: 2 - Symptomatic, <50% confined to bed  . Vitals:   09/18/16 1206  BP: (!) 174/92  Pulse: 60  Resp: 18  Temp: 98.5 F (36.9 C)  SpO2: 98%   Filed Weights   09/18/16 1206  Weight: 124 lb 8 oz (56.5 kg)   .Body mass index is 20.72 kg/m.  GENERAL:alert, in no acute distress and comfortable SKIN: no acute rashes, no significant lesions EYES: conjunctiva are pink and non-injected, sclera anicteric OROPHARYNX: MMM, no exudates, no oropharyngeal erythema or ulceration NECK: supple, no JVD LYMPH:  no palpable lymphadenopathy in the cervical, axillary regions. Very small borderline palpable inguinal lymph nodes. LUNGS: clear to auscultation b/l with normal respiratory effort HEART: Irregular rhythm ABDOMEN:  normoactive bowel sounds , non tender, not distended. Extremity: no pedal edema PSYCH: alert & oriented x 3 with fluent speech NEURO: no focal motor/sensory deficits  LABORATORY DATA:  I have reviewed the data as listed  . CBC Latest Ref Rng & Units 09/18/2016 07/25/2016    WBC 4.0 - 10.5 K/uL 10.0 10.1  Hemoglobin 12.0 - 15.0 g/dL 10.3(L) 10.4(L)  Hematocrit 36.0 - 46.0 % 33.4(L) 33.5(L)  Platelets 150 - 400 K/uL 127(L) 130(L)   . CBC    Component Value Date/Time   WBC 11.1 (H) 09/29/2016 1057   RBC 4.42 09/29/2016 1057   HGB 10.9 (L) 09/29/2016 1057   HGB 10.3 (L) 09/18/2016 1141   HCT 34.9 (L) 09/29/2016 1057   HCT 33.4 (L) 09/18/2016 1141   PLT 146 (L) 09/29/2016 1057   PLT 127 (L) 09/18/2016 1141   MCV 79.0 09/29/2016 1057   MCV 81.1 09/18/2016 1141   MCH 24.7 (L) 09/29/2016 1057   MCHC 31.2 09/29/2016 1057   RDW 17.0 (H) 09/29/2016 1057   RDW 17.0 (H) 09/18/2016 1141   LYMPHSABS 8.2 (H) 09/29/2016 1057   LYMPHSABS 7.4 (H) 09/18/2016 1141   MONOABS 0.4 09/29/2016 1057  MONOABS 0.3 09/18/2016 1141   EOSABS 0.2 09/29/2016 1057   EOSABS 0.2 09/18/2016 1141   BASOSABS 0.0 09/29/2016 1057   BASOSABS 0.0 09/18/2016 1141    . CMP Latest Ref Rng & Units 09/18/2016 07/01/2016 01/01/2016  Glucose 70 - 140 mg/dl 79 86 88  BUN 7.0 - 26.0 mg/dL 11.8 12.3 16  Creatinine 0.6 - 1.1 mg/dL 0.9 0.9 0.95  Sodium 136 - 145 mEq/L 141 143 138  Potassium 3.5 - 5.1 mEq/L 4.1 3.3(L) 3.7  Chloride 101 - 111 mmol/L - - 109  CO2 22 - 29 mEq/L 26 25 23   Calcium 8.4 - 10.4 mg/dL 10.0 10.2 9.8  Total Protein 6.4 - 8.3 g/dL 7.3 7.9 7.4  Total Bilirubin 0.20 - 1.20 mg/dL 0.44 0.97 0.7  Alkaline Phos 40 - 150 U/L 65 67 60  AST 5 - 34 U/L 16 18 25   ALT 0 - 55 U/L 12 13 23     Component     Latest Ref Rng & Units 07/01/2016  Iron     41 - 142 ug/dL 35 (L)  TIBC     236 - 444 ug/dL 311  UIBC     120 - 384 ug/dL 276  %SAT     21 - 57 % 11 (L)  T3 Uptake Ratio     24 - 39 % 26  Free Thyroxine Index     1.2 - 4.9 2.1  LDH     125 - 245 U/L 221  Vitamin B12     232 - 1,245 pg/mL 295  Ferritin     9 - 269 ng/ml 74  Thyroxine (T4)     4.5 - 12.0 ug/dL 8.1  T4,Free(Direct)     0.82 - 1.77 ng/dL 1.18  TSH     0.308 - 3.960 m(IU)/L 1.438   RADIOGRAPHIC  STUDIES: I have personally reviewed the radiological images as listed and agreed with the findings in the report.  PET/CT scan 08/18/2016 IMPRESSION: 1. Scattered primarily mild adenopathy most notable in the bilateral inguinal regions, but also with involvement of the left internal mammary chain, left axilla, retroperitoneum, gastrohepatic ligament, and of a pericardial lymph node. Questionable involvement of a small right station 2 lymph node and at the right axilla. Much of this is Deauville 4 disease, with some Deauville 3 and of L2 disease as noted above. 2. Faintly accentuated activity in the spleen without focal activity or overt splenomegaly. This may merit surveillance. 3. There is some Deauville 3 level activity associated with mild soft tissue prominence of the labia majora. This could be simply incidental. 4. Other imaging findings of potential clinical significance: Aortic Atherosclerosis (ICD10-I70.0). Coronary atherosclerosis. Chronic left occipital lobe encephalomalacia. Hepatic cysts and potential scarring in the right hepatic lobe. Pancreas divisum. Chronic degenerative anterolisthesis at L4-5. Bony demineralization.   Electronically Signed   By: Van Clines M.D.   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 Her WBC count is 13.1k with 9.4k lymphocytes which are up from 6.6k lymphocytes about 2 months ago. Concern for possible chronic lymphocytic leukemia versus leukemic phase non-Hodgkin's lymphoma   Patient did have some borderline abdominal and inguinal lymphadenopathy on her CT abdomen pelvis in November 2017. PET/CT 6/25 showed Scattered primarily mild adenopathy most notable in the bilateral inguinal regions, but  also with involvement of the left internal  mammary chain, left axilla, retroperitoneum, gastrohepatic ligament, and of a pericardial lymph node. Questionable involvement of a small right station 2 lymph node and at the right axilla. Much of this is Deauville 4 disease, with some Deauville 3 and of L2 disease as noted above. 2. Faintly accentuated activity in the spleen without focal activity or overt splenomegaly.  CLL FISH Prognostic panel -- did not show any mutation typical for CLL. Less likely CD5 neg CLL Plan -US guided Bx of inguinal LN was not done due to anti-coagulation issues. -We discussed with interventional radiology and they are agreeable to doing a needle biopsy without interrupting Coumadin. Patient has been scheduled for this and will see [place] back after her ultrasound-guided core needle inguinal lymph node biopsy.  #2 anemia - microcytic anemia with some element to iron deficiency. Ferritin level of 74 with an iron saturation of 11%. #3 B12 deficiency. B12 levels are coming up from 116 now up to 295 with oral B12 replacement. No evidence of pernicious anemia based on neg antibody testing. Component     Latest Ref Rng & Units 07/01/2016  Vitamin B12     232 - 1,245 pg/mL 295  Intrinsic Factor Abs, Serum     0.0 - 1.1 AU/mL 0.9  Parietal Cell Ab     0.0 - 20.0 Units 5.7   Plan -continue liq B12  sublingual preparation 1052mcg daily and  given a prescription to start taking after she runs out of her oral B12. -Maintain B12 levels above 500. -Continue oral iron replacement as per primary care physician. Would recommend iron polysaccharide 150 mg orally daily to maintain ferritin levels more than 100 and iron saturation of more than 20%.   #4 mild thrombocytopenic- could be related to her NHL -Will continue to monitor.  US guided inguinal LN biopsy in 1 week RTC with Dr Irene Limbo in 1 week after biopsy  All of the patients were answered with apparent satisfaction. The patient knows to call the clinic with any problems,  questions or concerns.  I spent 20 minutes counseling the patient face to face. The total time spent in the appointment was 20 minutes and more than 50% was on counseling and direct patient cares.    Sullivan Lone MD [place] AAHIVMS [facility] Hematology/Oncology Physician [facility]  (Office):       [phone] (Work cell):  [phone] (Fax):           [phone]

## 2016-10-02 NOTE — Progress Notes (Signed)
Marland Kitchen    HEMATOLOGY/ONCOLOGY CLINIC NOTE  Date of Service:.10/02/2016  Patient Care Team: Carol Ada, MD as PCP - General (Family Medicine)  CHIEF COMPLAINTS/PURPOSE OF CONSULTATION:  Lymphocytosis Iron deficiency anemia History of B12 deficiency  HISTORY OF PRESENTING ILLNESS:   Sara Butler is a wonderful 83 y.o. female who has been referred to [place] by Dr .Carol Ada, MD  for evaluation and management of increasing lymphocytosis, anemia and thrombocytopenia.  Patient has a history of hypertension, dyslipidemia, paroxysmal atrial fibrillation on Coumadin, previous history of pulmonary embolism in 2013, history of iron deficiency due to chronic GI losses on oral iron therapy, GERD.  Patient had recent labs with her primary care physician on 05/01/2016 which showed total WBC count of 10.2k with 6.6k lymphocytes, mild anemia with hemoglobin of 11.4 and somewhat microcytic in dialysis with an MCV of 80 and an RDW of 19. She was also noted to have mild thrombocytopenia with a platelet count of 143k.  Patient had workup including getting TSH which is within normal limits. Noted to have significant B12 deficiency with a B12 level of 116. Has been started on oral B12 5000 g daily by her primary care physician. Recommended considering sublingual B12 replacement. Would need to rule out pernicious anemia.   Patient notes that she has been on oral iron replacement on and off.  She reports issues with what is thought to be irritable bowel syndrome for which she is on Myrbetriq.  Patient notes that she had some abdominal discomfort and had a CT of the abdomen and pelvis on 01/01/2016 which showed Prominent bilateral inguinal as well as gastrohepatic lymphadenopathy. These are new from the prior exam of 2014 in the gastrohepatic changes appear to be new from the recent exam from 06/20/2015.  Patient notes no fevers no chills no night sweats no significant unexpected weight loss. She denies any  overt GI bleeding. No other issues with overt nosebleeds gum bleeds or other sources of bleeding.  INTERVAL HISTORY  Patient is here to f/u on her  CD20+ CD5 and Cd10 neg lymphoproliferative disorder low grade NHL NOS. She had a US guided core needle biopsy of right inguinal lymph node which was unfortunately nondiagnostic. She notes no acute new symptoms. Hemoglobin and platelets are improved since her last clinic visit. We discussed option for referral to surgery for excisional lymph node biopsy versus monitor her clinical status and labs at this time since she is relatively asymptomatic. She chooses the latter option and would like to just monitor things at this time. She would like to see the dietitian to optimize her oral intake since she notes she might be losing some weight.   MEDICAL HISTORY:  Past Medical History:  Diagnosis Date  . Atrial fibrillation ([place])   . Bleeding from anus   . Cervical cancer ([place])   . Diverticulitis of colon   . GERD (gastroesophageal reflux disease)   . Gout   . Hyperlipidemia   . Hypertension   . Pulmonary embolism ([facility])   Iron deficiency anemia due to chronic blood loss Paroxysmal atrial fibrillation on Coumadin Diverticulosis of the large and this time Vitamin D deficiency Previous history of pulmonary embolism in 2013 Overactive bladder Osteoarthritis of the knee Chronic back pain History of previous GI bleed in 2011 (while on pradaxa)  SURGICAL HISTORY: Past Surgical History:  Procedure Laterality Date  . ABDOMINAL HYSTERECTOMY    . BACK SURGERY      SOCIAL HISTORY: Social History   Social  History  . Marital status: Widowed    Spouse name: N/A  . Number of children: 4  . Years of education: 12   Occupational History  . 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 Smoking status: Former Smoker    Packs/day: 0.10    Years: 58.00    Types: Cigarettes  . Smokeless tobacco: Never Used  . Alcohol use No  .  Drug use: No  . Sexual activity: No   Other Topics Concern  . Not on file   Social History Narrative   Widowed. Lives alone.  Normally independent of ADLs and ambulation.    FAMILY HISTORY: Family History  Problem Relation Age of Onset  . Heart failure Mother   . Diabetes Brother   . Cancer Neg Hx     ALLERGIES:  has No Known Allergies.  MEDICATIONS:  Current Outpatient Prescriptions  Medication Sig Dispense Refill  . acetaminophen (TYLENOL) 500 MG tablet Take 500 mg by mouth every 6 (six) hours as needed for headache.    Marland Kitchen atenolol (TENORMIN) 25 MG tablet Take by mouth daily.    . Cyanocobalamin (B-12) 1000 MCG SUBL Place 1,000 mcg under the tongue daily. 30 each 3  . diclofenac sodium (VOLTAREN) 1 % GEL Apply 2 g topically daily as needed. For pain on legs    . iron polysaccharides (NIFEREX) 150 MG capsule Take 1 capsule (150 mg total) by mouth daily. 30 capsule 2  . omeprazole (PRILOSEC) 20 MG capsule Take 20 mg by mouth as needed.     . warfarin (COUMADIN) 5 MG tablet Take 5-7.5 mg by mouth daily. On Monday, Wednesday, and Friday take 1 tab (5mg ) All other days take 1 1/2 tablet (7.5mg )     No current facility-administered medications for this visit.     REVIEW OF SYSTEMS:    10 Point review of Systems was done is negative except as noted above.  PHYSICAL EXAMINATION: ECOG PERFORMANCE STATUS: 2 - Symptomatic, <50% confined to bed  . Vitals:   10/02/16 0949  BP: (!) 157/70  Pulse: 60  Resp: 16  Temp: 98.8 F (37.1 C)  SpO2: 98%   Filed Weights   10/02/16 0949  Weight: 122 lb 1.6 oz (55.4 kg)   .Body mass index is 20.32 kg/m.  GENERAL:alert, in no acute distress and comfortable SKIN: no acute rashes, no significant lesions EYES: conjunctiva are pink and non-injected, sclera anicteric OROPHARYNX: MMM, no exudates, no oropharyngeal erythema or ulceration NECK: supple, no JVD LYMPH:  no palpable lymphadenopathy in the cervical, axillary regions. Very  small borderline palpable inguinal lymph nodes. LUNGS: clear to auscultation b/l with normal respiratory effort HEART: Irregular rhythm ABDOMEN:  normoactive bowel sounds , non tender, not distended. Extremity: no pedal edema PSYCH: alert & oriented x 3 with fluent speech NEURO: no focal motor/sensory deficits  LABORATORY DATA:  I have reviewed the data as listed  .Marland Kitchen CBC Latest Ref Rng & Units 09/29/2016 09/18/2016 07/25/2016  WBC 4.0 - 10.5 K/uL 11.1(H) 10.0 10.1  Hemoglobin 12.0 - 15.0 g/dL 10.9(L) 10.3(L) 10.4(L)  Hematocrit 36.0 - 46.0 % 34.9(L) 33.4(L) 33.5(L)  Platelets 150 - 400 K/uL 146(L) 127(L) 130(L)     . CBC    Component Value Date/Time   WBC 11.1 (H) 09/29/2016 1057   RBC 4.42 09/29/2016 1057   HGB 10.9 (L) 09/29/2016 1057   HGB 10.3 (L) 09/18/2016 1141   HCT 34.9 (L) 09/29/2016 1057   HCT  33.4 (L) 09/18/2016 1141   PLT 146 (L) 09/29/2016 1057   PLT 127 (L) 09/18/2016 1141   MCV 79.0 09/29/2016 1057   MCV 81.1 09/18/2016 1141   MCH 24.7 (L) 09/29/2016 1057   MCHC 31.2 09/29/2016 1057   RDW 17.0 (H) 09/29/2016 1057   RDW 17.0 (H) 09/18/2016 1141   LYMPHSABS 8.2 (H) 09/29/2016 1057   LYMPHSABS 7.4 (H) 09/18/2016 1141   MONOABS 0.4 09/29/2016 1057   MONOABS 0.3 09/18/2016 1141   EOSABS 0.2 09/29/2016 1057   EOSABS 0.2 09/18/2016 1141   BASOSABS 0.0 09/29/2016 1057   BASOSABS 0.0 09/18/2016 1141    . CMP Latest Ref Rng & Units 09/18/2016 07/01/2016 01/01/2016  Glucose 70 - 140 mg/dl 79 86 88  BUN 7.0 - 26.0 mg/dL 11.8 12.3 16  Creatinine 0.6 - 1.1 mg/dL 0.9 0.9 0.95  Sodium 136 - 145 mEq/L 141 143 138  Potassium 3.5 - 5.1 mEq/L 4.1 3.3(L) 3.7  Chloride 101 - 111 mmol/L - - 109  CO2 22 - 29 mEq/L 26 25 23   Calcium 8.4 - 10.4 mg/dL 10.0 10.2 9.8  Total Protein 6.4 - 8.3 g/dL 7.3 7.9 7.4  Total Bilirubin 0.20 - 1.20 mg/dL 0.44 0.97 0.7  Alkaline Phos 40 - 150 U/L 65 67 60  AST 5 - 34 U/L 16 18 25   ALT 0 - 55 U/L 12 13 23        RADIOGRAPHIC STUDIES: I  have personally reviewed the radiological images as listed and agreed with the findings in the report.  PET/CT scan 08/18/2016 IMPRESSION: 1. Scattered primarily mild adenopathy most notable in the bilateral inguinal regions, but also with involvement of the left internal mammary chain, left axilla, retroperitoneum, gastrohepatic ligament, and of a pericardial lymph node. Questionable involvement of a small right station 2 lymph node and at the right axilla. Much of this is Deauville 4 disease, with some Deauville 3 and of L2 disease as noted above. 2. Faintly accentuated activity in the spleen without focal activity or overt splenomegaly. This may merit surveillance. 3. There is some Deauville 3 level activity associated with mild soft tissue prominence of the labia majora. This could be simply incidental. 4. Other imaging findings of potential clinical significance: Aortic Atherosclerosis (ICD10-I70.0). Coronary atherosclerosis. Chronic left occipital lobe encephalomalacia. Hepatic cysts and potential scarring in the right hepatic lobe. Pancreas divisum. Chronic degenerative anterolisthesis at L4-5. Bony demineralization.   Electronically Signed   By: Van Clines M.D.   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 Concern for possible CD5 neg chronic lymphocytic leukemia versus low grade non-Hodgkin's lymphoma NOS  Patient did have some borderline abdominal and inguinal lymphadenopathy on her CT abdomen pelvis in November 2017. PET/CT 6/25 showed Scattered primarily mild adenopathy most notable in the bilateral inguinal regions, but also with involvement of the left internal mammary chain, left axilla, retroperitoneum, gastrohepatic ligament, and of a pericardial lymph node.  Questionable involvement of a small right station 2 lymph node and at the right axilla. Much of this is Deauville 4 disease, with some Deauville 3 and of L2 disease as noted above. 2. Faintly accentuated activity in the spleen without focal activity or overt splenomegaly.  CLL FISH Prognostic panel -- did not show any mutation typical for CLL. Less likely CD5 neg CLL  Plan -US guided Bx of inguinal LN was done- however only limited lymphoid tissue was obtained and the biopsy is nondiagnostic. -I discussed with the patient options for a referral to surgery for an excisional lymph node biopsy for which the Coumadin will need to be interrupted and she would need to be bridged with Lovenox. Option 2 would be to monitor her clinically and with labs and did not pursue an excisional lymph node biopsy at this time. -She chooses the latter option. We will see her back in 2 months with repeat labs and repeat scans in about 4-6 months.  #2 Anemia - microcytic anemia with some element to iron deficiency. Ferritin level of 74 with an iron saturation of 11%. Patient is on oral iron. Hemoglobin is improved to 10.9.  #3 B12 deficiency. B12 levels are coming up from 116 now up to 295 with oral B12 replacement. No evidence of pernicious anemia based on neg antibody testing.  Component     Latest Ref Rng & Units 07/01/2016  Vitamin B12     232 - 1,245 pg/mL 295  Intrinsic Factor Abs, Serum     0.0 - 1.1 AU/mL 0.9  Parietal Cell Ab     0.0 - 20.0 Units 5.7   Plan -continue liq B12  sublingual preparation 1069mcg daily and  given a prescription to start taking after she runs out of her oral B12. -Maintain B12 levels above 500. -Continue oral iron replacement as per primary care physician. Would recommend iron polysaccharide 150 mg orally daily to maintain ferritin levels more than 100 and iron saturation of more than 20%.   #4 mild thrombocytopenic-This has nearly normalized at 146k platelets. could be related  to her NHL -Will continue to monitor.  RTC in 2 months with labs with Dr Irene Limbo Referral to [facility] for weight loss  All of the patients were answered with apparent satisfaction. The patient knows to call the clinic with any problems, questions or concerns.  I spent 20 minutes counseling the patient face to face. The total time spent in the appointment was 20 minutes and more than 50% was on counseling and direct patient cares.    Sullivan Lone MD [place] AAHIVMS [facility] Hematology/Oncology [facility]  (Office):       [phone] (Work cell):  [phone] (Fax):           [phone]

## 2016-10-02 NOTE — Patient Instructions (Signed)
Thank you for choosing [facility] to provide your oncology and hematology care.  To afford each patient quality time with our providers, please arrive 30 minutes before your scheduled appointment time.  If you arrive late for your appointment, you may be asked to reschedule.  We strive to give you quality time with our providers, and arriving late affects you and other patients whose appointments are after yours.   If you are a no show for multiple scheduled visits, you may be dismissed from the clinic at the providers discretion.    Again, thank you for choosing [facility], our hope is that these requests will decrease the amount of time that you wait before being seen by our physicians.  ______________________________________________________________________  Should you have questions after your visit to the [facility], please contact our office at [phone] between the hours of 8:30 and 4:30 p.m.    Voicemails left after 4:30p.m will not be returned until the following business day.    For prescription refill requests, please have your pharmacy contact us directly.  Please also try to allow 48 hours for prescription requests.    Please contact the scheduling department for questions regarding scheduling.  For scheduling of procedures such as PET scans, CT scans, MRI, Ultrasound, etc please contact central scheduling at [phone].    Resources For Cancer Patients and Caregivers:   Oncolink.org:  A wonderful resource for patients and healthcare providers for information regarding your disease, ways to tract your treatment, what to expect, etc.     American Cancer Society:  800-227-2345  Can help patients locate various types of support and financial assistance  Cancer Care: 1-800-813-HOPE (4673) Provides financial assistance, online support groups, medication/co-pay assistance.    [place] DSS:  336-641-3447 Where to apply for food  stamps, Medicaid, and utility assistance  Medicare Rights Center: 800-333-4114 Helps people with Medicare understand their rights and benefits, navigate the Medicare system, and secure the quality healthcare they deserve  SCAT: 336-333-6589 [employer]'s shared-ride transportation service for eligible riders who have a disability that prevents them from riding the fixed route bus.    For additional information on assistance programs please contact our social worker:   Grier Hock/Abigail Elmore:  [phone]            

## 2016-10-03 ENCOUNTER — Ambulatory Visit: Payer: Medicare HMO | Admitting: Nutrition

## 2016-10-03 NOTE — Progress Notes (Signed)
83 year old female diagnosed with B-cell lymphoma.  She is a patient of Dr. Irene Limbo.  Past medical history includes PE, hypertension, hyperlipidemia, gout, GERD, diverticulosis, and atrial fibrillation.  Medications include vitamin B12, Prilosec, and Coumadin.  Labs were reviewed.  Height: 65 inches. Weight: 122.1 pounds. Usual body weight: About 130 pounds. BMI: 20.32.  Patient requested a nutrition consult secondary to continuing weight loss. Patient reports she does have an appetite. She denies nausea and vomiting. Reports occasional diarrhea which is improved with Imodium. Patient consumes 3 small meals daily and tries to drink a regular boost one time a day.  Nutrition diagnosis:  Unintended weight loss related to increase nutrient needs as evidenced by 8 pound weight loss from usual body weight.   Intervention: Patient was educated to increase protein at mealtimes and try to choose high-calorie, high-protein foods. Educated patient on strategies for improving diarrhea. Provided fact sheets. Recommended patient change to boost plus and try to consume 3 times a day between meals. Samples were provided.  Coupons were given. Questions were answered.  Teach back method used.  Contact information provided.  Monitoring, evaluation, goals: Patient will tolerate increased calories and protein to minimize further weight loss.  Next visit: Patient to contact me for questions or concerns.  **Disclaimer: This note was dictated with voice recognition software. Similar sounding words can inadvertently be transcribed and this note may contain transcription errors which may not have been corrected upon publication of note.**

## 2016-10-10 ENCOUNTER — Telehealth: Payer: Self-pay

## 2016-10-10 NOTE — Telephone Encounter (Signed)
[facility] called that the nutrition referral went to [facility] for this [place] [facility] patient. It appears that Dr Irene Limbo corrected this but did not take out the [facility] order. LVM with Dory Peru to verify this and to please see the pt if not done so yet.

## 2016-10-30 DIAGNOSIS — N3281 Overactive bladder: Secondary | ICD-10-CM | POA: Diagnosis not present

## 2016-10-30 DIAGNOSIS — K219 Gastro-esophageal reflux disease without esophagitis: Secondary | ICD-10-CM | POA: Diagnosis not present

## 2016-10-30 DIAGNOSIS — R1909 Other intra-abdominal and pelvic swelling, mass and lump: Secondary | ICD-10-CM | POA: Diagnosis not present

## 2016-10-30 DIAGNOSIS — Z7901 Long term (current) use of anticoagulants: Secondary | ICD-10-CM | POA: Diagnosis not present

## 2016-10-30 DIAGNOSIS — L299 Pruritus, unspecified: Secondary | ICD-10-CM | POA: Diagnosis not present

## 2016-10-30 DIAGNOSIS — Z86711 Personal history of pulmonary embolism: Secondary | ICD-10-CM | POA: Diagnosis not present

## 2016-10-31 ENCOUNTER — Other Ambulatory Visit: Payer: Self-pay | Admitting: Family Medicine

## 2016-10-31 DIAGNOSIS — R1909 Other intra-abdominal and pelvic swelling, mass and lump: Secondary | ICD-10-CM

## 2016-11-13 DIAGNOSIS — Z7901 Long term (current) use of anticoagulants: Secondary | ICD-10-CM | POA: Diagnosis not present

## 2016-11-13 DIAGNOSIS — Z86711 Personal history of pulmonary embolism: Secondary | ICD-10-CM | POA: Diagnosis not present

## 2016-12-01 NOTE — Progress Notes (Signed)
Marland Kitchen    HEMATOLOGY/ONCOLOGY CLINIC NOTE  Date of Service: 12/01/16   Patient Care Team: Carol Ada, MD as PCP - General (Family Medicine)  CHIEF COMPLAINTS/PURPOSE OF CONSULTATION:  F/u for B cell lymphoproliferative process Iron deficiency anemia History of B12 deficiency  HISTORY OF PRESENTING ILLNESS:   Sara Butler is a wonderful 83 y.o. female who has been referred to [place] by Dr .Carol Ada, MD  for evaluation and management of increasing lymphocytosis, anemia and thrombocytopenia.  Patient has a history of hypertension, dyslipidemia, paroxysmal atrial fibrillation on Coumadin, previous history of pulmonary embolism in 2013, history of iron deficiency due to chronic GI losses on oral iron therapy, GERD.  Patient had recent labs with her primary care physician on 05/01/2016 which showed total WBC count of 10.2k with 6.6k lymphocytes, mild anemia with hemoglobin of 11.4 and somewhat microcytic in dialysis with an MCV of 80 and an RDW of 19. She was also noted to have mild thrombocytopenia with a platelet count of 143k.  Patient had workup including getting TSH which is within normal limits. Noted to have significant B12 deficiency with a B12 level of 116. Has been started on oral B12 5000 g daily by her primary care physician. Recommended considering sublingual B12 replacement. Would need to rule out pernicious anemia.   Patient notes that she has been on oral iron replacement on and off.  She reports issues with what is thought to be irritable bowel syndrome for which she is on Myrbetriq.  Patient notes that she had some abdominal discomfort and had a CT of the abdomen and pelvis on 01/01/2016 which showed Prominent bilateral inguinal as well as gastrohepatic lymphadenopathy. These are new from the prior exam of 2014 in the gastrohepatic changes appear to be new from the recent exam from 06/20/2015.  Patient notes no fevers no chills no night sweats no significant unexpected  weight loss. She denies any overt GI bleeding. No other issues with overt nosebleeds gum bleeds or other sources of bleeding.  INTERVAL HISTORY   Pt presents to the office today for f/u on her lymphoproliferative disorder low grade NHL NOS. She is accompanied by her daughter. She reports that she is doing well overall but that she no longer has an interest to cook and has a loss of appetite but her weight has gone up 3 Ibs to 125 as of today 12/02/2016. She also states that she is losing interest in her usual daily activities and expresses anxiety. She reports a headache that she describes as numbing. She is still driving to the grocery store.  We talked in details about her condition and that the fact that her labs are fairly stable and she has no acute new symptoms and that there is no compelling changes that necessitate treatment at this time. Her anxiety centers around the concerns for loss of independence if she got more fatigued. I addressed her anxieties and provided supportive counseling and the patient was grateful for the discussion. On review of systems, pt reports HA, fatigue, loss of appetite and denies fever, chills, and any other associated symptoms.    MEDICAL HISTORY:  Past Medical History:  Diagnosis Date  . Atrial fibrillation ([place])   . Bleeding from anus   . Cervical cancer ([place])   . Diverticulitis of colon   . GERD (gastroesophageal reflux disease)   . Gout   . Hyperlipidemia   . Hypertension   . Pulmonary embolism ([facility])   Iron deficiency anemia due to  chronic blood loss Paroxysmal atrial fibrillation on Coumadin Diverticulosis of the large and this time Vitamin D deficiency Previous history of pulmonary embolism in 2013 Overactive bladder Osteoarthritis of the knee Chronic back pain History of previous GI bleed in 2011 (while on pradaxa)  SURGICAL HISTORY: Past Surgical History:  Procedure Laterality Date  . ABDOMINAL HYSTERECTOMY    . BACK SURGERY       SOCIAL HISTORY: Social History   Social History  . Marital status: Widowed    Spouse name: N/A  . Number of children: 4  . Years of education: 12   Occupational History  . Retired Museum/gallery curator at [employer]  . Smoking status: Former Smoker    Packs/day: 0.10    Years: 58.00    Types: Cigarettes  . Smokeless tobacco: Never Used  . Alcohol use No  . Drug use: No  . Sexual activity: No   Other Topics Concern  . Not on file   Social History Narrative   Widowed. Lives alone.  Normally independent of ADLs and ambulation.    FAMILY HISTORY: Family History  Problem Relation Age of Onset  . Heart failure Mother   . Diabetes Brother   . Cancer Neg Hx     ALLERGIES:  has No Known Allergies.  MEDICATIONS:  Current Outpatient Prescriptions  Medication Sig Dispense Refill  . acetaminophen (TYLENOL) 500 MG tablet Take 500 mg by mouth every 6 (six) hours as needed for headache.    Marland Kitchen atenolol (TENORMIN) 25 MG tablet Take by mouth daily.    . Cyanocobalamin (B-12) 1000 MCG SUBL Place 1,000 mcg under the tongue daily. 30 each 3  . diclofenac sodium (VOLTAREN) 1 % GEL Apply 2 g topically daily as needed. For pain on legs    . iron polysaccharides (NIFEREX) 150 MG capsule Take 1 capsule (150 mg total) by mouth daily. 30 capsule 2  . omeprazole (PRILOSEC) 20 MG capsule Take 20 mg by mouth as needed.     . warfarin (COUMADIN) 5 MG tablet Take 5-7.5 mg by mouth daily. On Monday, Wednesday, and Friday take 1 tab (5mg ) All other days take 1 1/2 tablet (7.5mg )     No current facility-administered medications for this visit.     REVIEW OF SYSTEMS:    10 Point review of Systems was done is negative except as noted above.  PHYSICAL EXAMINATION: ECOG PERFORMANCE STATUS: 2 - Symptomatic, <50% confined to bed  . Vitals:   12/02/16 0920  BP: (!) 177/97  Pulse: 60  Resp: 18  Temp: 99 F (37.2 C)  SpO2: 100%   Filed Weights   12/02/16 0920  Weight:  125 lb (56.7 kg)   .Body mass index is 20.8 kg/m.  GENERAL:alert, in no acute distress and comfortable SKIN: no acute rashes, no significant lesions EYES: conjunctiva are pink and non-injected, sclera anicteric OROPHARYNX: MMM, no exudates, no oropharyngeal erythema or ulceration NECK: supple, no JVD LYMPH:  no palpable lymphadenopathy in the cervical, axillary regions. Very small borderline palpable inguinal lymph nodes. LUNGS: clear to auscultation b/l with normal respiratory effort HEART: Irregular rhythm ABDOMEN:  normoactive bowel sounds , non tender, not distended. Extremity: no pedal edema PSYCH: alert & oriented x 3 with fluent speech NEURO: no focal motor/sensory deficits  LABORATORY DATA:  I have reviewed the data as listed  .Marland Kitchen CBC Latest Ref Rng & Units 12/02/2016 09/29/2016 09/18/2016  WBC 3.9 - 10.3 10e3/uL 14.0(H) 11.1(H) 10.0  Hemoglobin  11.6 - 15.9 g/dL 10.4(L) 10.9(L) 10.3(L)  Hematocrit 34.8 - 46.6 % 32.4(L) 34.9(L) 33.4(L)  Platelets 145 - 400 10e3/uL 131(L) 146(L) 127(L)   CBC    Component Value Date/Time   WBC 11.1 (H) 09/29/2016 1057   RBC 4.42 09/29/2016 1057   HGB 10.9 (L) 09/29/2016 1057   HGB 10.3 (L) 09/18/2016 1141   HCT 34.9 (L) 09/29/2016 1057   HCT 33.4 (L) 09/18/2016 1141   PLT 146 (L) 09/29/2016 1057   PLT 127 (L) 09/18/2016 1141   MCV 79.0 09/29/2016 1057   MCV 81.1 09/18/2016 1141   MCH 24.7 (L) 09/29/2016 1057   MCHC 31.2 09/29/2016 1057   RDW 17.0 (H) 09/29/2016 1057   RDW 17.0 (H) 09/18/2016 1141   LYMPHSABS 8.2 (H) 09/29/2016 1057   LYMPHSABS 7.4 (H) 09/18/2016 1141   MONOABS 0.4 09/29/2016 1057   MONOABS 0.3 09/18/2016 1141   EOSABS 0.2 09/29/2016 1057   EOSABS 0.2 09/18/2016 1141   BASOSABS 0.0 09/29/2016 1057   BASOSABS 0.0 09/18/2016 1141    CMP Latest Ref Rng & Units 09/18/2016 07/01/2016 01/01/2016  Glucose 70 - 140 mg/dl 79 86 88  BUN 7.0 - 26.0 mg/dL 11.8 12.3 16  Creatinine 0.6 - 1.1 mg/dL 0.9 0.9 0.95  Sodium 136 -  145 mEq/L 141 143 138  Potassium 3.5 - 5.1 mEq/L 4.1 3.3(L) 3.7  Chloride 101 - 111 mmol/L - - 109  CO2 22 - 29 mEq/L 26 25 23   Calcium 8.4 - 10.4 mg/dL 10.0 10.2 9.8  Total Protein 6.4 - 8.3 g/dL 7.3 7.9 7.4  Total Bilirubin 0.20 - 1.20 mg/dL 0.44 0.97 0.7  Alkaline Phos 40 - 150 U/L 65 67 60  AST 5 - 34 U/L 16 18 25   ALT 0 - 55 U/L 12 13 23        RADIOGRAPHIC STUDIES: I have personally reviewed the radiological images as listed and agreed with the findings in the report.  PET/CT scan 08/18/2016 IMPRESSION: 1. Scattered primarily mild adenopathy most notable in the bilateral inguinal regions, but also with involvement of the left internal mammary chain, left axilla, retroperitoneum, gastrohepatic ligament, and of a pericardial lymph node. Questionable involvement of a small right station 2 lymph node and at the right axilla. Much of this is Deauville 4 disease, with some Deauville 3 and of L2 disease as noted above. 2. Faintly accentuated activity in the spleen without focal activity or overt splenomegaly. This may merit surveillance. 3. There is some Deauville 3 level activity associated with mild soft tissue prominence of the labia majora. This could be simply incidental. 4. Other imaging findings of potential clinical significance: Aortic Atherosclerosis (ICD10-I70.0). Coronary atherosclerosis. Chronic left occipital lobe encephalomalacia. Hepatic cysts and potential scarring in the right hepatic lobe. Pancreas divisum. Chronic degenerative anterolisthesis at L4-5. Bony demineralization.   Electronically Signed   By: Van Clines M.D.   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 Concern for possible CD5 neg chronic lymphocytic leukemia versus low grade non-Hodgkin's  lymphoma NOS  Patient did have some borderline abdominal and inguinal lymphadenopathy on her CT abdomen pelvis in November 2017. PET/CT 6/25 showed Scattered primarily mild adenopathy most notable in the bilateral inguinal regions, but also with involvement of the left internal mammary chain, left axilla, retroperitoneum, gastrohepatic ligament, and  of a pericardial lymph node. Questionable involvement of a small right station 2 lymph node and at the right axilla. Much of this is Deauville 4 disease, with some Deauville 3 and of L2 disease as noted above. 2. Faintly accentuated activity in the spleen without focal activity or overt splenomegaly.  CLL FISH Prognostic panel -- did not show any mutation typical for CLL. Less likely CD5 neg CLL US guided Bx of inguinal LN was done- however only limited lymphoid tissue was obtained and the biopsy is nondiagnostic. Patient had previously decided to hold off  On an excisional lymph node biopsy  #2 Anemia  - microcytic anemia with some element to iron deficiency. Ferritin level of down to 49 Continue PO iron replacement #3 B12 deficiency. B12 levels are coming up from 116 now up to 295 with oral B12 replacement. No evidence of pernicious anemia based on neg antibody testing.  Component     Latest Ref Rng & Units 07/01/2016  Vitamin B12     232 - 1,245 pg/mL 295  Intrinsic Factor Abs, Serum     0.0 - 1.1 AU/mL 0.9  Parietal Cell Ab     0.0 - 20.0 Units 5.7  -levels pending today Plan -Patient's labs are stable with minimal increase in lymphocytosis. No significant overt constitutional symptoms. No significant worsening anemia or thrombocytopenia. -No acute indication for treatment of the patient's indolent non-Hodgkin's lymphoma at this time. --continue liq B12  sublingual preparation 1039mcg daily and  given a prescription to start taking after she runs out of her oral B12. Maintain B12 levels above 500. -She'll follow-up on her pending B12 levels  from today . -Continue oral iron replacement. Would recommend increasing  iron polysaccharide to 150 mg orally BID to maintain ferritin levels more than 100 and iron saturation of more than 20%.   #4 mild thrombocytopenic- stable at 131k platelets. could be related to her NHL -Will continue to monitor.  Referral to dieitician to help with decreased appetite Will continue to monitor for symptoms   RTC in 2 months with labs with Dr Irene Limbo with labs    All of the patients were answered with apparent satisfaction. The patient knows to call the clinic with any problems, questions or concerns.  I spent 20 minutes counseling the patient face to face. The total time spent in the appointment was 25 minutes and more than 50% was on counseling and direct patient cares.    Sullivan Lone MD [place] AAHIVMS [facility] Hematology/Oncology Physician [facility]  (Office):       [phone] (Work cell):  [phone] (Fax):           [phone]  This document serves as a record of services personally performed by Sullivan Lone, MD. It was created on her behalf by Alean Rinne, a trained medical scribe. The creation of this record is based on the scribe's personal observations and the provider's statements to them. This document has been checked and approved by the attending provider.

## 2016-12-02 ENCOUNTER — Ambulatory Visit (HOSPITAL_BASED_OUTPATIENT_CLINIC_OR_DEPARTMENT_OTHER): Payer: Medicare HMO | Admitting: Hematology

## 2016-12-02 ENCOUNTER — Telehealth: Payer: Self-pay

## 2016-12-02 ENCOUNTER — Other Ambulatory Visit (HOSPITAL_BASED_OUTPATIENT_CLINIC_OR_DEPARTMENT_OTHER): Payer: Medicare HMO

## 2016-12-02 ENCOUNTER — Telehealth: Payer: Self-pay | Admitting: Hematology

## 2016-12-02 ENCOUNTER — Encounter: Payer: Self-pay | Admitting: Hematology

## 2016-12-02 VITALS — BP 177/97 | HR 60 | Temp 99.0°F | Resp 18 | Ht 65.0 in | Wt 125.0 lb

## 2016-12-02 DIAGNOSIS — C8308 Small cell B-cell lymphoma, lymph nodes of multiple sites: Secondary | ICD-10-CM

## 2016-12-02 DIAGNOSIS — C8588 Other specified types of non-Hodgkin lymphoma, lymph nodes of multiple sites: Secondary | ICD-10-CM

## 2016-12-02 DIAGNOSIS — D509 Iron deficiency anemia, unspecified: Secondary | ICD-10-CM | POA: Diagnosis not present

## 2016-12-02 DIAGNOSIS — D696 Thrombocytopenia, unspecified: Secondary | ICD-10-CM

## 2016-12-02 DIAGNOSIS — D519 Vitamin B12 deficiency anemia, unspecified: Secondary | ICD-10-CM

## 2016-12-02 DIAGNOSIS — D500 Iron deficiency anemia secondary to blood loss (chronic): Secondary | ICD-10-CM

## 2016-12-02 DIAGNOSIS — D538 Other specified nutritional anemias: Secondary | ICD-10-CM

## 2016-12-02 LAB — CBC & DIFF AND RETIC
BASO%: 0.2 % (ref 0.0–2.0)
BASOS ABS: 0 10*3/uL (ref 0.0–0.1)
EOS%: 1.7 % (ref 0.0–7.0)
Eosinophils Absolute: 0.2 10*3/uL (ref 0.0–0.5)
HEMATOCRIT: 32.4 % — AB (ref 34.8–46.6)
HGB: 10.4 g/dL — ABNORMAL LOW (ref 11.6–15.9)
IMMATURE RETIC FRACT: 4.1 % (ref 1.60–10.00)
LYMPH%: 75.6 % — AB (ref 14.0–49.7)
MCH: 25.4 pg (ref 25.1–34.0)
MCHC: 32.2 g/dL (ref 31.5–36.0)
MCV: 78.8 fL — AB (ref 79.5–101.0)
MONO#: 1 10*3/uL — ABNORMAL HIGH (ref 0.1–0.9)
MONO%: 6.8 % (ref 0.0–14.0)
NEUT#: 2.2 10*3/uL (ref 1.5–6.5)
NEUT%: 15.7 % — AB (ref 38.4–76.8)
PLATELETS: 131 10*3/uL — AB (ref 145–400)
RBC: 4.11 10*6/uL (ref 3.70–5.45)
RDW: 19.1 % — ABNORMAL HIGH (ref 11.2–14.5)
Retic %: 1.35 % (ref 0.70–2.10)
Retic Ct Abs: 55.49 10*3/uL (ref 33.70–90.70)
WBC: 14 10*3/uL — ABNORMAL HIGH (ref 3.9–10.3)
lymph#: 10.6 10*3/uL — ABNORMAL HIGH (ref 0.9–3.3)

## 2016-12-02 LAB — COMPREHENSIVE METABOLIC PANEL
ALT: 14 U/L (ref 0–55)
ANION GAP: 6 meq/L (ref 3–11)
AST: 20 U/L (ref 5–34)
Albumin: 3.3 g/dL — ABNORMAL LOW (ref 3.5–5.0)
Alkaline Phosphatase: 67 U/L (ref 40–150)
BILIRUBIN TOTAL: 0.47 mg/dL (ref 0.20–1.20)
BUN: 14.2 mg/dL (ref 7.0–26.0)
CALCIUM: 10.1 mg/dL (ref 8.4–10.4)
CO2: 26 meq/L (ref 22–29)
CREATININE: 0.9 mg/dL (ref 0.6–1.1)
Chloride: 111 mEq/L — ABNORMAL HIGH (ref 98–109)
EGFR: 71 mL/min/{1.73_m2} — AB (ref >90)
Glucose: 89 mg/dl (ref 70–140)
Potassium: 3.9 mEq/L (ref 3.5–5.1)
Sodium: 142 mEq/L (ref 136–145)
TOTAL PROTEIN: 7.6 g/dL (ref 6.4–8.3)

## 2016-12-02 LAB — LACTATE DEHYDROGENASE: LDH: 208 U/L (ref 125–245)

## 2016-12-02 LAB — TECHNOLOGIST REVIEW

## 2016-12-02 LAB — FERRITIN: FERRITIN: 49 ng/mL (ref 9–269)

## 2016-12-02 NOTE — Telephone Encounter (Signed)
Unable to reach pt on the phone or leave VM. Called pt daughter, Leone Payor, to pass along medication information from Dr. Irene Limbo. Based on recent lab work, Dr. Irene Limbo would like for the pt to be started on two medications OTC:  Iron polysaccharide 150mg  PO bid for iron deficiency B12 1068mcg liq SL for vitamin B12 deficiency  Pt daughter verbalized thanks for the information, and that she would pass this along to her mother.

## 2016-12-02 NOTE — Telephone Encounter (Signed)
Gave avs and calendar for December  °

## 2016-12-03 LAB — VITAMIN B12: Vitamin B12: 722 pg/mL (ref 232–1245)

## 2016-12-11 DIAGNOSIS — Z7901 Long term (current) use of anticoagulants: Secondary | ICD-10-CM | POA: Diagnosis not present

## 2016-12-11 DIAGNOSIS — Z86711 Personal history of pulmonary embolism: Secondary | ICD-10-CM | POA: Diagnosis not present

## 2016-12-17 DIAGNOSIS — Z7901 Long term (current) use of anticoagulants: Secondary | ICD-10-CM | POA: Diagnosis not present

## 2016-12-17 DIAGNOSIS — Z86711 Personal history of pulmonary embolism: Secondary | ICD-10-CM | POA: Diagnosis not present

## 2017-01-07 DIAGNOSIS — Z7901 Long term (current) use of anticoagulants: Secondary | ICD-10-CM | POA: Diagnosis not present

## 2017-01-07 DIAGNOSIS — Z86711 Personal history of pulmonary embolism: Secondary | ICD-10-CM | POA: Diagnosis not present

## 2017-01-08 ENCOUNTER — Encounter (HOSPITAL_COMMUNITY): Payer: Self-pay

## 2017-01-08 ENCOUNTER — Other Ambulatory Visit: Payer: Self-pay

## 2017-01-08 ENCOUNTER — Emergency Department (HOSPITAL_COMMUNITY): Payer: Medicare HMO

## 2017-01-08 ENCOUNTER — Emergency Department (HOSPITAL_COMMUNITY)
Admission: EM | Admit: 2017-01-08 | Discharge: 2017-01-08 | Disposition: A | Discharge status: Home / Self Care | Payer: Medicare HMO | Attending: Emergency Medicine | Admitting: Emergency Medicine

## 2017-01-08 DIAGNOSIS — S4992XA Unspecified injury of left shoulder and upper arm, initial encounter: Secondary | ICD-10-CM

## 2017-01-08 DIAGNOSIS — Y999 Unspecified external cause status: Secondary | ICD-10-CM | POA: Diagnosis not present

## 2017-01-08 DIAGNOSIS — M1990 Unspecified osteoarthritis, unspecified site: Secondary | ICD-10-CM | POA: Diagnosis not present

## 2017-01-08 DIAGNOSIS — Z87891 Personal history of nicotine dependence: Secondary | ICD-10-CM | POA: Insufficient documentation

## 2017-01-08 DIAGNOSIS — Z7901 Long term (current) use of anticoagulants: Secondary | ICD-10-CM | POA: Diagnosis not present

## 2017-01-08 DIAGNOSIS — X58XXXA Exposure to other specified factors, initial encounter: Secondary | ICD-10-CM | POA: Insufficient documentation

## 2017-01-08 DIAGNOSIS — M19011 Primary osteoarthritis, right shoulder: Secondary | ICD-10-CM | POA: Diagnosis not present

## 2017-01-08 DIAGNOSIS — Z79899 Other long term (current) drug therapy: Secondary | ICD-10-CM | POA: Insufficient documentation

## 2017-01-08 DIAGNOSIS — M25512 Pain in left shoulder: Secondary | ICD-10-CM | POA: Diagnosis not present

## 2017-01-08 DIAGNOSIS — S46002A Unspecified injury of muscle(s) and tendon(s) of the rotator cuff of left shoulder, initial encounter: Secondary | ICD-10-CM | POA: Insufficient documentation

## 2017-01-08 DIAGNOSIS — I10 Essential (primary) hypertension: Secondary | ICD-10-CM | POA: Insufficient documentation

## 2017-01-08 DIAGNOSIS — M79641 Pain in right hand: Secondary | ICD-10-CM | POA: Diagnosis not present

## 2017-01-08 DIAGNOSIS — D696 Thrombocytopenia, unspecified: Secondary | ICD-10-CM | POA: Diagnosis not present

## 2017-01-08 DIAGNOSIS — R52 Pain, unspecified: Secondary | ICD-10-CM

## 2017-01-08 DIAGNOSIS — R300 Dysuria: Secondary | ICD-10-CM | POA: Diagnosis not present

## 2017-01-08 DIAGNOSIS — Y929 Unspecified place or not applicable: Secondary | ICD-10-CM | POA: Insufficient documentation

## 2017-01-08 DIAGNOSIS — D649 Anemia, unspecified: Secondary | ICD-10-CM | POA: Diagnosis not present

## 2017-01-08 DIAGNOSIS — I4891 Unspecified atrial fibrillation: Secondary | ICD-10-CM | POA: Diagnosis not present

## 2017-01-08 DIAGNOSIS — M7989 Other specified soft tissue disorders: Secondary | ICD-10-CM | POA: Diagnosis not present

## 2017-01-08 DIAGNOSIS — Y939 Activity, unspecified: Secondary | ICD-10-CM | POA: Insufficient documentation

## 2017-01-08 DIAGNOSIS — D72820 Lymphocytosis (symptomatic): Secondary | ICD-10-CM | POA: Diagnosis not present

## 2017-01-08 DIAGNOSIS — R609 Edema, unspecified: Secondary | ICD-10-CM

## 2017-01-08 DIAGNOSIS — S43432A Superior glenoid labrum lesion of left shoulder, initial encounter: Secondary | ICD-10-CM | POA: Diagnosis not present

## 2017-01-08 LAB — COMPREHENSIVE METABOLIC PANEL
ALK PHOS: 58 U/L (ref 38–126)
ALT: 11 U/L — AB (ref 14–54)
AST: 18 U/L (ref 15–41)
Albumin: 3.1 g/dL — ABNORMAL LOW (ref 3.5–5.0)
Anion gap: 8 (ref 5–15)
BUN: 9 mg/dL (ref 6–20)
CALCIUM: 9.4 mg/dL (ref 8.9–10.3)
CO2: 23 mmol/L (ref 22–32)
CREATININE: 0.72 mg/dL (ref 0.44–1.00)
Chloride: 106 mmol/L (ref 101–111)
Glucose, Bld: 95 mg/dL (ref 65–99)
Potassium: 3.3 mmol/L — ABNORMAL LOW (ref 3.5–5.1)
Sodium: 137 mmol/L (ref 135–145)
Total Bilirubin: 1 mg/dL (ref 0.3–1.2)
Total Protein: 7 g/dL (ref 6.5–8.1)

## 2017-01-08 LAB — CBC WITH DIFFERENTIAL/PLATELET
BASOS PCT: 0 %
Basophils Absolute: 0 10*3/uL (ref 0.0–0.1)
EOS PCT: 2 %
Eosinophils Absolute: 0.3 10*3/uL (ref 0.0–0.7)
HCT: 31.4 % — ABNORMAL LOW (ref 36.0–46.0)
HEMOGLOBIN: 9.9 g/dL — AB (ref 12.0–15.0)
LYMPHS PCT: 74 %
Lymphs Abs: 9.5 10*3/uL — ABNORMAL HIGH (ref 0.7–4.0)
MCH: 24.4 pg — AB (ref 26.0–34.0)
MCHC: 31.5 g/dL (ref 30.0–36.0)
MCV: 77.5 fL — AB (ref 78.0–100.0)
Monocytes Absolute: 0.6 10*3/uL (ref 0.1–1.0)
Monocytes Relative: 5 %
NEUTROS PCT: 19 %
Neutro Abs: 2.4 10*3/uL (ref 1.7–7.7)
Platelets: 119 10*3/uL — ABNORMAL LOW (ref 150–400)
RBC: 4.05 MIL/uL (ref 3.87–5.11)
RDW: 18.2 % — ABNORMAL HIGH (ref 11.5–15.5)
WBC: 12.8 10*3/uL — ABNORMAL HIGH (ref 4.0–10.5)

## 2017-01-08 LAB — URINALYSIS, ROUTINE W REFLEX MICROSCOPIC
Bilirubin Urine: NEGATIVE
GLUCOSE, UA: NEGATIVE mg/dL
HGB URINE DIPSTICK: NEGATIVE
Ketones, ur: NEGATIVE mg/dL
Leukocytes, UA: NEGATIVE
Nitrite: NEGATIVE
PROTEIN: NEGATIVE mg/dL
Specific Gravity, Urine: 1.006 (ref 1.005–1.030)
pH: 7 (ref 5.0–8.0)

## 2017-01-08 LAB — PROTIME-INR
INR: 2.05
Prothrombin Time: 22.9 seconds — ABNORMAL HIGH (ref 11.4–15.2)

## 2017-01-08 LAB — PATHOLOGIST SMEAR REVIEW

## 2017-01-08 LAB — I-STAT TROPONIN, ED: TROPONIN I, POC: 0 ng/mL (ref 0.00–0.08)

## 2017-01-08 MED ORDER — POTASSIUM CHLORIDE CRYS ER 20 MEQ PO TBCR: 40.0000 meq | EXTENDED_RELEASE_TABLET | Freq: Every day | ORAL | 0 refills | Status: DC

## 2017-01-08 MED ORDER — OXYCODONE HCL 5 MG PO TABS
2.5000 mg | ORAL_TABLET | Freq: Once | ORAL | Status: AC
  Administered 2017-01-08: 2.5 mg via ORAL
  Filled 2017-01-08: qty 1

## 2017-01-08 MED ORDER — DOCUSATE SODIUM 250 MG PO CAPS: 250.0000 mg | ORAL_CAPSULE | Freq: Every day | ORAL | 0 refills | Status: DC

## 2017-01-08 MED ORDER — TRAMADOL HCL 50 MG PO TABS
50.0000 mg | ORAL_TABLET | Freq: Once | ORAL | Status: AC
  Administered 2017-01-08: 50 mg via ORAL
  Filled 2017-01-08: qty 1

## 2017-01-08 MED ORDER — KETOROLAC TROMETHAMINE 60 MG/2ML IM SOLN
30.0000 mg | Freq: Once | INTRAMUSCULAR | Status: AC
  Administered 2017-01-08: 30 mg via INTRAMUSCULAR
  Filled 2017-01-08: qty 2

## 2017-01-08 MED ORDER — KETOROLAC TROMETHAMINE 15 MG/ML IJ SOLN: 15.0000 mg | Freq: Once | INTRAMUSCULAR | Status: DC

## 2017-01-08 MED ORDER — OXYCODONE HCL 5 MG PO TABS: 5.0000 mg | ORAL_TABLET | Freq: Four times a day (QID) | ORAL | 0 refills | Status: DC | PRN

## 2017-01-08 NOTE — ED Notes (Signed)
Pt up to bathroom in wheelchair with family member. Pt did not want to use bedpan.

## 2017-01-08 NOTE — ED Notes (Signed)
ED Provider at bedside. 

## 2017-01-08 NOTE — ED Triage Notes (Addendum)
Pt here for left shoulder pain, described as aching. She denies any injury. Pt reports hx of same. Pt also states urinary frequency, pt states she has been urinating q30 minutes. Pt denies any burning with urination.

## 2017-01-08 NOTE — ED Provider Notes (Signed)
[facility] EMERGENCY DEPARTMENT Provider Note   CSN: [id number] Arrival date & time: 01/08/17  0818     History   Chief Complaint Chief Complaint  Patient presents with  . Shoulder Pain  . Urinary Frequency    HPI Sara Butler is a 83 y.o. female.  HPI   83 yo F with PMHx of HTN, HLD, PE, AFib on coumadin here with shoulder pain, dysuria, and finger pain.  Shoulder pain: Pt reports sx started 2-3 days ago as aching, throbbing L shoulder pain. Pain is worse with movement and palpation, particularly flexion at the shoulder. No alleviating factors. She has not had any recent trauma. No neck pain, no numbness or weakness. She began to notice mild pain in her right index finger joint yesterday as well. No neck pain. No numbness or weakness. No CP, SOB. These joints have all hurt her before.  Frequency: Pt reports urinary frequency for several weeks.  This is been ongoing.  She has no dysuria or hematuria.  No flank pain.  She has not seen her doctor for this.  No recent medication changes.  No abdominal pain or fullness associated with this.  Past Medical History:  Diagnosis Date  . Atrial fibrillation ([place])   . Bleeding from anus   . Cervical cancer ([place])   . Diverticulitis of colon   . GERD (gastroesophageal reflux disease)   . Gout   . Hyperlipidemia   . Hypertension   . Pulmonary embolism [facility])     Patient Active Problem List   Diagnosis Date Noted  . Chest pain 07/04/2014  . Pseudogout of right wrist 07/04/2014  . Acute gout   . Pulmonary emboli ([place]) 04/26/2011  . Hypertension   . Atrial fibrillation ([place])   . Hyperlipidemia   . GERD (gastroesophageal reflux disease)     Past Surgical History:  Procedure Laterality Date  . ABDOMINAL HYSTERECTOMY    . BACK SURGERY      OB History    No data available       Home Medications    Prior to Admission medications   Medication Sig Start Date End Date Taking? Authorizing Provider    acetaminophen (TYLENOL) 500 MG tablet Take 500 mg by mouth every 6 (six) hours as needed for headache.   Yes [provider]  atenolol (TENORMIN) 50 MG tablet Take 50 mg daily by mouth.    Yes [provider]  Cyanocobalamin (B-12) 1000 MCG SUBL Place 1,000 mcg under the tongue daily. 07/01/16  Yes Brunetta Genera, MD  diclofenac sodium (VOLTAREN) 1 % GEL Apply 2 g topically daily as needed. For pain on legs   Yes [provider]  iron polysaccharides (NIFEREX) 150 MG capsule Take 1 capsule (150 mg total) by mouth daily. 07/25/16  Yes Brunetta Genera, MD  omeprazole (PRILOSEC) 20 MG capsule Take 20 mg by mouth as needed.    Yes [provider]  warfarin (COUMADIN) 5 MG tablet Take 5-7.5 mg daily at 6 PM by mouth.    Yes [provider]  docusate sodium (COLACE) 250 MG capsule Take 1 capsule (250 mg total) daily by mouth. While taking pain medications 01/08/17   Duffy Bruce, MD  oxyCODONE (ROXICODONE) 5 MG immediate release tablet Take 1 tablet (5 mg total) every 6 (six) hours as needed by mouth (Take one half (2.5 mg) to one full (5 mg) tablet every 6-8 hours as needed for severe pain). 01/08/17   Ellender Hose,  Lysbeth Galas, MD  potassium chloride SA (K-DUR,KLOR-CON) 20 MEQ tablet Take 2 tablets (40 mEq total) daily for 3 days by mouth. 01/08/17 01/11/17  Duffy Bruce, MD    Family History Family History  Problem Relation Age of Onset  . Heart failure Mother   . Diabetes Brother   . Cancer Neg Hx     Social History Social History   Tobacco Use  . Smoking status: Former Smoker    Packs/day: 0.10    Years: 58.00    Pack years: 5.80    Types: Cigarettes  . Smokeless tobacco: Never Used  Substance Use Topics  . Alcohol use: No  . Drug use: No     Allergies   Patient has no known allergies.   Review of Systems Review of Systems  Genitourinary: Positive for frequency.  Musculoskeletal: Positive for arthralgias and myalgias.  All  other systems reviewed and are negative.    Physical Exam Updated Vital Signs BP (!) 172/86 (BP Location: Right Arm)   Pulse 72   Resp (!) 21   Ht 5\' 5"  (1.651 m)   Wt 57.2 kg (126 lb)   SpO2 100%   BMI 20.97 kg/m   Physical Exam  Constitutional: She is oriented to person, place, and time. She appears well-developed and well-nourished. No distress.  HENT:  Head: Normocephalic and atraumatic.  Eyes: Conjunctivae are normal.  Neck: Neck supple.  Cardiovascular: Normal rate, regular rhythm and normal heart sounds. Exam reveals no friction rub.  No murmur heard. Pulmonary/Chest: Effort normal and breath sounds normal. No respiratory distress. She has no wheezes. She has no rales.  Abdominal: Soft. Bowel sounds are normal. She exhibits no distension. There is no tenderness. There is no rebound and no guarding.  Musculoskeletal: She exhibits no edema.  Neurological: She is alert and oriented to person, place, and time. She exhibits normal muscle tone.  Skin: Skin is warm. Capillary refill takes less than 2 seconds.  Psychiatric: She has a normal mood and affect.  Nursing note and vitals reviewed.   UPPER EXTREMITY EXAM: LEFT  INSPECTION & PALPATION: Marked pinpoint TTP over superior and lateral aspect of shoulder, with exquisite pain with flexion and external rotation. No joint effusion or warmth.   SENSORY: Sensation is intact to light touch in:  Superficial radial nerve distribution (dorsal first web space) Median nerve distribution (tip of index finger)   Ulnar nerve distribution (tip of small finger)     MOTOR:  + Motor posterior interosseous nerve (thumb IP extension) + Anterior interosseous nerve (thumb IP flexion, index finger DIP flexion) + Radial nerve (wrist extension) + Median nerve (palpable firing thenar mass) + Ulnar nerve (palpable firing of first dorsal interosseous muscle)  VASCULAR: 2+ radial pulse Brisk capillary refill < 2 sec, fingers warm and  well-perfused  COMPARTMENTS: Soft, warm, well-perfused No pain with passive extension No paresthesias  UPPER EXTREMITY EXAM: RIGHT  INSPECTION & PALPATION: Chronic arthritic changes throughout hand with mild TTP over index finger MCP, no warmth. No open wounds.  SENSORY: Sensation is intact to light touch in:  Superficial radial nerve distribution (dorsal first web space) Median nerve distribution (tip of index finger)   Ulnar nerve distribution (tip of small finger)     MOTOR:  + Motor posterior interosseous nerve (thumb IP extension) + Anterior interosseous nerve (thumb IP flexion, index finger DIP flexion) + Radial nerve (wrist extension) + Median nerve (palpable firing thenar mass) + Ulnar nerve (palpable firing of first dorsal interosseous muscle)  VASCULAR: 2+ radial pulse Brisk capillary refill < 2 sec, fingers warm and well-perfused  COMPARTMENTS: Soft, warm, well-perfused No pain with passive extension No paresthesias    ED Treatments / Results  Labs (all labs ordered are listed, but only abnormal results are displayed) Labs Reviewed  CBC WITH DIFFERENTIAL/PLATELET - Abnormal; Notable for the following components:      Result Value   WBC 12.8 (*)    Hemoglobin 9.9 (*)    HCT 31.4 (*)    MCV 77.5 (*)    MCH 24.4 (*)    RDW 18.2 (*)    Platelets 119 (*)    Lymphs Abs 9.5 (*)    All other components within normal limits  COMPREHENSIVE METABOLIC PANEL - Abnormal; Notable for the following components:   Potassium 3.3 (*)    Albumin 3.1 (*)    ALT 11 (*)    All other components within normal limits  URINALYSIS, ROUTINE W REFLEX MICROSCOPIC - Abnormal; Notable for the following components:   Color, Urine STRAW (*)    All other components within normal limits  PROTIME-INR - Abnormal; Notable for the following components:   Prothrombin Time 22.9 (*)    All other components within normal limits  PATHOLOGIST SMEAR REVIEW  I-STAT TROPONIN, ED    EKG  EKG  Interpretation  Date/Time:  Thursday January 08 2017 09:56:51 EST Ventricular Rate:  73 PR Interval:    QRS Duration: 86 QT Interval:  369 QTC Calculation: 407 R Axis:   33 Text Interpretation:  Sinus rhythm Consider left ventricular hypertrophy Baseline wander in lead(s) V3 No significant change since last tracing Confirmed by Duffy Bruce [phone]) on 01/08/2017 1:20:04 PM       Radiology Dg Chest 2 View  Result Date: 01/08/2017 CLINICAL DATA:  Hypertension, atrial fibrillation, history of pulmonary embolism in the past, former smoker. No acute chest there is no pleural effusion. The heart is top-normal in size but stable. The pulmonary vascularity is normal. There is calcification in the wall of the AA Complaints. EXAM: CHEST  2 VIEW COMPARISON:  Chest x-ray of Jul 04, 2014 FINDINGS: The lungs are well-expanded. There is no focal infiltrate. The heart and pulmonary vascularity are normal. The mediastinum is normal in width. There is calcification in the wall of the aortic arch. There is no pleural effusion. The bony thorax exhibits no acute abnormality. IMPRESSION: There is no acute cardiopulmonary abnormality. Thoracic aortic atherosclerosis. Electronically Signed   By: David  Martinique M.D.   On: 01/08/2017 09:23   Dg Shoulder Left  Result Date: 01/08/2017 CLINICAL DATA:  Left shoulder injury.  Joint pain. EXAM: LEFT SHOULDER - 2+ VIEW COMPARISON:  No recent prior. FINDINGS: Diffuse osteopenia. Acromioclavicular and glenohumeral degenerative change. Calcification in the supraspinatus region suggesting calcific supraspinatus tendinosis. Glenoid labral fracture cannot be completely excluded . No evidence separation or dislocation . IMPRESSION: 1. Glenoid labral fracture cannot be completely excluded. 2. Diffuse osteopenia. Acromioclavicular and glenohumeral degenerative change. Calcifications in the supraspinatus region most consistent calcific supraspinatus tendinosis. Electronically Signed    By: Marcello Moores  Register   On: 01/08/2017 09:26   Dg Hand Complete Right  Result Date: 01/08/2017 CLINICAL DATA:  Ionic right second MCP joint swelling pain. History of gout. EXAM: RIGHT HAND - COMPLETE 3+ VIEW COMPARISON:  None in PACs FINDINGS: The bones are subjectively osteopenic. There is no acute or healing fracture. There is mild narrowing of the DIP joint of the index finger. The other DIP joints appear normal  as do the PIP joints. There is mild-to-moderate narrowing of the second MCP joint. There are no erosive changes. Mild proliferative changes along the articular margins are observed. No abnormal periarticular soft tissue calcifications are observed. There is moderate to severe degenerative change of the first [facility] joint. The other [place] joints appear normal. There is mild degenerative change of the articulation of the distal pole of the scaphoid with the trapezium and trapezoid. There is calcification of the triangular fibrocartilage. IMPRESSION: There is moderate osteoarthritic change of the second MCP joint. Mild to moderate osteoarthritic change of the DIP joint of the index finger. No erosive changes are observed. Moderate to severe osteoarthritic change of the first [facility] joint. Milder degenerative change of the articulation of scaphoid with the trapezium and trapezoid. Electronically Signed   By: David  Martinique M.D.   On: 01/08/2017 09:26    Procedures Procedures (including critical care time)  Medications Ordered in ED Medications  traMADol (ULTRAM) tablet 50 mg (50 mg Oral Given 01/08/17 0949)  oxyCODONE (Oxy IR/ROXICODONE) immediate release tablet 2.5 mg (2.5 mg Oral Given 01/08/17 1204)  ketorolac (TORADOL) injection 30 mg (30 mg Intramuscular Given 01/08/17 1204)     Initial Impression / Assessment and Plan / ED Course  I have reviewed the triage vital signs and the nursing notes.  Pertinent labs & imaging results that were available during my care of the patient were reviewed  by me and considered in my medical decision making (see chart for details).     83 yo F with PMHx of HTN, HLD, PE, AFib on coumadin here with left shoulder pain, urinary frequency.  Regarding her shoulder pain, exam is c/w suspected rotator cuff injury and tendonitis. Imaging shows significant degenerative change with possible labral injury. Will give sling for comfort but counseled on ROM to avoid frozen shoulder, and will refer to PCP and ortho. Do not suspect this is referred pain from cardiac etiology. CXR clear, EKG non-ischemic. Pt is o/w without complaints. Pain completely reproducible on exam.  Regarding her frequency, this has been an ongoing issue. May be 2/2 incontinence, possible med effect. No signs of UTI. Renal function normal. F/u with PCP.  Regarding her finger pain, she has severe arthritis. Continue supportive care. No signs of septic/inflammatory process. WBC elevated but this is chronic and decreased from prior. No pain with pROM.  Final Clinical Impressions(s) / ED Diagnoses   Final diagnoses:  Injury of glenoid labrum, left, initial encounter  Arthritis    ED Discharge Orders        Ordered    oxyCODONE (ROXICODONE) 5 MG immediate release tablet  Every 6 hours PRN     01/08/17 1310    potassium chloride SA (K-DUR,KLOR-CON) 20 MEQ tablet  Daily     01/08/17 1310    docusate sodium (COLACE) 250 MG capsule  Daily     01/08/17 1310       Duffy Bruce, MD 01/08/17 2020

## 2017-01-08 NOTE — Discharge Instructions (Signed)
Do NOT use your sling for more than 1 week, and do your best to exercise/move your shoulder at least once every 1-2 hours to help prevent frozen shoulder  No heavy lifting

## 2017-01-09 NOTE — ED Notes (Signed)
2.5 mg of ordered oxycodone wasted with Jenny Reichmann, RN. Unable to waste in system due to pt being d/c from [place]. Wasted in sharp box, witnessed by [place], Therapist, sports

## 2017-01-30 ENCOUNTER — Telehealth: Payer: Self-pay | Admitting: *Deleted

## 2017-01-30 ENCOUNTER — Other Ambulatory Visit: Payer: Medicare HMO

## 2017-01-30 ENCOUNTER — Ambulatory Visit: Payer: Medicare HMO | Admitting: Hematology

## 2017-01-30 NOTE — Telephone Encounter (Signed)
SW pt regarding upcoming inclement weather.  Pt wishes to keep apts.  Advised pt to call us if she wishes to reschedule.  Pt verbalized understanding.

## 2017-02-02 ENCOUNTER — Ambulatory Visit: Payer: Medicare HMO | Admitting: Hematology

## 2017-02-02 ENCOUNTER — Encounter: Payer: Medicare HMO | Admitting: Nutrition

## 2017-02-02 ENCOUNTER — Other Ambulatory Visit: Payer: Medicare HMO

## 2017-02-10 DIAGNOSIS — Z7901 Long term (current) use of anticoagulants: Secondary | ICD-10-CM | POA: Diagnosis not present

## 2017-02-10 DIAGNOSIS — Z86711 Personal history of pulmonary embolism: Secondary | ICD-10-CM | POA: Diagnosis not present

## 2017-03-02 DIAGNOSIS — Z7901 Long term (current) use of anticoagulants: Secondary | ICD-10-CM | POA: Diagnosis not present

## 2017-03-02 DIAGNOSIS — Z86711 Personal history of pulmonary embolism: Secondary | ICD-10-CM | POA: Diagnosis not present

## 2017-03-03 NOTE — Progress Notes (Signed)
Marland Kitchen    HEMATOLOGY/ONCOLOGY CLINIC NOTE  Date of Service: 03/04/17   Patient Care Team: Carol Ada, MD as PCP - General (Family Medicine)  CHIEF COMPLAINTS/PURPOSE OF CONSULTATION:  F/u for B cell lymphoproliferative process Iron deficiency anemia History of B12 deficiency  HISTORY OF PRESENTING ILLNESS:   Sara Butler is a wonderful 84 y.o. female who has been referred to [place] by Dr .Carol Ada, MD  for evaluation and management of increasing lymphocytosis, anemia and thrombocytopenia.  Patient has a history of hypertension, dyslipidemia, paroxysmal atrial fibrillation on Coumadin, previous history of pulmonary embolism in 2013, history of iron deficiency due to chronic GI losses on oral iron therapy, GERD.  Patient had recent labs with her primary care physician on 05/01/2016 which showed total WBC count of 10.2k with 6.6k lymphocytes, mild anemia with hemoglobin of 11.4 and somewhat microcytic in dialysis with an MCV of 80 and an RDW of 19. She was also noted to have mild thrombocytopenia with a platelet count of 143k.  Patient had workup including getting TSH which is within normal limits. Noted to have significant B12 deficiency with a B12 level of 116. Has been started on oral B12 5000 g daily by her primary care physician. Recommended considering sublingual B12 replacement. Would need to rule out pernicious anemia.   Patient notes that she has been on oral iron replacement on and off.  She reports issues with what is thought to be irritable bowel syndrome for which she is on Myrbetriq.  Patient notes that she had some abdominal discomfort and had a CT of the abdomen and pelvis on 01/01/2016 which showed Prominent bilateral inguinal as well as gastrohepatic lymphadenopathy. These are new from the prior exam of 2014 in the gastrohepatic changes appear to be new from the recent exam from 06/20/2015.  Patient notes no fevers no chills no night sweats no significant unexpected  weight loss. She denies any overt GI bleeding. No other issues with overt nosebleeds gum bleeds or other sources of bleeding.  INTERVAL HISTORY   Sara Butler is her for f/u on her lymphoproliferative disorder low grade NHL NOS. She presents to the clinic today accompanied by her granddaughter.   She notes she has lost some weight since last visit. She is down to 120lbs from 125lbs. She notes she has not been eating well overall. She notes to have an appetite but not getting the food and meals she needs due to some accessibility issues. She notes her legs from her knees down are very sore and feels hard. This has been going on for the past 3-4 weeks. She has not been taking oral iron or B12 regularly. She notes the iron pills turn her stool dark and BMs are more frequent. She thinks this contributes to her weight loss. She notes that she had a recent UTI and has started treatment. She has brittle nails that can be related to her iron and B12.  She notes she presented to the ED for the pain in her shoulder.  No fevers/chills/night sweats or new palpable LNadenopathy. No abdominal/distension.  Her granddaughter notes pt is able to function at home well, but will often forget to take her medication.   MEDICAL HISTORY:  Past Medical History:  Diagnosis Date  . Atrial fibrillation ([place])   . Bleeding from anus   . Cervical cancer ([place])   . Diverticulitis of colon   . GERD (gastroesophageal reflux disease)   . Gout   . Hyperlipidemia   .  Hypertension   . Pulmonary embolism ([facility])   Iron deficiency anemia due to chronic blood loss Paroxysmal atrial fibrillation on Coumadin Diverticulosis of the large and this time Vitamin D deficiency Previous history of pulmonary embolism in 2013 Overactive bladder Osteoarthritis of the knee Chronic back pain History of previous GI bleed in 2011 (while on pradaxa)  SURGICAL HISTORY: Past Surgical History:  Procedure Laterality Date  . ABDOMINAL  HYSTERECTOMY    . BACK SURGERY      SOCIAL HISTORY: Social History   Socioeconomic History  . Marital status: Widowed    Spouse name: Not on file  . Number of children: 4  . Years of education: 41  . Highest education level: Not on file  Social Needs  . Financial resource strain: Not on file  . Food insecurity - worry: Not on file  . Food insecurity - inability: Not on file  . Transportation needs - medical: Not on file  . Transportation needs - non-medical: Not on file  Occupational History  . Occupation: Retired Museum/gallery curator at [employer]  . Smoking status: Former Smoker    Packs/day: 0.10    Years: 58.00    Pack years: 5.80    Types: Cigarettes  . Smokeless tobacco: Never Used  Substance and Sexual Activity  . Alcohol use: No  . Drug use: No  . Sexual activity: No  Other Topics Concern  . Not on file  Social History Narrative   Widowed. Lives alone.  Normally independent of ADLs and ambulation.    FAMILY HISTORY: Family History  Problem Relation Age of Onset  . Heart failure Mother   . Diabetes Brother   . Cancer Neg Hx     ALLERGIES:  has No Known Allergies.  MEDICATIONS:  Current Outpatient Medications  Medication Sig Dispense Refill  . acetaminophen (TYLENOL) 500 MG tablet Take 500 mg by mouth every 6 (six) hours as needed for headache.    Marland Kitchen atenolol (TENORMIN) 50 MG tablet Take 50 mg daily by mouth.     . Cyanocobalamin (B-12) 1000 MCG SUBL Place 1,000 mcg under the tongue daily. 30 each 3  . diclofenac sodium (VOLTAREN) 1 % GEL Apply 2 g topically daily as needed. For pain on legs    . omeprazole (PRILOSEC) 20 MG capsule Take 20 mg by mouth as needed.     . warfarin (COUMADIN) 5 MG tablet Take 5-7.5 mg daily at 6 PM by mouth.     . iron polysaccharides (NIFEREX) 150 MG capsule Take 1 capsule (150 mg total) by mouth daily. (Patient not taking: Reported on 03/04/2017) 30 capsule 2   No current facility-administered medications for this visit.      REVIEW OF SYSTEMS:    10 Point review of Systems was done is negative except as noted above.  PHYSICAL EXAMINATION: ECOG PERFORMANCE STATUS: 2 - Symptomatic, <50% confined to bed  . Vitals:   03/04/17 1319  BP: (!) 157/81  Pulse: 80  Resp: 18  Temp: 98.7 F (37.1 C)  SpO2: 100%   Filed Weights   03/04/17 1319  Weight: 120 lb 14.4 oz (54.8 kg)   .Body mass index is 20.12 kg/m.  GENERAL:alert, in no acute distress and comfortable SKIN: no acute rashes, no significant lesions EYES: conjunctiva are pink and non-injected, sclera anicteric OROPHARYNX: MMM, no exudates, no oropharyngeal erythema or ulceration NECK: supple, no JVD LYMPH:  no palpable lymphadenopathy in the cervical, axillary regions. Very small borderline palpable inguinal lymph  nodes. LUNGS: clear to auscultation b/l with normal respiratory effort HEART: Irregular rhythm ABDOMEN:  normoactive bowel sounds , non tender, not distended. Extremity: no pedal edema PSYCH: alert & oriented x 3 with fluent speech NEURO: no focal motor/sensory deficits  LABORATORY DATA:  I have reviewed the data as listed  Component     Latest Ref Rng & Units 03/04/2017  WBC Count     4.0 - 10.3 K/uL 11.5 (H)  RBC     3.70 - 5.45 MIL/uL 4.13  Hemoglobin     11.6 - 15.9 g/dL 10.0 (L)  HCT     34.8 - 46.6 % 32.8 (L)  MCV     79.5 - 101.0 fL 79.4 (L)  MCH     25.1 - 34.0 pg 24.2 (L)  MCHC     31.5 - 36.0 g/dL 30.5 (L)  RDW     11.2 - 16.1 % 17.7 (H)  Platelet Count     145 - 400 K/uL 122 (L)  Neutrophils     % 19  NEUT#     1.5 - 6.5 K/uL 2.1  Abs Granulocyte     1.5 - 6.5 K/uL 2.1  Lymphocytes     % 78  Lymphocyte #     0.9 - 3.3 K/uL 8.9 (H)  Monocytes Relative     % 2  Monocyte #     0.1 - 0.9 K/uL 0.3  Eosinophil     % 1  Eosinophils Absolute     0.0 - 0.5 K/uL 0.2  Basophil     % 0  Basophils Absolute     0.0 - 0.1 K/uL 0.0  Smear Review      ATYPICAL LYMPHS  Sodium     136 - 145 mmol/L 140   Potassium     3.3 - 4.7 mmol/L 4.4  Chloride     98 - 109 mmol/L 109  CO2     22 - 29 mmol/L 24  Glucose     70 - 140 mg/dL 94  BUN     7 - 26 mg/dL 16  Creatinine     0.60 - 1.10 mg/dL 1.07  Calcium     8.4 - 10.4 mg/dL 10.0  Total Protein     6.4 - 8.3 g/dL 7.5  Albumin     3.5 - 5.0 g/dL 3.4 (L)  AST     5 - 34 U/L 20  ALT     0 - 55 U/L 15  Alkaline Phosphatase     40 - 150 U/L 64  Total Bilirubin     0.2 - 1.2 mg/dL 0.7  GFR, Est Non African American     >60 mL/min 46 (L)  GFR, Est African American     >60 mL/min 53 (L)  Anion gap     3 - 11 7  LDH     125 - 245 U/L 203    CBC Latest Ref Rng & Units 03/04/2017 01/08/2017 12/02/2016  WBC 4.0 - 10.5 K/uL - 12.8(H) 14.0(H)  Hemoglobin 12.0 - 15.0 g/dL - 9.9(L) 10.4(L)  Hematocrit 34.8 - 46.6 % 32.8(L) 31.4(L) 32.4(L)  Platelets 150 - 400 K/uL - 119(L) 131(L)   CBC    Component Value Date/Time   WBC 12.8 (H) 01/08/2017 0931   RBC 4.13 03/04/2017 1232   HGB 9.9 (L) 01/08/2017 0931   HGB 10.4 (L) 12/02/2016 0900   HCT 32.8 (L) 03/04/2017 1232   HCT 32.4 (L)  12/02/2016 0900   PLT 119 (L) 01/08/2017 0931   PLT 131 (L) 12/02/2016 0900   MCV 79.4 (L) 03/04/2017 1232   MCV 78.8 (L) 12/02/2016 0900   MCH 24.2 (L) 03/04/2017 1232   MCHC 30.5 (L) 03/04/2017 1232   RDW 17.7 (H) 03/04/2017 1232   RDW 19.1 (H) 12/02/2016 0900   LYMPHSABS 8.9 (H) 03/04/2017 1232   LYMPHSABS 10.6 (H) 12/02/2016 0900   MONOABS 0.3 03/04/2017 1232   MONOABS 1.0 (H) 12/02/2016 0900   EOSABS 0.2 03/04/2017 1232   EOSABS 0.2 12/02/2016 0900   BASOSABS 0.0 03/04/2017 1232   BASOSABS 0.0 12/02/2016 0900    CMP Latest Ref Rng & Units 03/04/2017 01/08/2017 12/02/2016  Glucose 70 - 140 mg/dL 94 95 89  BUN 7 - 26 mg/dL 16 9 14.2  Creatinine 0.60 - 1.10 mg/dL 1.07 0.72 0.9  Sodium 136 - 145 mmol/L 140 137 142  Potassium 3.3 - 4.7 mmol/L 4.4 3.3(L) 3.9  Chloride 98 - 109 mmol/L 109 106 -  CO2 22 - 29 mmol/L 24 23 26   Calcium 8.4 - 10.4  mg/dL 10.0 9.4 10.1  Total Protein 6.4 - 8.3 g/dL 7.5 7.0 7.6  Total Bilirubin 0.2 - 1.2 mg/dL 0.7 1.0 0.47  Alkaline Phos 40 - 150 U/L 64 58 67  AST 5 - 34 U/L 20 18 20   ALT 0 - 55 U/L 15 11(L) 14       RADIOGRAPHIC STUDIES: I have personally reviewed the radiological images as listed and agreed with the findings in the report.  PET/CT scan 08/18/2016 IMPRESSION: 1. Scattered primarily mild adenopathy most notable in the bilateral inguinal regions, but also with involvement of the left internal mammary chain, left axilla, retroperitoneum, gastrohepatic ligament, and of a pericardial lymph node. Questionable involvement of a small right station 2 lymph node and at the right axilla. Much of this is Deauville 4 disease, with some Deauville 3 and of L2 disease as noted above. 2. Faintly accentuated activity in the spleen without focal activity or overt splenomegaly. This may merit surveillance. 3. There is some Deauville 3 level activity associated with mild soft tissue prominence of the labia majora. This could be simply incidental. 4. Other imaging findings of potential clinical significance: Aortic Atherosclerosis (ICD10-I70.0). Coronary atherosclerosis. Chronic left occipital lobe encephalomalacia. Hepatic cysts and potential scarring in the right hepatic lobe. Pancreas divisum. Chronic degenerative anterolisthesis at L4-5. Bony demineralization.   Electronically Signed   By: Van Clines M.D.   On: 08/18/2016 13:09   ASSESSMENT & PLAN:   84 year old female with multiple medical comorbidities with  #1 Low grade Non Hodgkins B cell lymphoma NOS  Flowcytometry suggestive fo Cd20+ CD5-CD 10 neg Low grade Non Hodgkins lymphoma. Concern for possible CD5 neg chronic lymphocytic leukemia versus low grade non-Hodgkin's lymphoma NOS  Patient did have some borderline abdominal and inguinal lymphadenopathy on her CT abdomen pelvis in November 2017. PET/CT 6/25 showed  Scattered primarily mild adenopathy most notable in the bilateral inguinal regions, but also with involvement of the left internal mammary chain, left axilla, retroperitoneum, gastrohepatic ligament, and of a pericardial lymph node. Questionable involvement of a small right station 2 lymph node and at the right axilla. Much of this is Deauville 4 disease, with some Deauville 3 and of L2 disease as noted above. 2. Faintly accentuated activity in the spleen without focal activity or overt splenomegaly.  CLL FISH Prognostic panel -- did not show any mutation typical for CLL. Less likely CD5 neg  CLL US guided Bx of inguinal LN was done- however only limited lymphoid tissue was obtained and the biopsy is nondiagnostic. Patient had previously decided to hold off  On an excisional lymph node biopsy  #2 Anemia  - microcytic anemia with some element to iron deficiency. Stable hgb at 10. Ferritin level at 49 as of 12/02/16 Continue PO iron replacement (has been non compliant with this)  #3 B12 deficiency. B12 levels are coming up from 116 now up to 295 to 722 with oral B12 replacement. No evidence of pernicious anemia based on neg antibody testing.  Results for TRULA, FREDE (MRN [id number]) as of 03/03/2017 15:52  Ref. Range 07/01/2016 12:46 09/18/2016 11:41 12/02/2016 09:00  Vitamin B12 Latest Ref Range: 232 - 1,245 pg/mL 295 616 722    Plan  -Patient's labs are stable with minimal improvements overall. No significant overt constitutional symptoms. No significant worsening anemia or thrombocytopenia. -No acute indication for treatment of the patient's indolent non-Hodgkin's lymphoma at this time. -continue liq B12  sublingual preparation 1062mcg daily and  given a prescription to start taking after she runs out of her oral B12. Maintain B12 levels above 500. -Continue oral iron replacement. Would recommend increasing iron polysaccharide to 150 mg orally BID to maintain ferritin levels more than 100 and iron  saturation of more than 20%. -I strongly encouraged her to take oral iron and B12 daily as she has trouble remembering to take her medication.    #4 mild thrombocytopenic- platelets slightly decreased to 122K. could be related to her NHL -Will continue to monitor.   #5 Weight Loss -Referral to dietician to help with decreased appetite was previously sent and will continue to monitor for symptoms -She has less access to food, which is more likely to contribute to her weight loss than her blood disorder.  -I advised her to take ensure boost supplements -I offered to set up Social worker consult about getting her free meals. She declined at this time.    RTC with Dr Irene Limbo in 2 months with labs    All of the patients were answered with apparent satisfaction. The patient knows to call the clinic with any problems, questions or concerns.  I spent 20 minutes counseling the patient face to face. The total time spent in the appointment was 25 minutes and more than 50% was on counseling and direct patient cares.    Sullivan Lone MD [place] AAHIVMS [facility] Hematology/Oncology Physician [facility]  (Office):       [phone] (Work cell):  [phone] (Fax):           [phone]  This document serves as a record of services personally performed by Sullivan Lone, MD. It was created on his behalf by Joslyn Devon, a trained medical scribe. The creation of this record is based on the scribe's personal observations and the provider's statements to them.    .I have reviewed the above documentation for accuracy and completeness, and I agree with the above. Brunetta Genera MD MS

## 2017-03-04 ENCOUNTER — Inpatient Hospital Stay: Payer: Medicare HMO | Admitting: Nutrition

## 2017-03-04 ENCOUNTER — Inpatient Hospital Stay: Payer: Medicare HMO

## 2017-03-04 ENCOUNTER — Encounter: Payer: Self-pay | Admitting: Hematology

## 2017-03-04 ENCOUNTER — Telehealth: Payer: Self-pay | Admitting: Hematology

## 2017-03-04 ENCOUNTER — Inpatient Hospital Stay: Payer: Medicare HMO | Attending: Hematology | Admitting: Hematology

## 2017-03-04 ENCOUNTER — Other Ambulatory Visit: Payer: Self-pay | Admitting: *Deleted

## 2017-03-04 VITALS — BP 157/81 | HR 80 | Temp 98.7°F | Resp 18 | Ht 65.0 in | Wt 120.9 lb

## 2017-03-04 DIAGNOSIS — C8308 Small cell B-cell lymphoma, lymph nodes of multiple sites: Secondary | ICD-10-CM

## 2017-03-04 DIAGNOSIS — D500 Iron deficiency anemia secondary to blood loss (chronic): Secondary | ICD-10-CM

## 2017-03-04 DIAGNOSIS — Z87891 Personal history of nicotine dependence: Secondary | ICD-10-CM

## 2017-03-04 DIAGNOSIS — E785 Hyperlipidemia, unspecified: Secondary | ICD-10-CM | POA: Insufficient documentation

## 2017-03-04 DIAGNOSIS — E538 Deficiency of other specified B group vitamins: Secondary | ICD-10-CM | POA: Diagnosis not present

## 2017-03-04 DIAGNOSIS — Z86711 Personal history of pulmonary embolism: Secondary | ICD-10-CM | POA: Insufficient documentation

## 2017-03-04 DIAGNOSIS — I700 Atherosclerosis of aorta: Secondary | ICD-10-CM | POA: Diagnosis not present

## 2017-03-04 DIAGNOSIS — I480 Paroxysmal atrial fibrillation: Secondary | ICD-10-CM | POA: Diagnosis not present

## 2017-03-04 DIAGNOSIS — D696 Thrombocytopenia, unspecified: Secondary | ICD-10-CM

## 2017-03-04 DIAGNOSIS — M549 Dorsalgia, unspecified: Secondary | ICD-10-CM | POA: Diagnosis not present

## 2017-03-04 DIAGNOSIS — I10 Essential (primary) hypertension: Secondary | ICD-10-CM

## 2017-03-04 DIAGNOSIS — D509 Iron deficiency anemia, unspecified: Secondary | ICD-10-CM | POA: Insufficient documentation

## 2017-03-04 DIAGNOSIS — R634 Abnormal weight loss: Secondary | ICD-10-CM | POA: Insufficient documentation

## 2017-03-04 DIAGNOSIS — C8588 Other specified types of non-Hodgkin lymphoma, lymph nodes of multiple sites: Secondary | ICD-10-CM | POA: Diagnosis not present

## 2017-03-04 DIAGNOSIS — Z8541 Personal history of malignant neoplasm of cervix uteri: Secondary | ICD-10-CM | POA: Insufficient documentation

## 2017-03-04 DIAGNOSIS — D519 Vitamin B12 deficiency anemia, unspecified: Secondary | ICD-10-CM

## 2017-03-04 LAB — COMPREHENSIVE METABOLIC PANEL
ALBUMIN: 3.4 g/dL — AB (ref 3.5–5.0)
ALK PHOS: 64 U/L (ref 40–150)
ALT: 15 U/L (ref 0–55)
ANION GAP: 7 (ref 3–11)
AST: 20 U/L (ref 5–34)
BUN: 16 mg/dL (ref 7–26)
CALCIUM: 10 mg/dL (ref 8.4–10.4)
CO2: 24 mmol/L (ref 22–29)
Chloride: 109 mmol/L (ref 98–109)
Creatinine, Ser: 1.07 mg/dL (ref 0.60–1.10)
GFR calc non Af Amer: 46 mL/min — ABNORMAL LOW (ref >60)
GFR, EST AFRICAN AMERICAN: 53 mL/min — AB (ref >60)
Glucose, Bld: 94 mg/dL (ref 70–140)
POTASSIUM: 4.4 mmol/L (ref 3.3–4.7)
SODIUM: 140 mmol/L (ref 136–145)
TOTAL PROTEIN: 7.5 g/dL (ref 6.4–8.3)
Total Bilirubin: 0.7 mg/dL (ref 0.2–1.2)

## 2017-03-04 LAB — CBC WITH DIFFERENTIAL (CANCER CENTER ONLY)
Abs Granulocyte: 2.1 10*3/uL (ref 1.5–6.5)
BASOS ABS: 0 10*3/uL (ref 0.0–0.1)
Basophils Relative: 0 %
EOS PCT: 1 %
Eosinophils Absolute: 0.2 10*3/uL (ref 0.0–0.5)
HCT: 32.8 % — ABNORMAL LOW (ref 34.8–46.6)
Hemoglobin: 10 g/dL — ABNORMAL LOW (ref 11.6–15.9)
LYMPHS PCT: 78 %
Lymphs Abs: 8.9 10*3/uL — ABNORMAL HIGH (ref 0.9–3.3)
MCH: 24.2 pg — ABNORMAL LOW (ref 25.1–34.0)
MCHC: 30.5 g/dL — ABNORMAL LOW (ref 31.5–36.0)
MCV: 79.4 fL — ABNORMAL LOW (ref 79.5–101.0)
Monocytes Absolute: 0.3 10*3/uL (ref 0.1–0.9)
Monocytes Relative: 2 %
NEUTROS ABS: 2.1 10*3/uL (ref 1.5–6.5)
NEUTROS PCT: 19 %
PLATELETS: 122 10*3/uL — AB (ref 145–400)
RBC: 4.13 MIL/uL (ref 3.70–5.45)
RDW: 17.7 % — AB (ref 11.2–16.1)
WBC Count: 11.5 10*3/uL — ABNORMAL HIGH (ref 4.0–10.3)

## 2017-03-04 LAB — LACTATE DEHYDROGENASE: LDH: 203 U/L (ref 125–245)

## 2017-03-04 MED ORDER — B-12 1000 MCG SL SUBL: 1000.0000 ug | SUBLINGUAL_TABLET | Freq: Every day | SUBLINGUAL | 3 refills | Status: DC

## 2017-03-04 MED ORDER — POLYSACCHARIDE IRON COMPLEX 150 MG PO CAPS: 150.0000 mg | ORAL_CAPSULE | Freq: Every day | ORAL | 2 refills | Status: DC

## 2017-03-04 NOTE — Telephone Encounter (Signed)
Gave avs and calendar for march °

## 2017-03-04 NOTE — Patient Instructions (Signed)
Thank you for choosing [facility] to provide your oncology and hematology care.  To afford each patient quality time with our providers, please arrive 30 minutes before your scheduled appointment time.  If you arrive late for your appointment, you may be asked to reschedule.  We strive to give you quality time with our providers, and arriving late affects you and other patients whose appointments are after yours.   If you are a no show for multiple scheduled visits, you may be dismissed from the clinic at the providers discretion.    Again, thank you for choosing [facility], our hope is that these requests will decrease the amount of time that you wait before being seen by our physicians.  ______________________________________________________________________  Should you have questions after your visit to the [facility], please contact our office at [phone] between the hours of 8:30 and 4:30 p.m.    Voicemails left after 4:30p.m will not be returned until the following business day.    For prescription refill requests, please have your pharmacy contact us directly.  Please also try to allow 48 hours for prescription requests.    Please contact the scheduling department for questions regarding scheduling.  For scheduling of procedures such as PET scans, CT scans, MRI, Ultrasound, etc please contact central scheduling at [phone].    Resources For Cancer Patients and Caregivers:   Oncolink.org:  A wonderful resource for patients and healthcare providers for information regarding your disease, ways to tract your treatment, what to expect, etc.     American Cancer Society:  800-227-2345  Can help patients locate various types of support and financial assistance  Cancer Care: 1-800-813-HOPE (4673) Provides financial assistance, online support groups, medication/co-pay assistance.    [place] DSS:  336-641-3447 Where to apply for food  stamps, Medicaid, and utility assistance  Medicare Rights Center: 800-333-4114 Helps people with Medicare understand their rights and benefits, navigate the Medicare system, and secure the quality healthcare they deserve  SCAT: 336-333-6589 [place] [employer]'s shared-ride transportation service for eligible riders who have a disability that prevents them from riding the fixed route bus.    For additional information on assistance programs please contact our social worker:   Grier Hock/Abigail Elmore:  [phone]            

## 2017-03-04 NOTE — Progress Notes (Signed)
Nutrition follow-up completed with patient who has B-cell lymphoma. Patient's weight decreased and documented as 120 pounds, down 10 pounds from her normal weight.   Dietary recall reveals patient does choose healthy foods and adequate protein. Reports she does not drink oral nutrition supplements because they send her "to the bathroom."  Nutrition diagnosis: Unintended weight loss continues.  Intervention: Educated patient on strategies for increasing calories at mealtimes. Recommended patient drink smaller amounts of oral nutrition supplements to establish tolerance. Provided samples and coupons. Fact sheets were given questions were answered.  Teach back method used.  Monitoring, evaluation, goals: Patient will tolerate increased calories and protein to minimize further weight loss.  Next visit: To be scheduled as needed.  **Disclaimer: This note was dictated with voice recognition software. Similar sounding words can inadvertently be transcribed and this note may contain transcription errors which may not have been corrected upon publication of note.**

## 2017-03-11 DIAGNOSIS — Z7901 Long term (current) use of anticoagulants: Secondary | ICD-10-CM | POA: Diagnosis not present

## 2017-03-11 DIAGNOSIS — Z86711 Personal history of pulmonary embolism: Secondary | ICD-10-CM | POA: Diagnosis not present

## 2017-03-18 DIAGNOSIS — Z86711 Personal history of pulmonary embolism: Secondary | ICD-10-CM | POA: Diagnosis not present

## 2017-03-18 DIAGNOSIS — Z7901 Long term (current) use of anticoagulants: Secondary | ICD-10-CM | POA: Diagnosis not present

## 2017-03-26 DIAGNOSIS — Z7901 Long term (current) use of anticoagulants: Secondary | ICD-10-CM | POA: Diagnosis not present

## 2017-03-26 DIAGNOSIS — Z86711 Personal history of pulmonary embolism: Secondary | ICD-10-CM | POA: Diagnosis not present

## 2017-04-02 ENCOUNTER — Encounter: Payer: Medicare HMO | Admitting: Nutrition

## 2017-04-02 ENCOUNTER — Ambulatory Visit: Payer: Medicare HMO | Admitting: Hematology

## 2017-04-02 ENCOUNTER — Other Ambulatory Visit: Payer: Medicare HMO

## 2017-04-08 DIAGNOSIS — Z7901 Long term (current) use of anticoagulants: Secondary | ICD-10-CM | POA: Diagnosis not present

## 2017-04-08 DIAGNOSIS — Z86711 Personal history of pulmonary embolism: Secondary | ICD-10-CM | POA: Diagnosis not present

## 2017-04-22 DIAGNOSIS — Z7901 Long term (current) use of anticoagulants: Secondary | ICD-10-CM | POA: Diagnosis not present

## 2017-04-22 DIAGNOSIS — Z86711 Personal history of pulmonary embolism: Secondary | ICD-10-CM | POA: Diagnosis not present

## 2017-04-24 DIAGNOSIS — Z7901 Long term (current) use of anticoagulants: Secondary | ICD-10-CM | POA: Diagnosis not present

## 2017-04-24 DIAGNOSIS — Z86711 Personal history of pulmonary embolism: Secondary | ICD-10-CM | POA: Diagnosis not present

## 2017-04-29 NOTE — Progress Notes (Signed)
Marland Kitchen    HEMATOLOGY/ONCOLOGY CLINIC NOTE  Date of Service: 05/01/17   Patient Care Team: Carol Ada, MD as PCP - General (Family Medicine)  CHIEF COMPLAINTS/PURPOSE OF CONSULTATION:   F/u for continue mx of low grade NHL Iron def Anemia  HISTORY OF PRESENTING ILLNESS:   Sara Butler is a wonderful 84 y.o. female who has been referred to [place] by Dr .Carol Ada, MD  for evaluation and management of increasing lymphocytosis, anemia and thrombocytopenia.  Patient has a history of hypertension, dyslipidemia, paroxysmal atrial fibrillation on Coumadin, previous history of pulmonary embolism in 2013, history of iron deficiency due to chronic GI losses on oral iron therapy, GERD.  Patient had recent labs with her primary care physician on 05/01/2016 which showed total WBC count of 10.2k with 6.6k lymphocytes, mild anemia with hemoglobin of 11.4 and somewhat microcytic in dialysis with an MCV of 80 and an RDW of 19. She was also noted to have mild thrombocytopenia with a platelet count of 143k.  Patient had workup including getting TSH which is within normal limits. Noted to have significant B12 deficiency with a B12 level of 116. Has been started on oral B12 5000 g daily by her primary care physician. Recommended considering sublingual B12 replacement. Would need to rule out pernicious anemia.   Patient notes that she has been on oral iron replacement on and off.  She reports issues with what is thought to be irritable bowel syndrome for which she is on Myrbetriq.  Patient notes that she had some abdominal discomfort and had a CT of the abdomen and pelvis on 01/01/2016 which showed Prominent bilateral inguinal as well as gastrohepatic lymphadenopathy. These are new from the prior exam of 2014 in the gastrohepatic changes appear to be new from the recent exam from 06/20/2015.  Patient notes no fevers no chills no night sweats no significant unexpected weight loss. She denies any overt GI  bleeding. No other issues with overt nosebleeds gum bleeds or other sources of bleeding.  INTERVAL HISTORY   Sara Butler is her for f/u on her lymphoproliferative disorder -low grade NHL NOS. The patient's last visit with [place] was on 03/04/17. She is accompanied today by her two granddaughters. The pt reports that she is doing well overall and notes that she would like to return to driving since letting her license lapse.   She notes that her legs feel like they are getting tighter and tighter, but that they are not painful. She denies being on water pills.  She is still taking Coumadin for her A-fib and we discussed that DVT would be unlikely while on active anticoagulation.  She notes that she is taking her Iron Polysaccharide once a day, instead of twice a day as we discussed last visit.  Ferritin is lower at 41 despite po iron and discussed about IV iron and patient is agreeable to this.  She affirms that her energy levels haven't changed and that she has occasional lightheadedness. She notes frequent urination during the day and night.    Lab results today (05/01/17) of CBC and Reticulocytes is as follows: all values are WNL except for WBC at 13k, Hgb at 9.5, HCT at 31.6, MCV at 79.2, MCH at 23.8, MCHC at 30.1, RDW at 18.5, Platelets at 130k, Lymphs Abs at 10.2k. CMP 05/01/17 is all WNL except for Albumin at 3.2.  Vitamin B12 05/01/17 is 405 Ferritin 05/01/17 is 41 LDH 05/01/17 is 222.  On review of systems, pt reports  occasional lightheadedness, frequent urination, and denies obvious bleeding, blood in the urine, blood in the stools, nose bleeds, fevers, chills, night sweats, abdominal pain, chest pain, and any other symptoms.    MEDICAL HISTORY:  Past Medical History:  Diagnosis Date  . Atrial fibrillation ([place])   . Bleeding from anus   . Cervical cancer ([place])   . Diverticulitis of colon   . GERD (gastroesophageal reflux disease)   . Gout   . Hyperlipidemia   . Hypertension   .  Pulmonary embolism ([facility])   Iron deficiency anemia due to chronic blood loss Paroxysmal atrial fibrillation on Coumadin Diverticulosis of the large and this time Vitamin D deficiency Previous history of pulmonary embolism in 2013 Overactive bladder Osteoarthritis of the knee Chronic back pain History of previous GI bleed in 2011 (while on pradaxa)  SURGICAL HISTORY: Past Surgical History:  Procedure Laterality Date  . ABDOMINAL HYSTERECTOMY    . BACK SURGERY      SOCIAL HISTORY: Social History   Socioeconomic History  . Marital status: Widowed    Spouse name: Not on file  . Number of children: 4  . Years of education: 76  . Highest education level: Not on file  Social Needs  . Financial resource strain: Not on file  . Food insecurity - worry: Not on file  . Food insecurity - inability: Not on file  . Transportation needs - medical: Not on file  . Transportation needs - non-medical: Not on file  Occupational History  . Occupation: Retired Museum/gallery curator at [employer]  . Smoking status: Former Smoker    Packs/day: 0.10    Years: 58.00    Pack years: 5.80    Types: Cigarettes  . Smokeless tobacco: Never Used  Substance and Sexual Activity  . Alcohol use: No  . Drug use: No  . Sexual activity: No  Other Topics Concern  . Not on file  Social History Narrative   Widowed. Lives alone.  Normally independent of ADLs and ambulation.    FAMILY HISTORY: Family History  Problem Relation Age of Onset  . Heart failure Mother   . Diabetes Brother   . Cancer Neg Hx     ALLERGIES:  has No Known Allergies.  MEDICATIONS:  Current Outpatient Medications  Medication Sig Dispense Refill  . acetaminophen (TYLENOL) 500 MG tablet Take 500 mg by mouth every 6 (six) hours as needed for headache.    Marland Kitchen atenolol (TENORMIN) 50 MG tablet Take 50 mg daily by mouth.     . Cyanocobalamin (B-12) 1000 MCG SUBL Place 1,000 mcg under the tongue daily. 30 each 3  . diclofenac sodium  (VOLTAREN) 1 % GEL Apply 2 g topically daily as needed. For pain on legs    . iron polysaccharides (NIFEREX) 150 MG capsule Take 1 capsule (150 mg total) by mouth daily. (Patient not taking: Reported on 03/04/2017) 30 capsule 2  . omeprazole (PRILOSEC) 20 MG capsule Take 20 mg by mouth as needed.     . warfarin (COUMADIN) 5 MG tablet Take 5-7.5 mg daily at 6 PM by mouth.      No current facility-administered medications for this visit.     REVIEW OF SYSTEMS:    .10 Point review of Systems was done is negative except as noted above.   PHYSICAL EXAMINATION: ECOG PERFORMANCE STATUS: 2 - Symptomatic, <50% confined to bed  . Vitals:   05/01/17 0954  BP: (!) 145/75  Pulse: 77  Resp: 18  Temp: 98.1 F (36.7 C)  SpO2: 100%   Filed Weights   05/01/17 0954  Weight: 119 lb 11.2 oz (54.3 kg)   .Body mass index is 19.92 kg/m.   GENERAL:alert, in no acute distress and comfortable SKIN: no acute rashes, no significant lesions EYES: conjunctiva are pink and non-injected, sclera anicteric OROPHARYNX: MMM, no exudates, no oropharyngeal erythema or ulceration NECK: supple, no JVD LYMPH:  no palpable lymphadenopathy in the cervical, axillary or inguinal regions LUNGS: clear to auscultation b/l with normal respiratory effort HEART: regular rate & rhythm ABDOMEN:  normoactive bowel sounds , non tender, not distended. Extremity: no pedal edema PSYCH: alert & oriented x 3 with fluent speech NEURO: no focal motor/sensory deficits    LABORATORY DATA:  I have reviewed the data as listed  . CBC Latest Ref Rng & Units 05/01/2017 03/04/2017 01/08/2017  WBC 3.9 - 10.3 K/uL 13.0(H) 11.5(H) 12.8(H)  Hemoglobin 12.0 - 15.0 g/dL - - 9.9(L)  Hematocrit 34.8 - 46.6 % 31.6(L) 32.8(L) 31.4(L)  Platelets 145 - 400 K/uL 130(L) 122(L) 119(L)    CBC    Component Value Date/Time   WBC 13.0 (H) 05/01/2017 0921   WBC 12.8 (H) 01/08/2017 0931   RBC 3.99 05/01/2017 0921   RBC 3.99 05/01/2017 0921   HGB  9.9 (L) 01/08/2017 0931   HGB 10.4 (L) 12/02/2016 0900   HCT 31.6 (L) 05/01/2017 0921   HCT 32.4 (L) 12/02/2016 0900   PLT 130 (L) 05/01/2017 0921   PLT 131 (L) 12/02/2016 0900   MCV 79.2 (L) 05/01/2017 0921   MCV 78.8 (L) 12/02/2016 0900   MCH 23.8 (L) 05/01/2017 0921   MCHC 30.1 (L) 05/01/2017 0921   RDW 18.5 (H) 05/01/2017 0921   RDW 19.1 (H) 12/02/2016 0900   LYMPHSABS 10.2 (H) 05/01/2017 0921   LYMPHSABS 10.6 (H) 12/02/2016 0900   MONOABS 0.8 05/01/2017 0921   MONOABS 1.0 (H) 12/02/2016 0900   EOSABS 0.2 05/01/2017 0921   EOSABS 0.2 12/02/2016 0900   BASOSABS 0.0 05/01/2017 0921   BASOSABS 0.0 12/02/2016 0900    CMP Latest Ref Rng & Units 05/01/2017 03/04/2017 01/08/2017  Glucose 70 - 140 mg/dL 90 94 95  BUN 7 - 26 mg/dL 12 16 9   Creatinine 0.60 - 1.10 mg/dL 0.99 1.07 0.72  Sodium 136 - 145 mmol/L 142 140 137  Potassium 3.5 - 5.1 mmol/L 4.1 4.4 3.3(L)  Chloride 98 - 109 mmol/L 109 109 106  CO2 22 - 29 mmol/L 25 24 23   Calcium 8.4 - 10.4 mg/dL 10.3 10.0 9.4  Total Protein 6.4 - 8.3 g/dL 7.7 7.5 7.0  Total Bilirubin 0.2 - 1.2 mg/dL 0.6 0.7 1.0  Alkaline Phos 40 - 150 U/L 65 64 58  AST 5 - 34 U/L 17 20 18   ALT 0 - 55 U/L 12 15 11(L)       RADIOGRAPHIC STUDIES: I have personally reviewed the radiological images as listed and agreed with the findings in the report.  PET/CT scan 08/18/2016 IMPRESSION: 1. Scattered primarily mild adenopathy most notable in the bilateral inguinal regions, but also with involvement of the left internal mammary chain, left axilla, retroperitoneum, gastrohepatic ligament, and of a pericardial lymph node. Questionable involvement of a small right station 2 lymph node and at the right axilla. Much of this is Deauville 4 disease, with some Deauville 3 and of L2 disease as noted above. 2. Faintly accentuated activity in the spleen without focal activity or overt splenomegaly. This may merit surveillance.  3. There is some Deauville 3 level  activity associated with mild soft tissue prominence of the labia majora. This could be simply incidental. 4. Other imaging findings of potential clinical significance: Aortic Atherosclerosis (ICD10-I70.0). Coronary atherosclerosis. Chronic left occipital lobe encephalomalacia. Hepatic cysts and potential scarring in the right hepatic lobe. Pancreas divisum. Chronic degenerative anterolisthesis at L4-5. Bony demineralization.   Electronically Signed   By: Van Clines M.D.   On: 08/18/2016 13:09   ASSESSMENT & PLAN:   84 year old female with multiple medical comorbidities with  #1 Low grade Non Hodgkins B cell lymphoma NOS  Flowcytometry suggestive fo Cd20+ CD5-CD 10 neg Low grade Non Hodgkins lymphoma. Concern for possible CD5 neg chronic lymphocytic leukemia versus low grade non-Hodgkin's lymphoma NOS  Patient did have some borderline abdominal and inguinal lymphadenopathy on her CT abdomen pelvis in November 2017. PET/CT 6/25 showed Scattered primarily mild adenopathy most notable in the bilateral inguinal regions, but also with involvement of the left internal mammary chain, left axilla, retroperitoneum, gastrohepatic ligament, and of a pericardial lymph node. Questionable involvement of a small right station 2 lymph node and at the right axilla. Much of this is Deauville 4 disease, with some Deauville 3 and of L2 disease as noted above. 2. Faintly accentuated activity in the spleen without focal activity or overt splenomegaly.  CLL FISH Prognostic panel -- did not show any mutation typical for CLL. Less likely CD5 neg CLL US guided Bx of inguinal LN was done- however only limited lymphoid tissue was obtained and the biopsy is nondiagnostic. Patient had previously decided to hold off  On an excisional lymph node biopsy  #2 Anemia  - microcytic anemia with some element to iron deficiency.  Stable Hgb at 10-->9.5. Ferritin level at 49 as of 12/02/16 and now is 41  today Plan -given that the hgb and ferritin continue to drop despite po iron and the fact that patient is on active anticoagulation increasing her risk of GI losses, we offered her option of IV Iron. -she is agreeable with IV Iron and we shall set this up.  #3 B12 deficiency. B12 levels are coming up from 116 now up to 295 to 722 with oral B12 replacement. No evidence of pernicious anemia based on neg antibody testing.  Results for MADICYN, MESINA (MRN [id number]) as of 03/03/2017 15:52  Ref. Range 07/01/2016 12:46 09/18/2016 11:41 12/02/2016 09:00  Vitamin B12 Latest Ref Range: 232 - 1,245 pg/mL 295 616 722   B12 today 405  Plan  -No acute indication for treatment of the patient's indolent non-Hodgkin's lymphoma at this time. -continue liq B12  sublingual preparation 1083mcg daily and  given a prescription to start taking after she runs out of her oral B12. Maintain B12 levels above 500. -Will setup for IV Injectafer 750mg  weekly x 2 doses to target ferritin of >100 to take iron def off the table. Any remaining anemia could be from her NHL. -f/u with PCP to consider goals of care and ?GI workup or adjustment of anticoagulation in the setting of likely slow GI losses. -Discussed pt labwork today 04/29/17; Hgb and platelets are stable, and Lymphs Abs have increased a little bit.  -Recommended using compression socks and elevating her legs while sitting.  -The other concern would be that her slow growing lymphoma could be causing her anemia and we would need to consider treating this with Rituxan if anemia worsens despite completely correcting her iron deficiency and B12 deficiency -Recommended discussing urinary symptoms that could  be medication related with her PCP.  -No other obvious evidence of progression of her lymphoma at this point.  -Follow up in 2 months.    #4 mild thrombocytopenic-  could be related to her NHL -Platelets 05/01/17 slightly improved at 130k. -Will continue to  monitor.   #5 Weight Loss . Wt Readings from Last 3 Encounters:  05/01/17 119 lb 11.2 oz (54.3 kg)  03/04/17 120 lb 14.4 oz (54.8 kg)  01/08/17 126 lb (57.2 kg)   -Referral to dietician to help with decreased appetite was previously sent and will continue to monitor for symptoms -I advised her to take ensure boost supplements   RTC with Dr Irene Limbo in 2 months with labs  IV injectafer weekly x 2 doses  All of the patients were answered with apparent satisfaction. The patient knows to call the clinic with any problems, questions or concerns.  . The total time spent in the appointment was 25 minutes and more than 50% was on counseling and direct patient cares.      Sullivan Lone MD [facility] AAHIVMS [facility] Hematology/Oncology Physician [facility]  (Office):       [phone] (Work cell):  [phone] (Fax):           [phone]  This document serves as a record of services personally performed by Sullivan Lone, MD. It was created on his behalf by Baldwin Jamaica, a trained medical scribe. The creation of this record is based on the scribe's personal observations and the provider's statements to them.   .I have reviewed the above documentation for accuracy and completeness, and I agree with the above. Brunetta Genera MD MS

## 2017-05-01 ENCOUNTER — Telehealth: Payer: Self-pay | Admitting: Hematology

## 2017-05-01 ENCOUNTER — Inpatient Hospital Stay: Payer: Medicare HMO | Attending: Hematology | Admitting: Hematology

## 2017-05-01 ENCOUNTER — Inpatient Hospital Stay: Payer: Medicare HMO

## 2017-05-01 VITALS — BP 145/75 | HR 77 | Temp 98.1°F | Resp 18 | Ht 65.0 in | Wt 119.7 lb

## 2017-05-01 DIAGNOSIS — C8588 Other specified types of non-Hodgkin lymphoma, lymph nodes of multiple sites: Secondary | ICD-10-CM | POA: Insufficient documentation

## 2017-05-01 DIAGNOSIS — R634 Abnormal weight loss: Secondary | ICD-10-CM | POA: Diagnosis not present

## 2017-05-01 DIAGNOSIS — D509 Iron deficiency anemia, unspecified: Secondary | ICD-10-CM | POA: Diagnosis not present

## 2017-05-01 DIAGNOSIS — C8308 Small cell B-cell lymphoma, lymph nodes of multiple sites: Secondary | ICD-10-CM

## 2017-05-01 DIAGNOSIS — D500 Iron deficiency anemia secondary to blood loss (chronic): Secondary | ICD-10-CM

## 2017-05-01 DIAGNOSIS — E538 Deficiency of other specified B group vitamins: Secondary | ICD-10-CM | POA: Insufficient documentation

## 2017-05-01 DIAGNOSIS — D696 Thrombocytopenia, unspecified: Secondary | ICD-10-CM | POA: Diagnosis not present

## 2017-05-01 DIAGNOSIS — D519 Vitamin B12 deficiency anemia, unspecified: Secondary | ICD-10-CM

## 2017-05-01 DIAGNOSIS — Z7901 Long term (current) use of anticoagulants: Secondary | ICD-10-CM | POA: Diagnosis not present

## 2017-05-01 DIAGNOSIS — Z86711 Personal history of pulmonary embolism: Secondary | ICD-10-CM | POA: Diagnosis not present

## 2017-05-01 DIAGNOSIS — D649 Anemia, unspecified: Secondary | ICD-10-CM

## 2017-05-01 DIAGNOSIS — I480 Paroxysmal atrial fibrillation: Secondary | ICD-10-CM | POA: Insufficient documentation

## 2017-05-01 DIAGNOSIS — I10 Essential (primary) hypertension: Secondary | ICD-10-CM | POA: Diagnosis not present

## 2017-05-01 LAB — CMP (CANCER CENTER ONLY)
ALBUMIN: 3.2 g/dL — AB (ref 3.5–5.0)
ALK PHOS: 65 U/L (ref 40–150)
ALT: 12 U/L (ref 0–55)
AST: 17 U/L (ref 5–34)
Anion gap: 8 (ref 3–11)
BILIRUBIN TOTAL: 0.6 mg/dL (ref 0.2–1.2)
BUN: 12 mg/dL (ref 7–26)
CO2: 25 mmol/L (ref 22–29)
CREATININE: 0.99 mg/dL (ref 0.60–1.10)
Calcium: 10.3 mg/dL (ref 8.4–10.4)
Chloride: 109 mmol/L (ref 98–109)
GFR, Est AFR Am: 58 mL/min — ABNORMAL LOW (ref >60)
GFR, Estimated: 50 mL/min — ABNORMAL LOW (ref >60)
GLUCOSE: 90 mg/dL (ref 70–140)
Potassium: 4.1 mmol/L (ref 3.5–5.1)
Sodium: 142 mmol/L (ref 136–145)
TOTAL PROTEIN: 7.7 g/dL (ref 6.4–8.3)

## 2017-05-01 LAB — VITAMIN B12: Vitamin B-12: 405 pg/mL (ref 180–914)

## 2017-05-01 LAB — CBC WITH DIFFERENTIAL (CANCER CENTER ONLY)
Basophils Absolute: 0 10*3/uL (ref 0.0–0.1)
Basophils Relative: 0 %
Eosinophils Absolute: 0.2 10*3/uL (ref 0.0–0.5)
Eosinophils Relative: 1 %
HEMATOCRIT: 31.6 % — AB (ref 34.8–46.6)
Hemoglobin: 9.5 g/dL — ABNORMAL LOW (ref 11.6–15.9)
LYMPHS PCT: 79 %
Lymphs Abs: 10.2 10*3/uL — ABNORMAL HIGH (ref 0.9–3.3)
MCH: 23.8 pg — ABNORMAL LOW (ref 25.1–34.0)
MCHC: 30.1 g/dL — AB (ref 31.5–36.0)
MCV: 79.2 fL — AB (ref 79.5–101.0)
MONO ABS: 0.8 10*3/uL (ref 0.1–0.9)
Monocytes Relative: 6 %
NEUTROS ABS: 1.9 10*3/uL (ref 1.5–6.5)
Neutrophils Relative %: 14 %
Platelet Count: 130 10*3/uL — ABNORMAL LOW (ref 145–400)
RBC: 3.99 MIL/uL (ref 3.70–5.45)
RDW: 18.5 % — AB (ref 11.2–14.5)
WBC Count: 13 10*3/uL — ABNORMAL HIGH (ref 3.9–10.3)

## 2017-05-01 LAB — RETICULOCYTES
RBC.: 3.99 MIL/uL (ref 3.70–5.45)
RETIC CT PCT: 1.4 % (ref 0.7–2.1)
Retic Count, Absolute: 55.9 10*3/uL (ref 33.7–90.7)

## 2017-05-01 LAB — FERRITIN: Ferritin: 41 ng/mL (ref 9–269)

## 2017-05-01 LAB — LACTATE DEHYDROGENASE: LDH: 222 U/L (ref 125–245)

## 2017-05-01 NOTE — Telephone Encounter (Signed)
Gave patient AVS and calendar of upcoming may appointments.  °

## 2017-05-02 DIAGNOSIS — D509 Iron deficiency anemia, unspecified: Secondary | ICD-10-CM | POA: Insufficient documentation

## 2017-05-05 ENCOUNTER — Telehealth: Payer: Self-pay

## 2017-05-05 NOTE — Telephone Encounter (Signed)
Pt called per request of Dr. Irene Limbo to notify of low iron. Pt has been taking po iron, but iron continues to be low. Dr. Grier Mitts recommendation is to have pt come in to the clinic for two doses of IV iron. Pt in agreement with plan, and know to expect a call from scheduling in the next two days to set up both treatments based on her availability, pt requires daughter to drive her. Confirmed that the pt is still taking B12 1018mcg SL daily. Pt encouraged to continue taking B12. Pt verbalized understanding of appointments to be scheduled and lab results.

## 2017-05-06 ENCOUNTER — Telehealth: Payer: Self-pay

## 2017-05-06 ENCOUNTER — Encounter: Payer: Self-pay | Admitting: General Practice

## 2017-05-06 NOTE — Progress Notes (Signed)
[place] CSW Progress Notes  Referral received for transportation needs for patient.  Granddaughters have transported in the past, however both are now back at school and unavailable.  CSW reviewed patient - does not have diagnosis of cancer so is not eligible for [facility] or precertification for [facility].  Patient has [employer] and is eligible for [employer].  Transport arranged for [facility] appointment.  Information mailed to patient for future needs.  Patient informed that Shirlee Limerick and [employer] will pick her up at 9:15 on 3.18 for transport to appt, patient can call for return ride when done (trip ID [phone]).  Edwyna Shell, LCSW Clinical Social Worker Phone:  [phone]

## 2017-05-06 NOTE — Telephone Encounter (Signed)
Pt called this morning requesting to try taking oral iron more consistently at home to boost her iron instead of IV iron. Pt communicated difficulty coming to the [facility] because of lack for transportation. Her two granddaughters who typically help her with transport are away at school, and she doesn't have anyone else around to help her.   Spoke with Dr. Irene Limbo, and oral iron supplementation is continuing to be ineffective with pt being non-compliant with intake. At this time it is highly recommended for the pt to come in for iron infusions to boost iron and avoid other potential problems as a result of low iron.   Called pt back and verbalized importance of coming in for her treatments. Appointments created for 10am on 3/18 and 3/25 at [facility]. Asked if pt would be willing to come in if we were able to help her with transportation. Pt verbalized agreement and knows to expect a call back with additional information regarding transportation.  Left VM with Edwyna Shell, CSW to notify this RN of options for transportation assistance d/t inability for pt to get to the facility at this time on her own.

## 2017-05-07 DIAGNOSIS — Z7901 Long term (current) use of anticoagulants: Secondary | ICD-10-CM | POA: Diagnosis not present

## 2017-05-07 DIAGNOSIS — Z86711 Personal history of pulmonary embolism: Secondary | ICD-10-CM | POA: Diagnosis not present

## 2017-05-11 ENCOUNTER — Ambulatory Visit (HOSPITAL_COMMUNITY)
Admission: RE | Admit: 2017-05-11 | Discharge: 2017-05-11 | Disposition: A | Discharge status: Home / Self Care | Payer: Medicare HMO | Source: Ambulatory Visit | Attending: Hematology | Admitting: Hematology

## 2017-05-11 DIAGNOSIS — R6889 Other general symptoms and signs: Secondary | ICD-10-CM | POA: Diagnosis not present

## 2017-05-11 DIAGNOSIS — D500 Iron deficiency anemia secondary to blood loss (chronic): Secondary | ICD-10-CM | POA: Insufficient documentation

## 2017-05-11 MED ORDER — SODIUM CHLORIDE 0.9 % IV SOLN
750.0000 mg | Freq: Once | INTRAVENOUS | Status: AC
  Administered 2017-05-11: 750 mg via INTRAVENOUS
  Filled 2017-05-11: qty 15

## 2017-05-11 NOTE — Discharge Instructions (Signed)
Ferric carboxymaltose injection What is this medicine? FERRIC CARBOXYMALTOSE (ferr-ik car-box-ee-mol-toes) is an iron complex. Iron is used to make healthy red blood cells, which carry oxygen and nutrients throughout the body. This medicine is used to treat anemia in people with chronic kidney disease or people who cannot take iron by mouth. This medicine may be used for other purposes; ask your health care provider or pharmacist if you have questions. COMMON BRAND NAME(S): Injectafer What should I tell my health care provider before I take this medicine? They need to know if you have any of these conditions: -anemia not caused by low iron levels -high levels of iron in the blood -liver disease -an unusual or allergic reaction to iron, other medicines, foods, dyes, or preservatives -pregnant or trying to get pregnant -breast-feeding How should I use this medicine? This medicine is for infusion into a vein. It is given by a health care professional in a hospital or clinic setting. Talk to your pediatrician regarding the use of this medicine in children. Special care may be needed. Overdosage: If you think you have taken too much of this medicine contact a poison control center or emergency room at once. NOTE: This medicine is only for you. Do not share this medicine with others. What if I miss a dose? It is important not to miss your dose. Call your doctor or health care professional if you are unable to keep an appointment. What may interact with this medicine? Do not take this medicine with any of the following medications: -deferoxamine -dimercaprol -other iron products This medicine may also interact with the following medications: -chloramphenicol -deferasirox This list may not describe all possible interactions. Give your health care provider a list of all the medicines, herbs, non-prescription drugs, or dietary supplements you use. Also tell them if you smoke, drink alcohol, or use  illegal drugs. Some items may interact with your medicine. What should I watch for while using this medicine? Visit your doctor or health care professional regularly. Tell your doctor if your symptoms do not start to get better or if they get worse. You may need blood work done while you are taking this medicine. You may need to follow a special diet. Talk to your doctor. Foods that contain iron include: whole grains/cereals, dried fruits, beans, or peas, leafy green vegetables, and organ meats (liver, kidney). What side effects may I notice from receiving this medicine? Side effects that you should report to your doctor or health care professional as soon as possible: -allergic reactions like skin rash, itching or hives, swelling of the face, lips, or tongue -breathing problems -changes in blood pressure -feeling faint or lightheaded, falls -flushing, sweating, or hot feelings Side effects that usually do not require medical attention (report to your doctor or health care professional if they continue or are bothersome): -changes in taste -constipation -dizziness -headache -nausea -pain, redness, or irritation at site where injected -vomiting This list may not describe all possible side effects. Call your doctor for medical advice about side effects. You may report side effects to FDA at 1-800-FDA-1088. Where should I keep my medicine? This drug is given in a hospital or clinic and will not be stored at home. NOTE: This sheet is a summary. It may not cover all possible information. If you have questions about this medicine, talk to your doctor, pharmacist, or health care provider.  2018 Elsevier/Gold Standard (2015-03-15 11:20:47)  

## 2017-05-11 NOTE — Progress Notes (Signed)
PATIENT CARE CENTER NOTE  Diagnosis: Iron Deficiency Anemia   Provider: Dr. Carolyne Fiscal   Procedure: IV Injectafer infusion    Note: Patient received infusion of Injectafer. Patient tolerated infusion well with no adverse reaction. Vitals taken before and after infusion remained stable. Discharge instructions given to patient. Patient alert, oriented and ambulatory at discharge.

## 2017-05-12 ENCOUNTER — Telehealth: Payer: Self-pay | Admitting: General Practice

## 2017-05-12 NOTE — Telephone Encounter (Signed)
[facility] [place] CSW Progress Notes  Phone message received from patient on 3/18 stating that transport to IV infusion appointment was late - CSW reviewed chart, noted that patient had gotten to appointment yesterday.  Left VM requesting call back if there were any problems w transportation, CSW can follow up if needed.   Edwyna Shell, LCSW Clinical Social Worker Phone:  [phone]

## 2017-05-18 ENCOUNTER — Ambulatory Visit (HOSPITAL_COMMUNITY)
Admission: RE | Admit: 2017-05-18 | Discharge: 2017-05-18 | Disposition: A | Discharge status: Home / Self Care | Payer: Medicare HMO | Source: Ambulatory Visit | Attending: Hematology | Admitting: Hematology

## 2017-05-18 ENCOUNTER — Telehealth: Payer: Self-pay

## 2017-05-18 DIAGNOSIS — D500 Iron deficiency anemia secondary to blood loss (chronic): Secondary | ICD-10-CM | POA: Diagnosis not present

## 2017-05-18 MED ORDER — SODIUM CHLORIDE 0.9 % IV SOLN
750.0000 mg | Freq: Once | INTRAVENOUS | Status: AC
  Administered 2017-05-18: 750 mg via INTRAVENOUS
  Filled 2017-05-18: qty 15

## 2017-05-18 NOTE — Telephone Encounter (Signed)
Pt called requesting need to oral iron. Per Dr. Irene Limbo pt to continue taking oral iron. Pt verbalized understanding and thanks for the return call.

## 2017-05-18 NOTE — Discharge Instructions (Signed)
Ferric carboxymaltose injection What is this medicine? FERRIC CARBOXYMALTOSE (ferr-ik car-box-ee-mol-toes) is an iron complex. Iron is used to make healthy red blood cells, which carry oxygen and nutrients throughout the body. This medicine is used to treat anemia in people with chronic kidney disease or people who cannot take iron by mouth. This medicine may be used for other purposes; ask your health care provider or pharmacist if you have questions. COMMON BRAND NAME(S): Injectafer What should I tell my health care provider before I take this medicine? They need to know if you have any of these conditions: -anemia not caused by low iron levels -high levels of iron in the blood -liver disease -an unusual or allergic reaction to iron, other medicines, foods, dyes, or preservatives -pregnant or trying to get pregnant -breast-feeding How should I use this medicine? This medicine is for infusion into a vein. It is given by a health care professional in a hospital or clinic setting. Talk to your pediatrician regarding the use of this medicine in children. Special care may be needed. Overdosage: If you think you have taken too much of this medicine contact a poison control center or emergency room at once. NOTE: This medicine is only for you. Do not share this medicine with others. What if I miss a dose? It is important not to miss your dose. Call your doctor or health care professional if you are unable to keep an appointment. What may interact with this medicine? Do not take this medicine with any of the following medications: -deferoxamine -dimercaprol -other iron products This medicine may also interact with the following medications: -chloramphenicol -deferasirox This list may not describe all possible interactions. Give your health care provider a list of all the medicines, herbs, non-prescription drugs, or dietary supplements you use. Also tell them if you smoke, drink alcohol, or use  illegal drugs. Some items may interact with your medicine. What should I watch for while using this medicine? Visit your doctor or health care professional regularly. Tell your doctor if your symptoms do not start to get better or if they get worse. You may need blood work done while you are taking this medicine. You may need to follow a special diet. Talk to your doctor. Foods that contain iron include: whole grains/cereals, dried fruits, beans, or peas, leafy green vegetables, and organ meats (liver, kidney). What side effects may I notice from receiving this medicine? Side effects that you should report to your doctor or health care professional as soon as possible: -allergic reactions like skin rash, itching or hives, swelling of the face, lips, or tongue -breathing problems -changes in blood pressure -feeling faint or lightheaded, falls -flushing, sweating, or hot feelings Side effects that usually do not require medical attention (report to your doctor or health care professional if they continue or are bothersome): -changes in taste -constipation -dizziness -headache -nausea -pain, redness, or irritation at site where injected -vomiting This list may not describe all possible side effects. Call your doctor for medical advice about side effects. You may report side effects to FDA at 1-800-FDA-1088. Where should I keep my medicine? This drug is given in a hospital or clinic and will not be stored at home. NOTE: This sheet is a summary. It may not cover all possible information. If you have questions about this medicine, talk to your doctor, pharmacist, or health care provider.  2018 Elsevier/Gold Standard (2015-03-15 11:20:47)  

## 2017-05-18 NOTE — Progress Notes (Signed)
PATIENT CARE CENTER NOTE  Diagnosis: Iron Deficiency Anemia   Provider: Dr. Carolyne Fiscal   Procedure: IV Injectafer infusion    Note: Patient received infusion of Injectafer. Patient tolerated infusion well with no adverse reaction. Vitals taken before and after infusion remained stable. Discharge instructions given to patient. Patient alert, oriented and ambulatory at discharge.

## 2017-05-20 ENCOUNTER — Encounter: Payer: Self-pay | Admitting: General Practice

## 2017-05-20 NOTE — Progress Notes (Signed)
[facility] CSW Progress Notes  [facility] transportation application submitted on behalf of patient.    Edwyna Shell, LCSW Clinical Social Worker Phone:  [phone]

## 2017-05-21 DIAGNOSIS — Z86711 Personal history of pulmonary embolism: Secondary | ICD-10-CM | POA: Diagnosis not present

## 2017-05-21 DIAGNOSIS — Z7901 Long term (current) use of anticoagulants: Secondary | ICD-10-CM | POA: Diagnosis not present

## 2017-05-28 DIAGNOSIS — Z7901 Long term (current) use of anticoagulants: Secondary | ICD-10-CM | POA: Diagnosis not present

## 2017-05-28 DIAGNOSIS — Z86711 Personal history of pulmonary embolism: Secondary | ICD-10-CM | POA: Diagnosis not present

## 2017-06-09 DIAGNOSIS — Z7901 Long term (current) use of anticoagulants: Secondary | ICD-10-CM | POA: Diagnosis not present

## 2017-06-09 DIAGNOSIS — Z86711 Personal history of pulmonary embolism: Secondary | ICD-10-CM | POA: Diagnosis not present

## 2017-06-16 DIAGNOSIS — R634 Abnormal weight loss: Secondary | ICD-10-CM | POA: Diagnosis not present

## 2017-06-16 DIAGNOSIS — K922 Gastrointestinal hemorrhage, unspecified: Secondary | ICD-10-CM | POA: Diagnosis not present

## 2017-06-18 ENCOUNTER — Other Ambulatory Visit: Payer: Self-pay | Admitting: Gastroenterology

## 2017-06-18 DIAGNOSIS — I480 Paroxysmal atrial fibrillation: Secondary | ICD-10-CM | POA: Diagnosis not present

## 2017-06-18 DIAGNOSIS — R195 Other fecal abnormalities: Secondary | ICD-10-CM | POA: Diagnosis not present

## 2017-06-18 DIAGNOSIS — R1013 Epigastric pain: Secondary | ICD-10-CM

## 2017-06-18 DIAGNOSIS — Z7901 Long term (current) use of anticoagulants: Secondary | ICD-10-CM | POA: Diagnosis not present

## 2017-06-18 DIAGNOSIS — Z86711 Personal history of pulmonary embolism: Secondary | ICD-10-CM | POA: Diagnosis not present

## 2017-06-22 ENCOUNTER — Telehealth: Payer: Self-pay | Admitting: Hematology

## 2017-06-22 NOTE — Telephone Encounter (Signed)
Called pt re her appt being moved - spoke w/ pt re appts.

## 2017-06-23 ENCOUNTER — Ambulatory Visit
Admission: RE | Admit: 2017-06-23 | Discharge: 2017-06-23 | Disposition: A | Discharge status: Home / Self Care | Payer: Medicare HMO | Source: Ambulatory Visit | Attending: Gastroenterology | Admitting: Gastroenterology

## 2017-06-23 DIAGNOSIS — R1013 Epigastric pain: Secondary | ICD-10-CM

## 2017-06-23 DIAGNOSIS — K449 Diaphragmatic hernia without obstruction or gangrene: Secondary | ICD-10-CM | POA: Diagnosis not present

## 2017-06-29 DIAGNOSIS — D500 Iron deficiency anemia secondary to blood loss (chronic): Secondary | ICD-10-CM | POA: Diagnosis not present

## 2017-06-29 DIAGNOSIS — K219 Gastro-esophageal reflux disease without esophagitis: Secondary | ICD-10-CM | POA: Diagnosis not present

## 2017-06-30 IMAGING — PT NM PET TUM IMG INITIAL (PI) SKULL BASE T - THIGH
8 series · 25 of 25 positions shown · non-contrast
Comparison: CT examinations from 01/01/2016 and 06/20/2015

CLINICAL DATA: Initial treatment strategy for non-Hodgkin' s B-cell
lymphoma.

EXAM:
NUCLEAR MEDICINE PET SKULL BASE TO THIGH
TECHNIQUE: 6.1 MCi F-18 FDG was injected intravenously. Full-ring PET imaging
was performed from the skull base to thigh after the radiotracer. CT
data was obtained and used for attenuation correction and anatomic
localization.
FASTING BLOOD GLUCOSE:  Value: 88 mg/dl

[Series 3: pet sk_thigh ac · axial · 5.0mm · 4.07mm/px · z∈[-854,-58]mm · 5 of 200 slices shown]
[im 1/200]
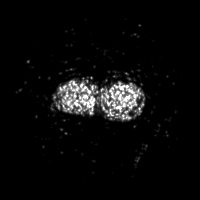
[im 50/200]
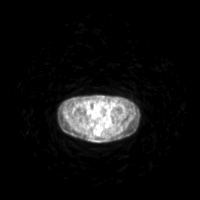
[im 100/200]
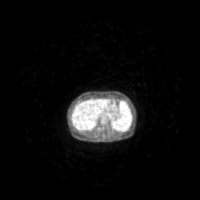
[im 150/200]
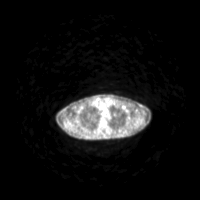
[im 200/200]
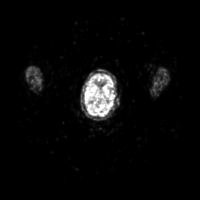

[Series 4: ct sk_thigh 5.0 b31f · axial · 5.0mm · 0.98mm/px · z∈[-854,-58]mm · 5 of 200 slices shown]
[im 1/200]
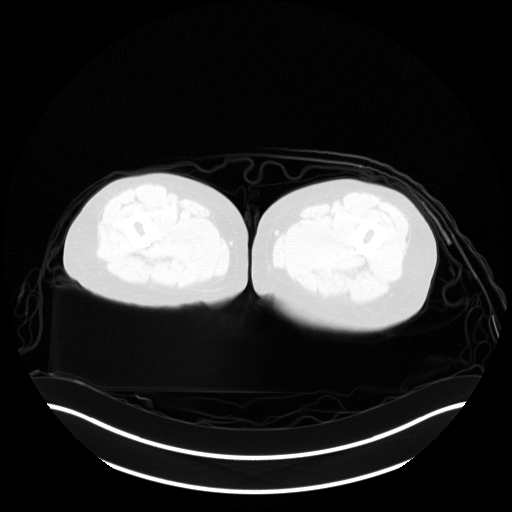
[im 50/200]
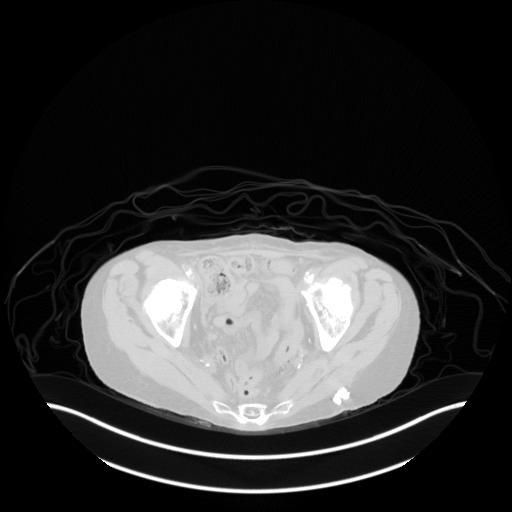
[im 100/200]
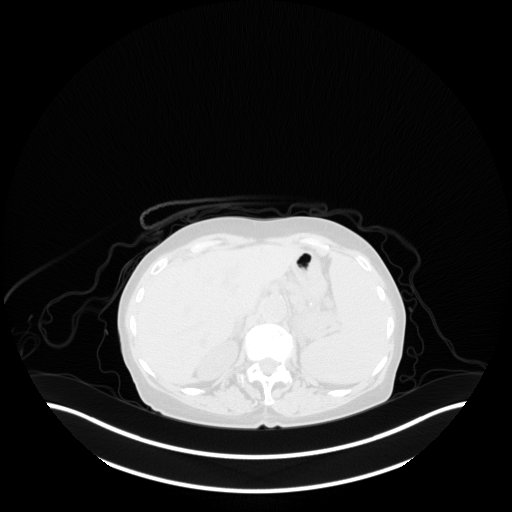
[im 150/200]
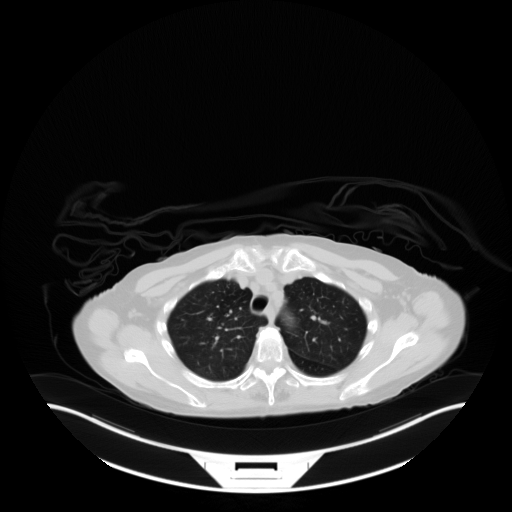
[im 200/200  brain]
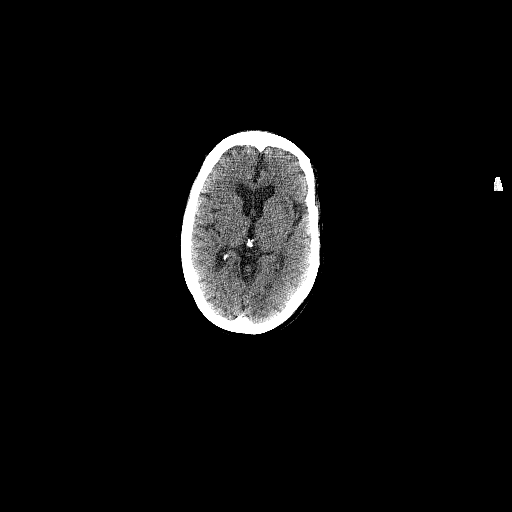

[Series 7: pet sk_thigh nac · axial · 5.0mm · 4.07mm/px · z∈[-854,-58]mm · 5 of 200 slices shown]
[im 1/200]
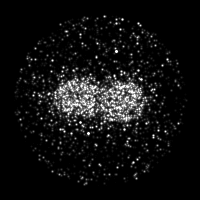
[im 50/200]
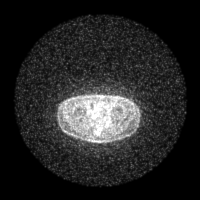
[im 100/200]
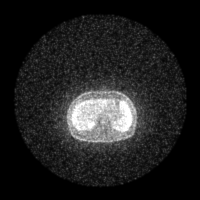
[im 150/200]
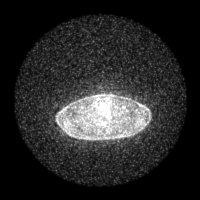
[im 200/200]
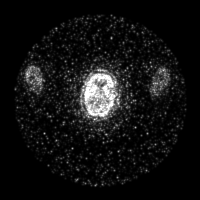

[Series 8: ct sk_thigh 5.0 b70f (id)_bone · axial · 5.0mm · 0.66mm/px · z∈[-430,-194]mm · 2 of 60 slices shown]
[im 1/60  bone]
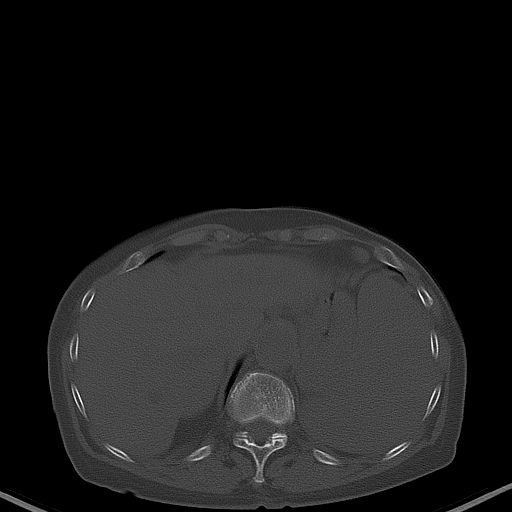
[im 60/60  bone]
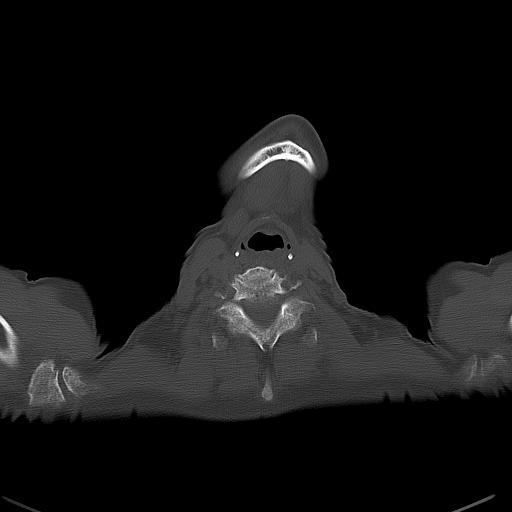

[Series 604: mip collection · coronal · 1.68mm/px · 1 of 32 slices shown]
[im 1/32]
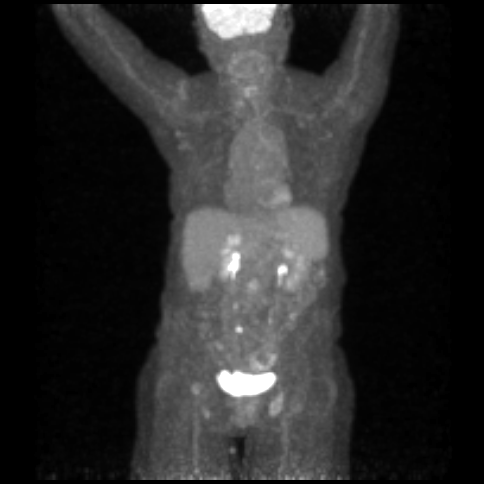

[Series 605: range-ct sk_thigh 5.0 (id)<alpha range> · 2 of 82 slices shown (1 of 2)]
[im 1/82]
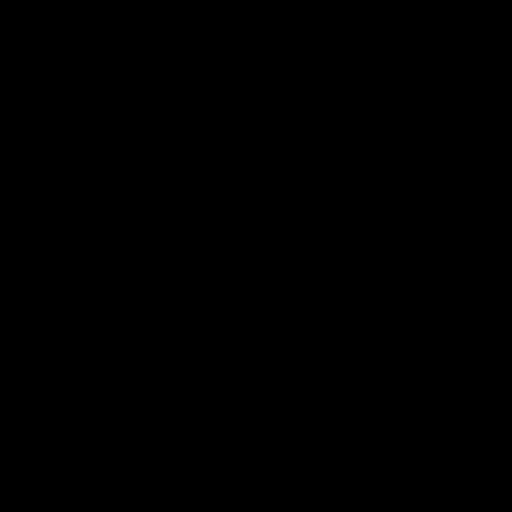
[im 82/82]
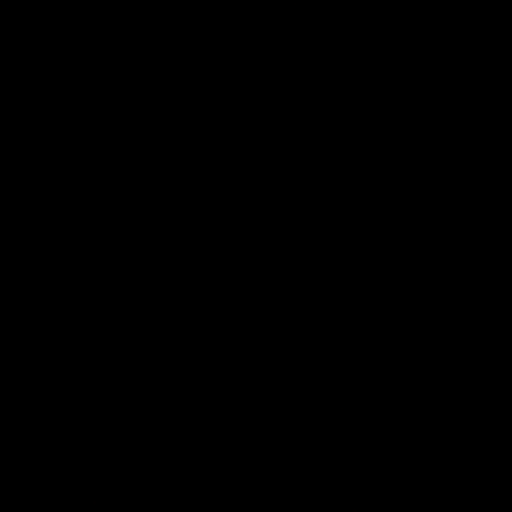

[Series 606: range-ct sk_thigh 5.0 (id)<alpha range> · 4 of 169 slices shown (2 of 2)]
[im 1/169]
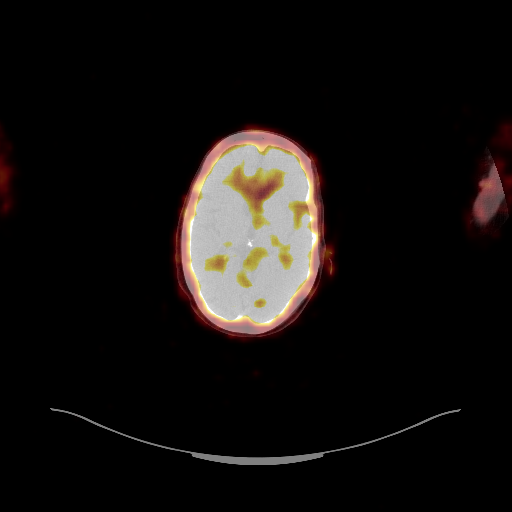
[im 57/169]
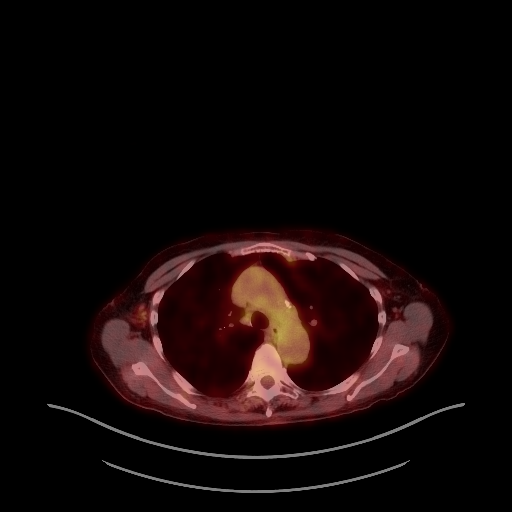
[im 113/169]
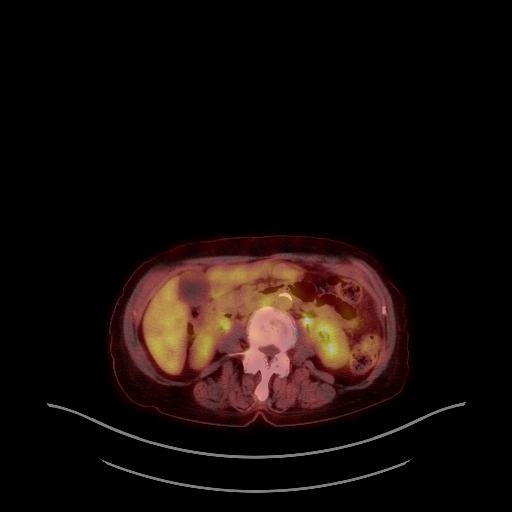
[im 169/169]
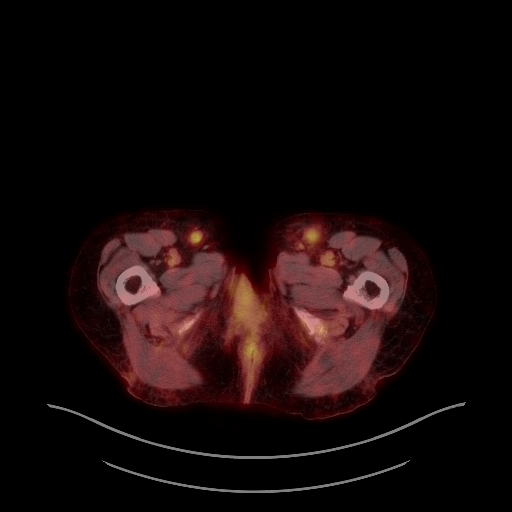

[Series 1032: results mm oncology reading · 5.0mm · 0.68mm/px · 1 of 9 slices shown]
[im 1/9]
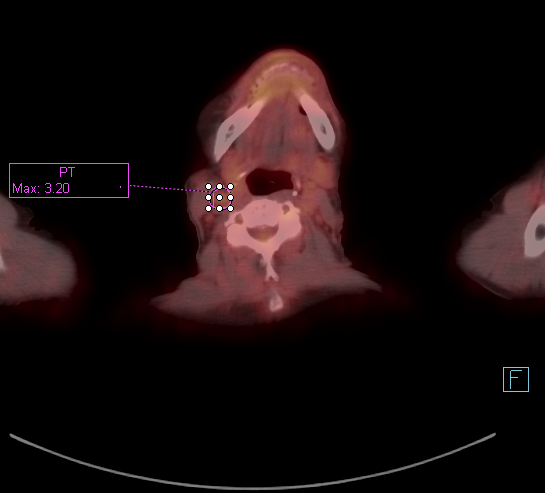

[25 of 25 positions shown; findings below may reference images not displayed]

FINDINGS: NECK

A right station 2 lymph node measuring 0.8 cm in short axis on image
[DATE] has a maximum standard uptake value of 3.2.

Chronic encephalomalacia in the left occipital lobe.

CHEST

A left internal mammary lymph node measuring 0.7 cm in short axis on
image 69/4 has a maximum SUV of 3.9. Small bilateral axillary lymph
nodes are present, a right axillary node measuring 0.6 cm in short
axis on image 56/4 has a maximum SUV of 2.3.

Background blood pool activity SUV is 2.5.

A pericardial lymph node measuring 0.9 cm in short axis on image
94/4 has a maximum SUV of 3.7.

Biapical pleuroparenchymal scarring. Stable 3 mm left upper lobe
nodule on image [DATE]. Stable 3 mm right lower lobe nodule on image
53/8.

Coronary, aortic arch, and branch vessel atherosclerotic vascular
disease.

ABDOMEN/PELVIS

Faint diffuse accentuated metabolic activity in the spleen with the
spleen measuring 12.2 by 4.6 by 9.4 cm (volume = 280 cm^3).

Scar-like appearance along the right hepatic lobe posteriorly with
underlying hypodensity and retracted margin but no underlying I for
metabolic activity. Additional hypodense lesions in the liver are
not hypermetabolic. Background hepatic metabolic activity 3.5.

Indistinct density in the aortocaval region could be from transverse
duodenum or a small lymph node, maximum SUV 5.4. Lymph node favored.

A gastrohepatic lymph node measuring 1.3 cm in short axis on image
96/4 has a maximum SUV of 3.6.

Bilateral hypermetabolic inguinal adenopathy. Left inguinal lymph
node measuring 2.0 cm in short axis has maximum SUV 5.1.

Mild soft tissue density prominence along the labia majora, maximum
SUV 3.6.

Incidental pancreas divisum.

SKELETON

No focal hypermetabolic activity to suggest skeletal metastasis.

Bony demineralization noted. Mild thoracic kyphosis. Chronic
degenerative anterolisthesis at L4-5 not appreciably changed from
prior CT exam.
IMPRESSION: 1. Scattered primarily mild adenopathy most notable in the bilateral
inguinal regions, but also with involvement of the left internal
mammary chain, left axilla, retroperitoneum, gastrohepatic ligament,
and of a pericardial lymph node. Questionable involvement of a small
right station 2 lymph node and at the right axilla. Much of this is
[HOSPITAL] 4 disease, with some [HOSPITAL] 3 and of L2 disease as
noted above.
2. Faintly accentuated activity in the spleen without focal activity
or overt splenomegaly. This may merit surveillance.
3. There is some [HOSPITAL] 3 level activity associated with mild
soft tissue prominence of the labia majora. This could be simply
incidental.
4. Other imaging findings of potential clinical significance: Aortic
Atherosclerosis ([id number]). Coronary atherosclerosis. Chronic
left occipital lobe encephalomalacia. Hepatic cysts and potential
scarring in the right hepatic lobe. Pancreas divisum. Chronic
degenerative anterolisthesis at L4-5. Bony demineralization.

## 2017-07-02 DIAGNOSIS — L309 Dermatitis, unspecified: Secondary | ICD-10-CM | POA: Diagnosis not present

## 2017-07-02 DIAGNOSIS — Z7901 Long term (current) use of anticoagulants: Secondary | ICD-10-CM | POA: Diagnosis not present

## 2017-07-03 ENCOUNTER — Other Ambulatory Visit: Payer: Medicare HMO

## 2017-07-03 ENCOUNTER — Encounter: Payer: Medicare HMO | Admitting: Nutrition

## 2017-07-03 ENCOUNTER — Ambulatory Visit: Payer: Medicare HMO | Admitting: Hematology

## 2017-07-03 NOTE — Progress Notes (Signed)
Marland Kitchen    HEMATOLOGY/ONCOLOGY CLINIC NOTE  Date of Service: 07/07/17   Patient Care Team: Carol Ada, MD as PCP - General (Family Medicine)  CHIEF COMPLAINTS/PURPOSE OF CONSULTATION:   F/u for continue mx of low grade NHL Iron def Anemia  HISTORY OF PRESENTING ILLNESS:   Sara Butler is a wonderful 84 y.o. female who has been referred to [place] by Dr .Carol Ada, MD  for evaluation and management of increasing lymphocytosis, anemia and thrombocytopenia.  Patient has a history of hypertension, dyslipidemia, paroxysmal atrial fibrillation on Coumadin, previous history of pulmonary embolism in 2013, history of iron deficiency due to chronic GI losses on oral iron therapy, GERD.  Patient had recent labs with her primary care physician on 05/01/2016 which showed total WBC count of 10.2k with 6.6k lymphocytes, mild anemia with hemoglobin of 11.4 and somewhat microcytic in dialysis with an MCV of 80 and an RDW of 19. She was also noted to have mild thrombocytopenia with a platelet count of 143k.  Patient had workup including getting TSH which is within normal limits. Noted to have significant B12 deficiency with a B12 level of 116. Has been started on oral B12 5000 g daily by her primary care physician. Recommended considering sublingual B12 replacement. Would need to rule out pernicious anemia.   Patient notes that she has been on oral iron replacement on and off.  She reports issues with what is thought to be irritable bowel syndrome for which she is on Myrbetriq.  Patient notes that she had some abdominal discomfort and had a CT of the abdomen and pelvis on 01/01/2016 which showed Prominent bilateral inguinal as well as gastrohepatic lymphadenopathy. These are new from the prior exam of 2014 in the gastrohepatic changes appear to be new from the recent exam from 06/20/2015.  Patient notes no fevers no chills no night sweats no significant unexpected weight loss. She denies any overt GI  bleeding. No other issues with overt nosebleeds gum bleeds or other sources of bleeding   INTERVAL HISTORY    Sara Butler is her for f/u on her lymphoproliferative disorder -low grade NHL NOS. She presents to the clinic today accompanied by her granddaughter. She notes she fell at this weekend a few days ago at the [employer] graduation. She fell on her legs. She was able to walk some and then she was able to get access to a wheelchair. She stayed home afterward. She notes later that night the back of her legs and her right shoulder. She notes she was able to recover the next day. She notes she felt better overall after her IV iron. She notes see has seen Dr. Watt Climes who does not want her to be on oral iron as this causes her to have diarrhea. She notes she is had hemorrhaging on Xarelto, she notes coumadin is the only thing that works for her although it affects her appetite and diet choices some..   On review of symptoms, pt she notes feeling better. She notes she gained 2 pounds. She notes when she wakes up in the morning her night gown will be wet or damp at times. She notes she is able to do the things she still wants to. She notes her weight overall is lower compared to before. She does not know what is keeping her from eating more.   Hgb is improved to11.6 from 9.5 with IV iron.  MEDICAL HISTORY:  Past Medical History:  Diagnosis Date  . Atrial fibrillation ([place])   .  Bleeding from anus   . Cervical cancer ([place])   . Diverticulitis of colon   . GERD (gastroesophageal reflux disease)   . Gout   . Hyperlipidemia   . Hypertension   . Pulmonary embolism ([facility])   Iron deficiency anemia due to chronic blood loss Paroxysmal atrial fibrillation on Coumadin Diverticulosis of the large and this time Vitamin D deficiency Previous history of pulmonary embolism in 2013 Overactive bladder Osteoarthritis of the knee Chronic back pain History of previous GI bleed in 2011 (while on pradaxa)  SURGICAL  HISTORY: Past Surgical History:  Procedure Laterality Date  . ABDOMINAL HYSTERECTOMY    . BACK SURGERY      SOCIAL HISTORY: Social History   Socioeconomic History  . Marital status: Widowed    Spouse name: Not on file  . Number of children: 4  . Years of education: 71  . Highest education level: Not on file  Occupational History  . Occupation: Retired Museum/gallery curator at [employer]  . Financial resource strain: Not on file  . Food insecurity:    Worry: Not on file    Inability: Not on file  . Transportation needs:    Medical: Not on file    Non-medical: Not on file  Tobacco Use  . Smoking status: Former Smoker    Packs/day: 0.10    Years: 58.00    Pack years: 5.80    Types: Cigarettes  . Smokeless tobacco: Never Used  Substance and Sexual Activity  . Alcohol use: No  . Drug use: No  . Sexual activity: Never  Lifestyle  . Physical activity:    Days per week: Not on file    Minutes per session: Not on file  . Stress: Not on file  Relationships  . Social connections:    Talks on phone: Not on file    Gets together: Not on file    Attends religious service: Not on file    Active member of club or organization: Not on file    Attends meetings of clubs or organizations: Not on file    Relationship status: Not on file  . Intimate partner violence:    Fear of current or ex partner: Not on file    Emotionally abused: Not on file    Physically abused: Not on file    Forced sexual activity: Not on file  Other Topics Concern  . Not on file  Social History Narrative   Widowed. Lives alone.  Normally independent of ADLs and ambulation.    FAMILY HISTORY: Family History  Problem Relation Age of Onset  . Heart failure Mother   . Diabetes Brother   . Cancer Neg Hx     ALLERGIES:  has No Known Allergies.  MEDICATIONS:  Current Outpatient Medications  Medication Sig Dispense Refill  . acetaminophen (TYLENOL) 500 MG tablet Take 500 mg by mouth every 6 (six)  hours as needed for headache.    Marland Kitchen atenolol (TENORMIN) 50 MG tablet Take 50 mg daily by mouth.     . Cyanocobalamin (B-12) 1000 MCG SUBL Place 1,000 mcg under the tongue daily. 30 each 3  . diclofenac sodium (VOLTAREN) 1 % GEL Apply 2 g topically daily as needed. For pain on legs    . omeprazole (PRILOSEC) 20 MG capsule Take 20 mg by mouth as needed.     . warfarin (COUMADIN) 5 MG tablet Take 5-7.5 mg daily at 6 PM by mouth.     . iron polysaccharides (  NIFEREX) 150 MG capsule Take 1 capsule (150 mg total) by mouth daily. (Patient not taking: Reported on 03/04/2017) 30 capsule 2   No current facility-administered medications for this visit.     REVIEW OF SYSTEMS:   .10 Point review of Systems was done is negative except as noted above.  PHYSICAL EXAMINATION: ECOG PERFORMANCE STATUS: 2 - Symptomatic, <50% confined to bed  Vitals:   07/07/17 0957  BP: (!) 166/81  Pulse: (!) 59  Resp: 17  Temp: 97.8 F (36.6 C)  SpO2: 99%   Filed Weights   07/07/17 0957  Weight: 116 lb 14.4 oz (53 kg)   .Body mass index is 19.45 kg/m. Marland Kitchen GENERAL:alert, in no acute distress and comfortable SKIN: no acute rashes, no significant lesions EYES: conjunctiva are pink and non-injected, sclera anicteric OROPHARYNX: MMM, no exudates, no oropharyngeal erythema or ulceration NECK: supple, no JVD LYMPH:  no palpable lymphadenopathy in the cervical, axillary or inguinal regions LUNGS: clear to auscultation b/l with normal respiratory effort HEART: regular rate & rhythm ABDOMEN:  normoactive bowel sounds , non tender, not distended. Extremity: no pedal edema PSYCH: alert & oriented x 3 with fluent speech NEURO: no focal motor/sensory deficits   LABORATORY DATA:  I have reviewed the data as listed  . CBC Latest Ref Rng & Units 07/11/2017 07/07/2017 05/01/2017  WBC 4.0 - 10.5 K/uL 11.7(H) 14.1(H) 13.0(H)  Hemoglobin 12.0 - 15.0 g/dL 11.6(L) 11.4(L) 9.5(L)  Hematocrit 36.0 - 46.0 % 37.6 33.4(L) 31.6(L)    Platelets 150 - 400 K/uL 128(L) 110(L) 130(L)    CBC    Component Value Date/Time   WBC 11.7 (H) 07/11/2017 1121   RBC 4.37 07/11/2017 1121   HGB 11.6 (L) 07/11/2017 1121   HGB 9.5 (L) 05/01/2017 0921   HGB 10.4 (L) 12/02/2016 0900   HCT 37.6 07/11/2017 1121   HCT 32.4 (L) 12/02/2016 0900   PLT 128 (L) 07/11/2017 1121   PLT 130 (L) 05/01/2017 0921   PLT 131 (L) 12/02/2016 0900   MCV 86.0 07/11/2017 1121   MCV 78.8 (L) 12/02/2016 0900   MCH 26.5 07/11/2017 1121   MCHC 30.9 07/11/2017 1121   RDW 19.9 (H) 07/11/2017 1121   RDW 19.1 (H) 12/02/2016 0900   LYMPHSABS 11.2 (H) 07/07/2017 0935   LYMPHSABS 10.6 (H) 12/02/2016 0900   MONOABS 0.6 07/07/2017 0935   MONOABS 1.0 (H) 12/02/2016 0900   EOSABS 0.1 07/07/2017 0935   EOSABS 0.2 12/02/2016 0900   BASOSABS 0.0 07/07/2017 0935   BASOSABS 0.0 12/02/2016 0900    CMP Latest Ref Rng & Units 07/07/2017 05/01/2017 03/04/2017  Glucose 70 - 140 mg/dL 79 90 94  BUN 7 - 26 mg/dL 14 12 16   Creatinine 0.60 - 1.10 mg/dL 0.86 0.99 1.07  Sodium 136 - 145 mmol/L 143 142 140  Potassium 3.5 - 5.1 mmol/L 3.0(LL) 4.1 4.4  Chloride 98 - 109 mmol/L 111(H) 109 109  CO2 22 - 29 mmol/L 25 25 24   Calcium 8.4 - 10.4 mg/dL 9.6 10.3 10.0  Total Protein 6.4 - 8.3 g/dL 7.5 7.7 7.5  Total Bilirubin 0.2 - 1.2 mg/dL 0.4 0.6 0.7  Alkaline Phos 40 - 150 U/L 60 65 64  AST 5 - 34 U/L 19 17 20   ALT 0 - 55 U/L 17 12 15    . Lab Results  Component Value Date   IRON 35 (L) 07/01/2016   TIBC 311 07/01/2016   IRONPCTSAT 11 (L) 07/01/2016   (Iron and TIBC)  Lab  Results  Component Value Date   VEHMCNOB 096 (H) 07/07/2017        RADIOGRAPHIC STUDIES: I have personally reviewed the radiological images as listed and agreed with the findings in the report.  PET/CT scan 08/18/2016 IMPRESSION: 1. Scattered primarily mild adenopathy most notable in the bilateral inguinal regions, but also with involvement of the left internal mammary chain, left axilla,  retroperitoneum, gastrohepatic ligament, and of a pericardial lymph node. Questionable involvement of a small right station 2 lymph node and at the right axilla. Much of this is Deauville 4 disease, with some Deauville 3 and of L2 disease as noted above. 2. Faintly accentuated activity in the spleen without focal activity or overt splenomegaly. This may merit surveillance. 3. There is some Deauville 3 level activity associated with mild soft tissue prominence of the labia majora. This could be simply incidental. 4. Other imaging findings of potential clinical significance: Aortic Atherosclerosis (ICD10-I70.0). Coronary atherosclerosis. Chronic left occipital lobe encephalomalacia. Hepatic cysts and potential scarring in the right hepatic lobe. Pancreas divisum. Chronic degenerative anterolisthesis at L4-5. Bony demineralization.   Electronically Signed   By: Van Clines M.D.   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 Concern for possible CD5 neg chronic lymphocytic leukemia versus low grade non-Hodgkin's lymphoma NOS  Patient did have some borderline abdominal and inguinal lymphadenopathy on her CT abdomen pelvis in November 2017. PET/CT 6/25 showed Scattered primarily mild adenopathy most notable in the bilateral inguinal regions, but also with involvement of the left internal mammary chain, left axilla, retroperitoneum, gastrohepatic ligament, and of a pericardial lymph node. Questionable involvement of a small right station 2 lymph node and at the right axilla. Much of this is Deauville 4 disease, with some Deauville 3 and of L2 disease as noted above. 2. Faintly accentuated activity in the spleen without focal activity or overt splenomegaly.  CLL FISH Prognostic panel -- did not  show any mutation typical for CLL. Less likely CD5 neg CLL US guided Bx of inguinal LN was done- however only limited lymphoid tissue was obtained and the biopsy is nondiagnostic. Patient had previously decided to hold off  On an excisional lymph node biopsy  #2 Anemia  - microcytic anemia with some element to iron deficiency.  Hg increased to 11.6 Ferritin level at 49 as of 12/02/16 and 41 on 05/01/17 Iron /ferritin levels much improved after IV iron . Lab Results  Component Value Date   IRON 35 (L) 07/01/2016   TIBC 311 07/01/2016   IRONPCTSAT 11 (L) 07/01/2016   (Iron and TIBC)  Lab Results  Component Value Date   FERRITIN 627 (H) 07/07/2017    #3 B12 deficiency. B12 levels are coming up from 116 now up to 295 to 722 with oral B12 replacement. No evidence of pernicious anemia based on neg antibody testing.  Results for MARETTA, OVERDORF (MRN [id number]) as of 07/03/2017 10:11  Ref. Range 09/18/2016 11:41 12/02/2016 09:00 05/01/2017 09:21  Vitamin B12 Latest Ref Range: 180 - 914 pg/mL 616 722 405   Plan  -She responded well to IV iron as her Hg has increased to 11.6.  -No acute indication for treatment of the patient's indolent non-Hodgkin's lymphoma at this time.  -I recommend she work on gaining weight by eating smaller, more frequent meals. I  suggested B complex vitamin to help her appetite.  -continue liq B12  sublingual preparation 1061mcg daily and  given a prescription to start taking after she runs out of her oral B12. Maintain B12 levels above 500. -ferritin >100. No indication for additional IV Iron at this time -Follow up in 3 months with labs -will plan to rpt scan in 6 months unless new symptoms prior to had.   #4 mild thrombocytopenic-  could be related to her NHL -Platelets slightly dropped to 110K today (07/07/17) -Will continue to monitor.   #5 Weight Loss . Wt Readings from Last 3 Encounters:  07/11/17 114 lb (51.7 kg)  07/07/17 116 lb 14.4 oz (53 kg)  05/01/17  119 lb 11.2 oz (54.3 kg)   -Referral to dietician to help with decreased appetite was previously sent and will continue to monitor for symptoms -I previously advised her to take ensure boost supplements -She will follow up with our dietician per pt request.    RTC with Dr Irene Limbo in 3 months with labs F/u with Ernestene Kiel-- patient has additional questions  All of the patients were answered with apparent satisfaction. The patient knows to call the clinic with any problems, questions or concerns.  The total time spent in the appointment was 25 minutes and more than 50% was on counseling and direct patient cares.   Sullivan Lone MD [place] AAHIVMS [facility] Hematology/Oncology Physician [facility]  (Office):       [phone] (Work cell):  [phone] (Fax):           [phone]  This document serves as a record of services personally performed by Sullivan Lone, MD. It was created on his behalf by Joslyn Devon, a trained medical scribe. The creation of this record is based on the scribe's personal observations and the provider's statements to them.    .I have reviewed the above documentation for accuracy and completeness, and I agree with the above.   Brunetta Genera MD MS

## 2017-07-07 ENCOUNTER — Ambulatory Visit: Payer: Medicare HMO | Admitting: Nutrition

## 2017-07-07 ENCOUNTER — Inpatient Hospital Stay (HOSPITAL_BASED_OUTPATIENT_CLINIC_OR_DEPARTMENT_OTHER): Payer: Medicare HMO | Admitting: Hematology

## 2017-07-07 ENCOUNTER — Inpatient Hospital Stay: Payer: Medicare HMO

## 2017-07-07 ENCOUNTER — Encounter: Payer: Self-pay | Admitting: Hematology

## 2017-07-07 ENCOUNTER — Inpatient Hospital Stay: Payer: Medicare HMO | Attending: Hematology

## 2017-07-07 ENCOUNTER — Telehealth: Payer: Self-pay | Admitting: Hematology and Oncology

## 2017-07-07 VITALS — BP 166/81 | HR 59 | Temp 97.8°F | Resp 17 | Ht 65.0 in | Wt 116.9 lb

## 2017-07-07 DIAGNOSIS — D509 Iron deficiency anemia, unspecified: Secondary | ICD-10-CM

## 2017-07-07 DIAGNOSIS — D696 Thrombocytopenia, unspecified: Secondary | ICD-10-CM

## 2017-07-07 DIAGNOSIS — I10 Essential (primary) hypertension: Secondary | ICD-10-CM | POA: Diagnosis not present

## 2017-07-07 DIAGNOSIS — Z7901 Long term (current) use of anticoagulants: Secondary | ICD-10-CM

## 2017-07-07 DIAGNOSIS — C8588 Other specified types of non-Hodgkin lymphoma, lymph nodes of multiple sites: Secondary | ICD-10-CM | POA: Diagnosis not present

## 2017-07-07 DIAGNOSIS — Z87891 Personal history of nicotine dependence: Secondary | ICD-10-CM | POA: Insufficient documentation

## 2017-07-07 DIAGNOSIS — Z86711 Personal history of pulmonary embolism: Secondary | ICD-10-CM | POA: Diagnosis not present

## 2017-07-07 DIAGNOSIS — C8308 Small cell B-cell lymphoma, lymph nodes of multiple sites: Secondary | ICD-10-CM

## 2017-07-07 DIAGNOSIS — I480 Paroxysmal atrial fibrillation: Secondary | ICD-10-CM

## 2017-07-07 DIAGNOSIS — D500 Iron deficiency anemia secondary to blood loss (chronic): Secondary | ICD-10-CM

## 2017-07-07 DIAGNOSIS — D649 Anemia, unspecified: Secondary | ICD-10-CM

## 2017-07-07 DIAGNOSIS — Z8541 Personal history of malignant neoplasm of cervix uteri: Secondary | ICD-10-CM | POA: Diagnosis not present

## 2017-07-07 DIAGNOSIS — E876 Hypokalemia: Secondary | ICD-10-CM

## 2017-07-07 LAB — CBC WITH DIFFERENTIAL/PLATELET
Basophils Absolute: 0 10*3/uL (ref 0.0–0.1)
Basophils Relative: 0 %
EOS PCT: 1 %
Eosinophils Absolute: 0.1 10*3/uL (ref 0.0–0.5)
HEMATOCRIT: 33.4 % — AB (ref 34.8–46.6)
Hemoglobin: 11.4 g/dL — ABNORMAL LOW (ref 11.6–15.9)
LYMPHS PCT: 80 %
Lymphs Abs: 11.2 10*3/uL — ABNORMAL HIGH (ref 0.9–3.3)
MCH: 29.5 pg (ref 25.1–34.0)
MCHC: 34.3 g/dL (ref 31.5–36.0)
MCV: 86.1 fL (ref 79.5–101.0)
MONO ABS: 0.6 10*3/uL (ref 0.1–0.9)
MONOS PCT: 4 %
NEUTROS ABS: 2.2 10*3/uL (ref 1.5–6.5)
Neutrophils Relative %: 15 %
PLATELETS: 110 10*3/uL — AB (ref 145–400)
RBC: 3.87 MIL/uL (ref 3.70–5.45)
RDW: 22.5 % — AB (ref 11.2–14.5)
WBC: 14.1 10*3/uL — ABNORMAL HIGH (ref 3.9–10.3)

## 2017-07-07 LAB — CMP (CANCER CENTER ONLY)
ALBUMIN: 3.5 g/dL (ref 3.5–5.0)
ALT: 17 U/L (ref 0–55)
AST: 19 U/L (ref 5–34)
Alkaline Phosphatase: 60 U/L (ref 40–150)
Anion gap: 7 (ref 3–11)
BUN: 14 mg/dL (ref 7–26)
CHLORIDE: 111 mmol/L — AB (ref 98–109)
CO2: 25 mmol/L (ref 22–29)
CREATININE: 0.86 mg/dL (ref 0.60–1.10)
Calcium: 9.6 mg/dL (ref 8.4–10.4)
GFR, EST NON AFRICAN AMERICAN: 59 mL/min — AB (ref >60)
GFR, Est AFR Am: >60 mL/min (ref >60)
Glucose, Bld: 79 mg/dL (ref 70–140)
POTASSIUM: 3 mmol/L — AB (ref 3.5–5.1)
SODIUM: 143 mmol/L (ref 136–145)
Total Bilirubin: 0.4 mg/dL (ref 0.2–1.2)
Total Protein: 7.5 g/dL (ref 6.4–8.3)

## 2017-07-07 LAB — RETICULOCYTES
RBC.: 3.89 MIL/uL (ref 3.70–5.45)
RETIC COUNT ABSOLUTE: 46.7 10*3/uL (ref 33.7–90.7)
Retic Ct Pct: 1.2 % (ref 0.7–2.1)

## 2017-07-07 LAB — LACTATE DEHYDROGENASE: LDH: 220 U/L (ref 125–245)

## 2017-07-07 LAB — FERRITIN: FERRITIN: 627 ng/mL — AB (ref 9–269)

## 2017-07-07 MED ORDER — POTASSIUM CHLORIDE CRYS ER 20 MEQ PO TBCR: 20.0000 meq | EXTENDED_RELEASE_TABLET | Freq: Two times a day (BID) | ORAL | 0 refills | Status: AC

## 2017-07-07 NOTE — Progress Notes (Signed)
Brief nutrition follow-up completed with patient who has B-cell lymphoma. Patient does not want to stay for nutrition appointment. She reports she only needs coupons for Ensure and boost. Current weight documented as 116.9 pounds on May 14 decreased from 120.9 pounds January 9. Patient denies nutrition impact symptoms.  Nutrition diagnosis: Unintended weight loss continues.  Intervention: Provided coupons for patient as requested. Recommended patient continue small frequent meals and snacks utilizing high-calorie high-protein foods. Recommended oral nutrition supplements in small amounts to avoid diarrhea.  Monitoring, evaluation, goals: Patient will work to increase calories and protein to minimize weight loss.  Next visit: Wednesday, August 14.  **Disclaimer: This note was dictated with voice recognition software. Similar sounding words can inadvertently be transcribed and this note may contain transcription errors which may not have been corrected upon publication of note.**

## 2017-07-07 NOTE — Telephone Encounter (Signed)
Appointments scheduled AVS/Calendar printed per 5/14 los °

## 2017-07-08 ENCOUNTER — Telehealth: Payer: Self-pay

## 2017-07-08 NOTE — Telephone Encounter (Signed)
Called pt home number, shared with pt daughter Rollene Fare, and explained that potassium was low yesterday with a result of 3.0. Potassium chloride 94mEq sent to [facility] on [address]. Pt instructed to p/u medication and take two times daily for ten days, and to schedule a f/u with PCP in 10 days to review potassium lab. Called [facility] on [address] and left message with Network engineer for Dr. Carol Ada notifying of low potassium, order sent for replacement, and request for pt to f/u with PCP to recheck lab value and manage.

## 2017-07-11 ENCOUNTER — Encounter (HOSPITAL_COMMUNITY): Payer: Self-pay | Admitting: *Deleted

## 2017-07-11 ENCOUNTER — Emergency Department (HOSPITAL_COMMUNITY)
Admission: EM | Admit: 2017-07-11 | Discharge: 2017-07-11 | Disposition: A | Discharge status: Home / Self Care | Payer: Medicare HMO | Attending: Emergency Medicine | Admitting: Emergency Medicine

## 2017-07-11 ENCOUNTER — Other Ambulatory Visit: Payer: Self-pay

## 2017-07-11 DIAGNOSIS — Z5321 Procedure and treatment not carried out due to patient leaving prior to being seen by health care provider: Secondary | ICD-10-CM | POA: Diagnosis not present

## 2017-07-11 DIAGNOSIS — I10 Essential (primary) hypertension: Secondary | ICD-10-CM | POA: Diagnosis not present

## 2017-07-11 DIAGNOSIS — R040 Epistaxis: Secondary | ICD-10-CM | POA: Diagnosis not present

## 2017-07-11 LAB — CBC
HEMATOCRIT: 37.6 % (ref 36.0–46.0)
HEMOGLOBIN: 11.6 g/dL — AB (ref 12.0–15.0)
MCH: 26.5 pg (ref 26.0–34.0)
MCHC: 30.9 g/dL (ref 30.0–36.0)
MCV: 86 fL (ref 78.0–100.0)
Platelets: 128 10*3/uL — ABNORMAL LOW (ref 150–400)
RBC: 4.37 MIL/uL (ref 3.87–5.11)
RDW: 19.9 % — ABNORMAL HIGH (ref 11.5–15.5)
WBC: 11.7 10*3/uL — ABNORMAL HIGH (ref 4.0–10.5)

## 2017-07-11 LAB — PROTIME-INR
INR: 9.64 — AB
Prothrombin Time: 76.9 seconds — ABNORMAL HIGH (ref 11.4–15.2)

## 2017-07-11 NOTE — ED Triage Notes (Addendum)
To ED for eval of blood in tissue when she blew her nose this am. States she has never in her life had this happen before. States she has had a HA since seeing this blood. No neuro deficits noted. Appears in nad. No bleeding from nose now. Pt is on coumadin.

## 2017-07-11 NOTE — ED Notes (Signed)
Labs reviewed, will upgrade acuity.

## 2017-07-11 NOTE — ED Notes (Signed)
Pt stated she needed to leave. Tried to convince otherwise. Charge notified and tried talking with pt as well. Pt refused to stay.

## 2017-07-16 DIAGNOSIS — E876 Hypokalemia: Secondary | ICD-10-CM | POA: Diagnosis not present

## 2017-07-16 DIAGNOSIS — Z7901 Long term (current) use of anticoagulants: Secondary | ICD-10-CM | POA: Diagnosis not present

## 2017-07-16 DIAGNOSIS — Z86711 Personal history of pulmonary embolism: Secondary | ICD-10-CM | POA: Diagnosis not present

## 2017-07-23 DIAGNOSIS — Z86711 Personal history of pulmonary embolism: Secondary | ICD-10-CM | POA: Diagnosis not present

## 2017-07-23 DIAGNOSIS — Z7901 Long term (current) use of anticoagulants: Secondary | ICD-10-CM | POA: Diagnosis not present

## 2017-08-04 DIAGNOSIS — Z7901 Long term (current) use of anticoagulants: Secondary | ICD-10-CM | POA: Diagnosis not present

## 2017-08-04 DIAGNOSIS — Z86711 Personal history of pulmonary embolism: Secondary | ICD-10-CM | POA: Diagnosis not present

## 2017-08-17 DIAGNOSIS — Z86711 Personal history of pulmonary embolism: Secondary | ICD-10-CM | POA: Diagnosis not present

## 2017-08-17 DIAGNOSIS — Z7901 Long term (current) use of anticoagulants: Secondary | ICD-10-CM | POA: Diagnosis not present

## 2017-08-19 DIAGNOSIS — K219 Gastro-esophageal reflux disease without esophagitis: Secondary | ICD-10-CM | POA: Diagnosis not present

## 2017-08-19 DIAGNOSIS — D500 Iron deficiency anemia secondary to blood loss (chronic): Secondary | ICD-10-CM | POA: Diagnosis not present

## 2017-09-07 DIAGNOSIS — Z7901 Long term (current) use of anticoagulants: Secondary | ICD-10-CM | POA: Diagnosis not present

## 2017-09-23 DIAGNOSIS — N3281 Overactive bladder: Secondary | ICD-10-CM | POA: Diagnosis not present

## 2017-09-23 DIAGNOSIS — I480 Paroxysmal atrial fibrillation: Secondary | ICD-10-CM | POA: Diagnosis not present

## 2017-09-23 DIAGNOSIS — R609 Edema, unspecified: Secondary | ICD-10-CM | POA: Diagnosis not present

## 2017-09-23 DIAGNOSIS — Z7901 Long term (current) use of anticoagulants: Secondary | ICD-10-CM | POA: Diagnosis not present

## 2017-10-06 NOTE — Progress Notes (Signed)
Marland Kitchen    HEMATOLOGY/ONCOLOGY CLINIC NOTE  Date of Service: 10/07/17   Patient Care Team: Carol Ada, MD as PCP - General (Family Medicine)  CHIEF COMPLAINTS/PURPOSE OF CONSULTATION:   F/u for continue mx of low grade NHL Iron def Anemia  HISTORY OF PRESENTING ILLNESS:   Sara Butler is a wonderful 84 y.o. female who has been referred to [place] by Dr .Carol Ada, MD  for evaluation and management of increasing lymphocytosis, anemia and thrombocytopenia.  Patient has a history of hypertension, dyslipidemia, paroxysmal atrial fibrillation on Coumadin, previous history of pulmonary embolism in 2013, history of iron deficiency due to chronic GI losses on oral iron therapy, GERD.  Patient had recent labs with her primary care physician on 05/01/2016 which showed total WBC count of 10.2k with 6.6k lymphocytes, mild anemia with hemoglobin of 11.4 and somewhat microcytic in dialysis with an MCV of 80 and an RDW of 19. She was also noted to have mild thrombocytopenia with a platelet count of 143k.  Patient had workup including getting TSH which is within normal limits. Noted to have significant B12 deficiency with a B12 level of 116. Has been started on oral B12 5000 g daily by her primary care physician. Recommended considering sublingual B12 replacement. Would need to rule out pernicious anemia.   Patient notes that she has been on oral iron replacement on and off.  She reports issues with what is thought to be irritable bowel syndrome for which she is on Myrbetriq.  Patient notes that she had some abdominal discomfort and had a CT of the abdomen and pelvis on 01/01/2016 which showed Prominent bilateral inguinal as well as gastrohepatic lymphadenopathy. These are new from the prior exam of 2014 in the gastrohepatic changes appear to be new from the recent exam from 06/20/2015.  Patient notes no fevers no chills no night sweats no significant unexpected weight loss. She denies any overt GI  bleeding. No other issues with overt nosebleeds gum bleeds or other sources of bleeding   INTERVAL HISTORY:     Sara Butler is her for f/u on her lymphoproliferative disorder -low grade NHL NOS. The patient's last visit with [place] was on 07/07/17. She is accompanied today by her granddaughter. The pt reports that she is doing well overall.   The pt reports that she has done her best to eat better and has gained three pounds in the interim. She has begun probiotics in the interim under the management of her GI Dr. Clarene Essex.   The pt notes that she has not developed any constitutional symptoms.   Lab results today (10/07/17) of CBC w/diff, CMP is as follows: all values are WNL except for WBC at 12.3k, HCT at 34.7, RDW at 15.8, PLT at 120k, Lymphs abs at 9.7k, Albumin at 3.3. LDH 10/07/17 is 200 Ferritin 10/07/17 is WNL  On review of systems, pt reports stable energy levels, rare lower back pain, increased weightand denies fevers, chills, night sweats, unexpected weight loss, headaches, abdominal pain, and any other symptoms.   MEDICAL HISTORY:  Past Medical History:  Diagnosis Date  . Atrial fibrillation ([place])   . Bleeding from anus   . Cervical cancer ([place])   . Diverticulitis of colon   . GERD (gastroesophageal reflux disease)   . Gout   . Hyperlipidemia   . Hypertension   . Pulmonary embolism ([facility])   Iron deficiency anemia due to chronic blood loss Paroxysmal atrial fibrillation on Coumadin Diverticulosis of the large and  this time Vitamin D deficiency Previous history of pulmonary embolism in 2013 Overactive bladder Osteoarthritis of the knee Chronic back pain History of previous GI bleed in 2011 (while on pradaxa)  SURGICAL HISTORY: Past Surgical History:  Procedure Laterality Date  . ABDOMINAL HYSTERECTOMY    . BACK SURGERY      SOCIAL HISTORY: Social History   Socioeconomic History  . Marital status: Widowed    Spouse name: Not on file  . Number of children: 4    . Years of education: 38  . Highest education level: Not on file  Occupational History  . Occupation: Retired Museum/gallery curator at [employer]  . Financial resource strain: Not on file  . Food insecurity:    Worry: Not on file    Inability: Not on file  . Transportation needs:    Medical: Not on file    Non-medical: Not on file  Tobacco Use  . Smoking status: Former Smoker    Packs/day: 0.10    Years: 58.00    Pack years: 5.80    Types: Cigarettes  . Smokeless tobacco: Never Used  Substance and Sexual Activity  . Alcohol use: No  . Drug use: No  . Sexual activity: Never  Lifestyle  . Physical activity:    Days per week: Not on file    Minutes per session: Not on file  . Stress: Not on file  Relationships  . Social connections:    Talks on phone: Not on file    Gets together: Not on file    Attends religious service: Not on file    Active member of club or organization: Not on file    Attends meetings of clubs or organizations: Not on file    Relationship status: Not on file  . Intimate partner violence:    Fear of current or ex partner: Not on file    Emotionally abused: Not on file    Physically abused: Not on file    Forced sexual activity: Not on file  Other Topics Concern  . Not on file  Social History Narrative   Widowed. Lives alone.  Normally independent of ADLs and ambulation.    FAMILY HISTORY: Family History  Problem Relation Age of Onset  . Heart failure Mother   . Diabetes Brother   . Cancer Neg Hx     ALLERGIES:  has No Known Allergies.  MEDICATIONS:  Current Outpatient Medications  Medication Sig Dispense Refill  . acetaminophen (TYLENOL) 500 MG tablet Take 500 mg by mouth every 6 (six) hours as needed for headache.    Marland Kitchen atenolol (TENORMIN) 50 MG tablet Take 50 mg daily by mouth.     . Cyanocobalamin (B-12) 1000 MCG SUBL Place 1,000 mcg under the tongue daily. 30 each 3  . diclofenac sodium (VOLTAREN) 1 % GEL Apply 2 g topically daily  as needed. For pain on legs    . iron polysaccharides (NIFEREX) 150 MG capsule Take 1 capsule (150 mg total) by mouth daily. (Patient not taking: Reported on 03/04/2017) 30 capsule 2  . omeprazole (PRILOSEC) 20 MG capsule Take 20 mg by mouth as needed.     . potassium chloride SA (K-DUR,KLOR-CON) 20 MEQ tablet Take 1 tablet (20 mEq total) by mouth 2 (two) times daily for 10 days. 20 tablet 0  . warfarin (COUMADIN) 5 MG tablet Take 5-7.5 mg daily at 6 PM by mouth.      No current facility-administered medications for this visit.  REVIEW OF SYSTEMS:    A 10+ POINT REVIEW OF SYSTEMS WAS OBTAINED including neurology, dermatology, psychiatry, cardiac, respiratory, lymph, extremities, GI, GU, Musculoskeletal, constitutional, breasts, reproductive, HEENT.  All pertinent positives are noted in the HPI.  All others are negative.   PHYSICAL EXAMINATION: ECOG PERFORMANCE STATUS: 2 - Symptomatic, <50% confined to bed  Vitals:   10/07/17 0930  BP: (!) 167/98  Pulse: 62  Resp: 17  Temp: 98.1 F (36.7 C)  SpO2: 100%   Filed Weights   10/07/17 0930  Weight: 119 lb 1.6 oz (54 kg)   .Body mass index is 19.82 kg/m.  GENERAL:alert, in no acute distress and comfortable SKIN: no acute rashes, no significant lesions EYES: conjunctiva are pink and non-injected, sclera anicteric OROPHARYNX: MMM, no exudates, no oropharyngeal erythema or ulceration NECK: supple, no JVD LYMPH:  no palpable lymphadenopathy in the cervical, axillary or inguinal regions LUNGS: clear to auscultation b/l with normal respiratory effort HEART: regular rate & rhythm ABDOMEN:  normoactive bowel sounds , non tender, not distended. No palpable hepatosplenomegaly.  Extremity: no pedal edema PSYCH: alert & oriented x 3 with fluent speech NEURO: no focal motor/sensory deficits   LABORATORY DATA:  I have reviewed the data as listed  . CBC Latest Ref Rng & Units 10/07/2017 07/11/2017 07/07/2017  WBC 3.9 - 10.3 K/uL 12.3(H)  11.7(H) 14.1(H)  Hemoglobin 11.6 - 15.9 g/dL 11.8 11.6(L) 11.4(L)  Hematocrit 34.8 - 46.6 % 34.7(L) 37.6 33.4(L)  Platelets 145 - 400 K/uL 120(L) 128(L) 110(L)    CBC    Component Value Date/Time   WBC 12.3 (H) 10/07/2017 0908   RBC 3.91 10/07/2017 0908   HGB 11.8 10/07/2017 0908   HGB 9.5 (L) 05/01/2017 0921   HGB 10.4 (L) 12/02/2016 0900   HCT 34.7 (L) 10/07/2017 0908   HCT 32.4 (L) 12/02/2016 0900   PLT 120 (L) 10/07/2017 0908   PLT 130 (L) 05/01/2017 0921   PLT 131 (L) 12/02/2016 0900   MCV 88.6 10/07/2017 0908   MCV 78.8 (L) 12/02/2016 0900   MCH 30.1 10/07/2017 0908   MCHC 33.9 10/07/2017 0908   RDW 15.8 (H) 10/07/2017 0908   RDW 19.1 (H) 12/02/2016 0900   LYMPHSABS 9.7 (H) 10/07/2017 0908   LYMPHSABS 10.6 (H) 12/02/2016 0900   MONOABS 0.2 10/07/2017 0908   MONOABS 1.0 (H) 12/02/2016 0900   EOSABS 0.2 10/07/2017 0908   EOSABS 0.2 12/02/2016 0900   BASOSABS 0.0 10/07/2017 0908   BASOSABS 0.0 12/02/2016 0900    CMP Latest Ref Rng & Units 10/07/2017 07/07/2017 05/01/2017  Glucose 70 - 99 mg/dL 83 79 90  BUN 8 - 23 mg/dL 10 14 12   Creatinine 0.44 - 1.00 mg/dL 0.83 0.86 0.99  Sodium 135 - 145 mmol/L 143 143 142  Potassium 3.5 - 5.1 mmol/L 4.0 3.0(LL) 4.1  Chloride 98 - 111 mmol/L 110 111(H) 109  CO2 22 - 32 mmol/L 23 25 25   Calcium 8.9 - 10.3 mg/dL 10.0 9.6 10.3  Total Protein 6.5 - 8.1 g/dL 7.5 7.5 7.7  Total Bilirubin 0.3 - 1.2 mg/dL 0.8 0.4 0.6  Alkaline Phos 38 - 126 U/L 71 60 65  AST 15 - 41 U/L 19 19 17   ALT 0 - 44 U/L 14 17 12    . Lab Results  Component Value Date   IRON 46 10/07/2017   TIBC 226 (L) 10/07/2017   IRONPCTSAT 20 (L) 10/07/2017   (Iron and TIBC)  Lab Results  Component Value Date  FERRITIN 393 (H) 10/07/2017        RADIOGRAPHIC STUDIES: I have personally reviewed the radiological images as listed and agreed with the findings in the report.  PET/CT scan 08/18/2016 IMPRESSION: 1. Scattered primarily mild adenopathy most notable  in the bilateral inguinal regions, but also with involvement of the left internal mammary chain, left axilla, retroperitoneum, gastrohepatic ligament, and of a pericardial lymph node. Questionable involvement of a small right station 2 lymph node and at the right axilla. Much of this is Deauville 4 disease, with some Deauville 3 and of L2 disease as noted above. 2. Faintly accentuated activity in the spleen without focal activity or overt splenomegaly. This may merit surveillance.  3. There is some Deauville 3 level activity associated with mild soft tissue prominence of the labia majora. This could be simply incidental. 4. Other imaging findings of potential clinical significance: Aortic Atherosclerosis (ICD10-I70.0). Coronary atherosclerosis. Chronic left occipital lobe encephalomalacia. Hepatic cysts and potential scarring in the right hepatic lobe. Pancreas divisum. Chronic degenerative anterolisthesis at L4-5. Bony demineralization.   Electronically Signed   By: Van Clines M.D.   On: 08/18/2016 13:09   ASSESSMENT & PLAN:   84 y.o. female with multiple medical comorbidities with  #1 Low grade Non Hodgkins B cell lymphoma NOS  Flow cytometry suggestive of Cd20+ CD5-CD 10 neg Low grade Non Hodgkins lymphoma. Concern for possible CD5 neg chronic lymphocytic leukemia versus low grade non-Hodgkin's lymphoma NOS  Patient did have some borderline abdominal and inguinal lymphadenopathy on her CT abdomen pelvis in November 2017.  PET/CT 6/25 showed Scattered primarily mild adenopathy most notable in the bilateral inguinal regions, but also with involvement of the left internal mammary chain, left axilla, retroperitoneum, gastrohepatic ligament, and of a pericardial lymph node. Questionable involvement of a small right station 2 lymph node and at the right axilla. Much of this is Deauville 4 disease, with some Deauville 3 and of L2 disease as noted above. 2. Faintly accentuated activity  in the spleen without focal activity or overt splenomegaly.  CLL FISH Prognostic panel -- did not show any mutation typical for CLL. Less likely CD5 neg CLL US guided Bx of inguinal LN was done- however only limited lymphoid tissue was obtained and the biopsy is nondiagnostic. Patient had previously decided to hold off  On an excisional lymph node biopsy  #2 Anemia  - microcytic anemia with some element to iron deficiency.  Hg increased to 11.8 Ferritin level at 49 as of 12/02/16 and 41 on 05/01/17 Iron /ferritin levels much improved after IV iron . Lab Results  Component Value Date   IRON 46 10/07/2017   TIBC 226 (L) 10/07/2017   IRONPCTSAT 20 (L) 10/07/2017   (Iron and TIBC)  Lab Results  Component Value Date   FERRITIN 393 (H) 10/07/2017    #3 B12 deficiency. B12 levels are coming up from 116 now up to 295 to 722 with oral B12 replacement. No evidence of pernicious anemia based on neg antibody testing. B12 - 405 in 04/2017  PLAN:   -I recommend she continue to work on gaining weight by eating smaller, more frequent meals. - I suggested B complex vitamin supplementation.  -continue liq B12  sublingual preparation 1052mcg daily and  given a prescription to start taking after she runs out of her oral B12. Maintain B12 levels above 500. -ferritin >100. No indication for additional IV Iron at this time -will plan to rpt scan in 6 months unless new symptoms prior to  had. -Discussed pt labwork today, 10/07/17; blood counts are stable and improved, no anemia, lymphocytes decreased to 9.7k and blood chemistries are improved -Recommended that the pt address her medication management questions with her PCP Dr. Carol Ada -The pt shows no clinical or lab progression of her lymphoma at this time. No constitutional symptoms at this time.  -No indication for initiating active treatment at this time.   -Will see the pt back in 3 months, sooner if any new concerns or symptoms   #4 Mild  thrombocytopenia-  could be related to her NHL -Will continue to monitor.   #5 Weight Loss . Wt Readings from Last 3 Encounters:  10/07/17 119 lb 1.6 oz (54 kg)  07/11/17 114 lb (51.7 kg)  07/07/17 116 lb 14.4 oz (53 kg)   -Referral to dietician to help with decreased appetite was previously sent and will continue to monitor for symptoms -I previously advised her to take ensure boost supplements -She will follow up with our dietician per pt request.    RTC with Dr Irene Limbo in 3 months    All of the patients were answered with apparent satisfaction. The patient knows to call the clinic with any problems, questions or concerns.  The total time spent in the appt was 25 minutes and more than 50% was on counseling and direct patient cares.   Sullivan Lone MD MS AAHIVMS [facility] Hematology/Oncology Physician [facility]  (Office):       [phone] (Work cell):  [phone] (Fax):           [phone]  I, Baldwin Jamaica, am acting as a scribe for Dr. Irene Limbo  .I have reviewed the above documentation for accuracy and completeness, and I agree with the above. Brunetta Genera MD

## 2017-10-07 ENCOUNTER — Telehealth: Payer: Self-pay | Admitting: Hematology

## 2017-10-07 ENCOUNTER — Inpatient Hospital Stay: Payer: Medicare HMO | Attending: Hematology

## 2017-10-07 ENCOUNTER — Inpatient Hospital Stay: Payer: Medicare HMO | Admitting: Nutrition

## 2017-10-07 ENCOUNTER — Inpatient Hospital Stay (HOSPITAL_BASED_OUTPATIENT_CLINIC_OR_DEPARTMENT_OTHER): Payer: Medicare HMO | Admitting: Hematology

## 2017-10-07 VITALS — BP 167/98 | HR 62 | Temp 98.1°F | Resp 17 | Ht 65.0 in | Wt 119.1 lb

## 2017-10-07 DIAGNOSIS — D509 Iron deficiency anemia, unspecified: Secondary | ICD-10-CM | POA: Insufficient documentation

## 2017-10-07 DIAGNOSIS — C8308 Small cell B-cell lymphoma, lymph nodes of multiple sites: Secondary | ICD-10-CM

## 2017-10-07 DIAGNOSIS — D500 Iron deficiency anemia secondary to blood loss (chronic): Secondary | ICD-10-CM

## 2017-10-07 DIAGNOSIS — D696 Thrombocytopenia, unspecified: Secondary | ICD-10-CM | POA: Diagnosis not present

## 2017-10-07 DIAGNOSIS — C8518 Unspecified B-cell lymphoma, lymph nodes of multiple sites: Secondary | ICD-10-CM | POA: Insufficient documentation

## 2017-10-07 DIAGNOSIS — R634 Abnormal weight loss: Secondary | ICD-10-CM

## 2017-10-07 DIAGNOSIS — D649 Anemia, unspecified: Secondary | ICD-10-CM

## 2017-10-07 DIAGNOSIS — E538 Deficiency of other specified B group vitamins: Secondary | ICD-10-CM

## 2017-10-07 LAB — IRON AND TIBC
IRON: 46 ug/dL (ref 41–142)
Saturation Ratios: 20 % — ABNORMAL LOW (ref 21–57)
TIBC: 226 ug/dL — ABNORMAL LOW (ref 236–444)
UIBC: 181 ug/dL

## 2017-10-07 LAB — CBC WITH DIFFERENTIAL/PLATELET
BASOS PCT: 0 %
Basophils Absolute: 0 10*3/uL (ref 0.0–0.1)
EOS ABS: 0.2 10*3/uL (ref 0.0–0.5)
Eosinophils Relative: 1 %
HEMATOCRIT: 34.7 % — AB (ref 34.8–46.6)
Hemoglobin: 11.8 g/dL (ref 11.6–15.9)
LYMPHS ABS: 9.7 10*3/uL — AB (ref 0.9–3.3)
Lymphocytes Relative: 79 %
MCH: 30.1 pg (ref 25.1–34.0)
MCHC: 33.9 g/dL (ref 31.5–36.0)
MCV: 88.6 fL (ref 79.5–101.0)
MONO ABS: 0.2 10*3/uL (ref 0.1–0.9)
MONOS PCT: 2 %
Neutro Abs: 2.2 10*3/uL (ref 1.5–6.5)
Neutrophils Relative %: 18 %
Platelets: 120 10*3/uL — ABNORMAL LOW (ref 145–400)
RBC: 3.91 MIL/uL (ref 3.70–5.45)
RDW: 15.8 % — AB (ref 11.2–14.5)
WBC: 12.3 10*3/uL — ABNORMAL HIGH (ref 3.9–10.3)

## 2017-10-07 LAB — CMP (CANCER CENTER ONLY)
ALBUMIN: 3.3 g/dL — AB (ref 3.5–5.0)
ALK PHOS: 71 U/L (ref 38–126)
ALT: 14 U/L (ref 0–44)
ANION GAP: 10 (ref 5–15)
AST: 19 U/L (ref 15–41)
BILIRUBIN TOTAL: 0.8 mg/dL (ref 0.3–1.2)
BUN: 10 mg/dL (ref 8–23)
CALCIUM: 10 mg/dL (ref 8.9–10.3)
CO2: 23 mmol/L (ref 22–32)
Chloride: 110 mmol/L (ref 98–111)
Creatinine: 0.83 mg/dL (ref 0.44–1.00)
GFR, Est AFR Am: >60 mL/min (ref >60)
GFR, Estimated: >60 mL/min (ref >60)
GLUCOSE: 83 mg/dL (ref 70–99)
Potassium: 4 mmol/L (ref 3.5–5.1)
SODIUM: 143 mmol/L (ref 135–145)
TOTAL PROTEIN: 7.5 g/dL (ref 6.5–8.1)

## 2017-10-07 LAB — LACTATE DEHYDROGENASE: LDH: 200 U/L — ABNORMAL HIGH (ref 98–192)

## 2017-10-07 LAB — FERRITIN: Ferritin: 393 ng/mL — ABNORMAL HIGH (ref 11–307)

## 2017-10-07 NOTE — Progress Notes (Signed)
Nutrition follow-up completed with patient status post MD visit for B-cell lymphoma. Patient's weight has improved and was documented as 119.1 pounds on August 14.   This is an increased from 116.9 pounds May 14. Patient reports she has improved appetite and has been eating more often. She has been drinking oral nutrition supplements. She denies nausea, vomiting, constipation, and diarrhea.  Nutrition diagnosis: Unintended weight loss improved.  Intervention: Provided patient with complementary case of Ensure Enlive. Recommended patient continue oral nutrition supplements between meals as tolerated. Provided support and encouragement for patient to continue eating high-calorie, high-protein foods. Questions were answered.  Teach back method used.  Monitoring, evaluation, goals: Patient will tolerate adequate calories and protein to minimize weight loss.  Next visit: To be scheduled as needed.  **Disclaimer: This note was dictated with voice recognition software. Similar sounding words can inadvertently be transcribed and this note may contain transcription errors which may not have been corrected upon publication of note.**

## 2017-10-07 NOTE — Telephone Encounter (Signed)
Appts scheduled AVS/Calendar printed per 8/14 los °

## 2017-10-23 ENCOUNTER — Emergency Department (HOSPITAL_COMMUNITY)
Admission: EM | Admit: 2017-10-23 | Discharge: 2017-10-23 | Disposition: A | Discharge status: Home / Self Care | Payer: Medicare HMO | Attending: Emergency Medicine | Admitting: Emergency Medicine

## 2017-10-23 ENCOUNTER — Encounter (HOSPITAL_COMMUNITY): Payer: Self-pay | Admitting: Emergency Medicine

## 2017-10-23 ENCOUNTER — Other Ambulatory Visit: Payer: Self-pay

## 2017-10-23 DIAGNOSIS — Z87891 Personal history of nicotine dependence: Secondary | ICD-10-CM | POA: Diagnosis not present

## 2017-10-23 DIAGNOSIS — K625 Hemorrhage of anus and rectum: Secondary | ICD-10-CM

## 2017-10-23 DIAGNOSIS — R791 Abnormal coagulation profile: Secondary | ICD-10-CM | POA: Insufficient documentation

## 2017-10-23 DIAGNOSIS — I10 Essential (primary) hypertension: Secondary | ICD-10-CM | POA: Insufficient documentation

## 2017-10-23 DIAGNOSIS — Z79899 Other long term (current) drug therapy: Secondary | ICD-10-CM | POA: Diagnosis not present

## 2017-10-23 DIAGNOSIS — Z7901 Long term (current) use of anticoagulants: Secondary | ICD-10-CM | POA: Diagnosis not present

## 2017-10-23 DIAGNOSIS — Z86711 Personal history of pulmonary embolism: Secondary | ICD-10-CM | POA: Insufficient documentation

## 2017-10-23 LAB — CBC
HEMATOCRIT: 36 % (ref 36.0–46.0)
HEMOGLOBIN: 11.2 g/dL — AB (ref 12.0–15.0)
MCH: 28.6 pg (ref 26.0–34.0)
MCHC: 31.1 g/dL (ref 30.0–36.0)
MCV: 91.8 fL (ref 78.0–100.0)
Platelets: 124 10*3/uL — ABNORMAL LOW (ref 150–400)
RBC: 3.92 MIL/uL (ref 3.87–5.11)
RDW: 14.9 % (ref 11.5–15.5)
WBC: 14.4 10*3/uL — ABNORMAL HIGH (ref 4.0–10.5)

## 2017-10-23 LAB — COMPREHENSIVE METABOLIC PANEL
ALBUMIN: 3.4 g/dL — AB (ref 3.5–5.0)
ALT: 19 U/L (ref 0–44)
ANION GAP: 8 (ref 5–15)
AST: 24 U/L (ref 15–41)
Alkaline Phosphatase: 64 U/L (ref 38–126)
BUN: 16 mg/dL (ref 8–23)
CHLORIDE: 109 mmol/L (ref 98–111)
CO2: 25 mmol/L (ref 22–32)
Calcium: 10.3 mg/dL (ref 8.9–10.3)
Creatinine, Ser: 0.9 mg/dL (ref 0.44–1.00)
GFR calc Af Amer: >60 mL/min (ref >60)
GFR calc non Af Amer: 56 mL/min — ABNORMAL LOW (ref >60)
GLUCOSE: 97 mg/dL (ref 70–99)
POTASSIUM: 4.3 mmol/L (ref 3.5–5.1)
Sodium: 142 mmol/L (ref 135–145)
Total Bilirubin: 0.6 mg/dL (ref 0.3–1.2)
Total Protein: 7.9 g/dL (ref 6.5–8.1)

## 2017-10-23 LAB — TYPE AND SCREEN
ABO/RH(D): O POS
Antibody Screen: NEGATIVE

## 2017-10-23 LAB — PROTIME-INR
INR: 1.3
PROTHROMBIN TIME: 16.1 s — AB (ref 11.4–15.2)

## 2017-10-23 LAB — ABO/RH: ABO/RH(D): O POS

## 2017-10-23 MED ORDER — WARFARIN SODIUM 10 MG PO TABS
10.0000 mg | ORAL_TABLET | Freq: Once | ORAL | Status: AC
  Administered 2017-10-23: 10 mg via ORAL
  Filled 2017-10-23: qty 1

## 2017-10-23 NOTE — ED Notes (Signed)
Pt stated that she is "not hemorrhaging from my rectum or in my stool, but from my vagina"

## 2017-10-23 NOTE — Discharge Instructions (Addendum)
If you have any more bleeding in the next 2 to 3 days you must return to ER immediately As we discussed, please continue to take your Coumadin and follow-up next week for recheck of INR level

## 2017-10-23 NOTE — ED Triage Notes (Signed)
Pt has a hx of lymphoma. Woke tonight and states "I was hemorrhaging from my rectum"  Pt is A & O in NAD. Ambulatory, independent.  Lives with daughter.

## 2017-10-23 NOTE — ED Provider Notes (Signed)
[facility] EMERGENCY DEPARTMENT Provider Note   CSN: [id number] Arrival date & time: 10/23/17  9326     History   Chief Complaint Chief Complaint  Patient presents with  . Rectal Bleeding    HPI Sara Butler is a 84 y.o. female.  The history is provided by the patient.  Rectal Bleeding  Quality:  Unable to specify Chronicity:  New Relieved by:  None tried Worsened by:  Nothing Associated symptoms: no abdominal pain, no fever and no vomiting   Risk factors: anticoagulant use   Pt presents for possible rectal bleeding. She reports several hours ago she went to use the restroom and she noted blood while wiping.  At first she thought it was rectal bleeding, but now she feels it may be vaginal bleeding. She now feels at baseline.  She denies abdominal pain, or dizziness.  No vomiting blood.  No hematuria. She does take Coumadin for atrial fibrillation Past Medical History:  Diagnosis Date  . Atrial fibrillation ([place])   . Bleeding from anus   . Cervical cancer ([place])   . Diverticulitis of colon   . GERD (gastroesophageal reflux disease)   . Gout   . Hyperlipidemia   . Hypertension   . Pulmonary embolism [facility])     Patient Active Problem List   Diagnosis Date Noted  . Iron deficiency anemia 05/02/2017  . Chest pain 07/04/2014  . Pseudogout of right wrist 07/04/2014  . Acute gout   . Pulmonary emboli ([place]) 04/26/2011  . Hypertension   . Atrial fibrillation ([place])   . Hyperlipidemia   . GERD (gastroesophageal reflux disease)     Past Surgical History:  Procedure Laterality Date  . ABDOMINAL HYSTERECTOMY    . BACK SURGERY       OB History   None      Home Medications    Prior to Admission medications   Medication Sig Start Date End Date Taking? Authorizing Provider  acetaminophen (TYLENOL) 500 MG tablet Take 500 mg by mouth every 6 (six) hours as needed for headache.   Yes [provider]  atenolol (TENORMIN) 50 MG tablet Take  50 mg daily by mouth.    Yes [provider]  Cyanocobalamin (B-12) 1000 MCG SUBL Place 1,000 mcg under the tongue daily. 03/04/17  Yes Brunetta Genera, MD  diclofenac sodium (VOLTAREN) 1 % GEL Apply 2 g topically daily as needed (pain).    Yes [provider]  iron polysaccharides (NIFEREX) 150 MG capsule Take 1 capsule (150 mg total) by mouth daily. 03/04/17  Yes Brunetta Genera, MD  warfarin (COUMADIN) 5 MG tablet Take 5-7.5 mg by mouth daily. Take 5 mg on Monday and Tuesday, all other days take 7.5 mg   Yes [provider]  potassium chloride SA (K-DUR,KLOR-CON) 20 MEQ tablet Take 1 tablet (20 mEq total) by mouth 2 (two) times daily for 10 days. Patient not taking: Reported on 10/23/2017 07/07/17 07/17/17  Brunetta Genera, MD    Family History Family History  Problem Relation Age of Onset  . Heart failure Mother   . Diabetes Brother   . Cancer Neg Hx     Social History Social History   Tobacco Use  . Smoking status: Former Smoker    Packs/day: 0.10    Years: 58.00    Pack years: 5.80    Types: Cigarettes  . Smokeless tobacco: Never Used  Substance Use Topics  . Alcohol use: No  . Drug  use: No     Allergies   Patient has no known allergies.   Review of Systems Review of Systems  Constitutional: Negative for fever.  Gastrointestinal: Positive for hematochezia. Negative for abdominal pain and vomiting.  Genitourinary: Negative for hematuria.  All other systems reviewed and are negative.    Physical Exam Updated Vital Signs BP (!) 149/67 (BP Location: Right Arm)   Pulse 64   Temp 98.3 F (36.8 C) (Oral)   Resp 16   SpO2 100%   Physical Exam   CONSTITUTIONAL: Elderly, no acute distress HEAD: Normocephalic/atraumatic EYES: EOMI/PERRL conjunctival pink ENMT: Mucous membranes moist NECK: supple no meningeal signs SPINE/BACK:entire spine nontender CV: S1/S2 noted, no murmurs/rubs/gallops noted LUNGS: Lungs are clear to  auscultation bilaterally, no apparent distress ABDOMEN: soft, nontender, no rebound or guarding, bowel sounds noted throughout abdomen Rectal-Stool is brown, no blood or melena is noted.  Female chaperone present for exam GU:no cva tenderness No vaginal bleeding or lacerations noted.  Female chaperone present for exam NEURO: Pt is awake/alert/appropriate, moves all extremitiesx4.  No facial droop.   EXTREMITIES: pulses normal/equal, full ROM SKIN: warm, color normal PSYCH: no abnormalities of mood noted, alert and oriented to situation  ED Treatments / Results  Labs (all labs ordered are listed, but only abnormal results are displayed) Labs Reviewed  COMPREHENSIVE METABOLIC PANEL - Abnormal; Notable for the following components:      Result Value   Albumin 3.4 (*)    GFR calc non Af Amer 56 (*)    All other components within normal limits  CBC - Abnormal; Notable for the following components:   WBC 14.4 (*)    Hemoglobin 11.2 (*)    Platelets 124 (*)    All other components within normal limits  PROTIME-INR - Abnormal; Notable for the following components:   Prothrombin Time 16.1 (*)    All other components within normal limits  TYPE AND SCREEN  ABO/RH    EKG None  Radiology No results found.  Procedures Procedures  Medications Ordered in ED Medications  warfarin (COUMADIN) tablet 10 mg (has no administration in time range)     Initial Impression / Assessment and Plan / ED Course  I have reviewed the triage vital signs and the nursing notes.  Pertinent labs results that were available during my care of the patient were reviewed by me and considered in my medical decision making (see chart for details).     Presents with episode of bleeding of unclear etiology.  On my exam I see no evidence of rectal or vaginal bleeding.  Of note she is s/p  Hysterectomy She has had previous GI bleed several years ago.  At this time though she appears very stable.  She reports it  was only a small amount of blood on tissue when she wiped  7:13 AM Patient feeling improved, up and walking around.  She is requesting discharge.  No signs of any active bleed at this time. Is subtherapeutic on her Coumadin.  After discussion with pharmacy, will give one-time dose of Coumadin 10 mg, then she is to continue her medications.  She is to follow-up next week for repeat INR.  Final Clinical Impressions(s) / ED Diagnoses   Final diagnoses:  Rectal bleeding  Subtherapeutic international normalized ratio (INR)    ED Discharge Orders    None       Ripley Fraise, MD 10/23/17 [phone]

## 2017-10-30 DIAGNOSIS — Z86711 Personal history of pulmonary embolism: Secondary | ICD-10-CM | POA: Diagnosis not present

## 2017-10-30 DIAGNOSIS — Z7901 Long term (current) use of anticoagulants: Secondary | ICD-10-CM | POA: Diagnosis not present

## 2017-11-02 DIAGNOSIS — H40033 Anatomical narrow angle, bilateral: Secondary | ICD-10-CM | POA: Diagnosis not present

## 2017-11-02 DIAGNOSIS — H2513 Age-related nuclear cataract, bilateral: Secondary | ICD-10-CM | POA: Diagnosis not present

## 2017-11-26 DIAGNOSIS — Z7901 Long term (current) use of anticoagulants: Secondary | ICD-10-CM | POA: Diagnosis not present

## 2017-11-26 DIAGNOSIS — Z86711 Personal history of pulmonary embolism: Secondary | ICD-10-CM | POA: Diagnosis not present

## 2017-12-14 DIAGNOSIS — Z7901 Long term (current) use of anticoagulants: Secondary | ICD-10-CM | POA: Diagnosis not present

## 2017-12-14 DIAGNOSIS — Z86711 Personal history of pulmonary embolism: Secondary | ICD-10-CM | POA: Diagnosis not present

## 2017-12-29 DIAGNOSIS — Z86711 Personal history of pulmonary embolism: Secondary | ICD-10-CM | POA: Diagnosis not present

## 2017-12-29 DIAGNOSIS — Z7901 Long term (current) use of anticoagulants: Secondary | ICD-10-CM | POA: Diagnosis not present

## 2017-12-31 ENCOUNTER — Telehealth: Payer: Self-pay | Admitting: Hematology

## 2017-12-31 NOTE — Telephone Encounter (Signed)
GK out 11/13 - per [place] move lab/fu three weeks out. Spoke with patient re lab/fu 11/26.

## 2018-01-01 ENCOUNTER — Telehealth: Payer: Self-pay | Admitting: *Deleted

## 2018-01-01 NOTE — Telephone Encounter (Signed)
Patient left VM. Stated she was calling to confirm new appt time. Contacted patient: Gave times of 11/26 10:30AM lab and 11/26 11:00 AM MD. Patient verbalized understanding and thanked caller.

## 2018-01-06 ENCOUNTER — Ambulatory Visit: Payer: Medicare HMO | Admitting: Hematology

## 2018-01-06 ENCOUNTER — Other Ambulatory Visit: Payer: Medicare HMO

## 2018-01-12 DIAGNOSIS — Z86711 Personal history of pulmonary embolism: Secondary | ICD-10-CM | POA: Diagnosis not present

## 2018-01-12 DIAGNOSIS — Z7901 Long term (current) use of anticoagulants: Secondary | ICD-10-CM | POA: Diagnosis not present

## 2018-01-19 ENCOUNTER — Inpatient Hospital Stay (HOSPITAL_BASED_OUTPATIENT_CLINIC_OR_DEPARTMENT_OTHER): Payer: Medicare HMO | Admitting: Hematology

## 2018-01-19 ENCOUNTER — Telehealth: Payer: Self-pay

## 2018-01-19 ENCOUNTER — Inpatient Hospital Stay: Payer: Medicare HMO | Attending: Hematology

## 2018-01-19 VITALS — BP 165/93 | HR 63 | Temp 98.0°F | Resp 18 | Ht 65.0 in | Wt 115.3 lb

## 2018-01-19 DIAGNOSIS — I480 Paroxysmal atrial fibrillation: Secondary | ICD-10-CM | POA: Insufficient documentation

## 2018-01-19 DIAGNOSIS — Z7901 Long term (current) use of anticoagulants: Secondary | ICD-10-CM | POA: Insufficient documentation

## 2018-01-19 DIAGNOSIS — Z86711 Personal history of pulmonary embolism: Secondary | ICD-10-CM | POA: Insufficient documentation

## 2018-01-19 DIAGNOSIS — D649 Anemia, unspecified: Secondary | ICD-10-CM

## 2018-01-19 DIAGNOSIS — Z87891 Personal history of nicotine dependence: Secondary | ICD-10-CM | POA: Insufficient documentation

## 2018-01-19 DIAGNOSIS — D509 Iron deficiency anemia, unspecified: Secondary | ICD-10-CM | POA: Insufficient documentation

## 2018-01-19 DIAGNOSIS — E538 Deficiency of other specified B group vitamins: Secondary | ICD-10-CM | POA: Diagnosis not present

## 2018-01-19 DIAGNOSIS — I10 Essential (primary) hypertension: Secondary | ICD-10-CM | POA: Insufficient documentation

## 2018-01-19 DIAGNOSIS — Z79899 Other long term (current) drug therapy: Secondary | ICD-10-CM | POA: Insufficient documentation

## 2018-01-19 DIAGNOSIS — D696 Thrombocytopenia, unspecified: Secondary | ICD-10-CM | POA: Diagnosis not present

## 2018-01-19 DIAGNOSIS — C8308 Small cell B-cell lymphoma, lymph nodes of multiple sites: Secondary | ICD-10-CM | POA: Diagnosis not present

## 2018-01-19 DIAGNOSIS — E785 Hyperlipidemia, unspecified: Secondary | ICD-10-CM

## 2018-01-19 LAB — CBC WITH DIFFERENTIAL/PLATELET
Abs Immature Granulocytes: 0.02 10*3/uL (ref 0.00–0.07)
BASOS ABS: 0 10*3/uL (ref 0.0–0.1)
BASOS PCT: 0 %
EOS ABS: 0.2 10*3/uL (ref 0.0–0.5)
Eosinophils Relative: 1 %
HCT: 34.8 % — ABNORMAL LOW (ref 36.0–46.0)
Hemoglobin: 10.7 g/dL — ABNORMAL LOW (ref 12.0–15.0)
Immature Granulocytes: 0 %
LYMPHS ABS: 7.7 10*3/uL — AB (ref 0.7–4.0)
Lymphocytes Relative: 63 %
MCH: 27.4 pg (ref 26.0–34.0)
MCHC: 30.7 g/dL (ref 30.0–36.0)
MCV: 89.2 fL (ref 80.0–100.0)
Monocytes Absolute: 2.6 10*3/uL — ABNORMAL HIGH (ref 0.1–1.0)
Monocytes Relative: 20 %
NEUTROS PCT: 16 %
NRBC: 0 % (ref 0.0–0.2)
Neutro Abs: 2 10*3/uL (ref 1.7–7.7)
PLATELETS: 116 10*3/uL — AB (ref 150–400)
RBC: 3.9 MIL/uL (ref 3.87–5.11)
RDW: 15.4 % (ref 11.5–15.5)
WBC: 12.5 10*3/uL — ABNORMAL HIGH (ref 4.0–10.5)

## 2018-01-19 LAB — CMP (CANCER CENTER ONLY)
ALBUMIN: 3.1 g/dL — AB (ref 3.5–5.0)
ALT: 7 U/L (ref 0–44)
ANION GAP: 8 (ref 5–15)
AST: 15 U/L (ref 15–41)
Alkaline Phosphatase: 57 U/L (ref 38–126)
BUN: 12 mg/dL (ref 8–23)
CALCIUM: 9.6 mg/dL (ref 8.9–10.3)
CO2: 24 mmol/L (ref 22–32)
Chloride: 111 mmol/L (ref 98–111)
Creatinine: 0.94 mg/dL (ref 0.44–1.00)
GFR, Estimated: 55 mL/min — ABNORMAL LOW (ref >60)
GLUCOSE: 83 mg/dL (ref 70–99)
Potassium: 3.8 mmol/L (ref 3.5–5.1)
SODIUM: 143 mmol/L (ref 135–145)
Total Bilirubin: 0.5 mg/dL (ref 0.3–1.2)
Total Protein: 7.4 g/dL (ref 6.5–8.1)

## 2018-01-19 LAB — LACTATE DEHYDROGENASE: LDH: 200 U/L — AB (ref 98–192)

## 2018-01-19 NOTE — Progress Notes (Signed)
Marland Kitchen    HEMATOLOGY/ONCOLOGY CLINIC NOTE  Date of Service: 01/19/18   Patient Care Team: Carol Ada, MD as PCP - General (Family Medicine)  CHIEF COMPLAINTS/PURPOSE OF CONSULTATION:   F/u for continue mx of low grade NHL Iron def Anemia  HISTORY OF PRESENTING ILLNESS:   Sara Butler is a wonderful 84 y.o. female who has been referred to [place] by Dr .Carol Ada, MD  for evaluation and management of increasing lymphocytosis, anemia and thrombocytopenia.  Patient has a history of hypertension, dyslipidemia, paroxysmal atrial fibrillation on Coumadin, previous history of pulmonary embolism in 2013, history of iron deficiency due to chronic GI losses on oral iron therapy, GERD.  Patient had recent labs with her primary care physician on 05/01/2016 which showed total WBC count of 10.2k with 6.6k lymphocytes, mild anemia with hemoglobin of 11.4 and somewhat microcytic in dialysis with an MCV of 80 and an RDW of 19. She was also noted to have mild thrombocytopenia with a platelet count of 143k.  Patient had workup including getting TSH which is within normal limits. Noted to have significant B12 deficiency with a B12 level of 116. Has been started on oral B12 5000 g daily by her primary care physician. Recommended considering sublingual B12 replacement. Would need to rule out pernicious anemia.   Patient notes that she has been on oral iron replacement on and off.  She reports issues with what is thought to be irritable bowel syndrome for which she is on Myrbetriq.  Patient notes that she had some abdominal discomfort and had a CT of the abdomen and pelvis on 01/01/2016 which showed Prominent bilateral inguinal as well as gastrohepatic lymphadenopathy. These are new from the prior exam of 2014 in the gastrohepatic changes appear to be new from the recent exam from 06/20/2015.  Patient notes no fevers no chills no night sweats no significant unexpected weight loss. She denies any overt GI  bleeding. No other issues with overt nosebleeds gum bleeds or other sources of bleeding   INTERVAL HISTORY:     Sara Butler is her for f/u on her lymphoproliferative disorder -low grade NHL NOS. The patient's last visit with [place] was on 10/07/17. The pt reports that she is doing well overall.   The pt reports that she has continued to eat the same amount as previously, however she has lost 4 pounds in the last 3 months. She notes that she has not been able to tolerate Boost supplements well, as she developed very loose stools about 3 times a day while consuming Boost.   The pt notes that she always feels cold but denies any fevers or night sweats. She notes that her left lymph node is larger than previously, but does not cause her any pain or discomfort. She denies noticing any other new lumps or bumps. The pt denies any leg swelling as well.   Lab results today (01/19/18) of CBC w/diff and CMP is as follows: all values are WNL except for WBC at 12.5k, HGB at 10.7, HCT at 34.8, PLT at 116k, Lymphs abs at 7.7k, Monocytes abs at 2.6k, Albumin at 3.1.  111/26/19 LDH at 200  On review of systems, pt reports mild weight loss, eating well, stable energy levels, enlarged left inguinal lymph node, and denies fevers, night sweats, leg swelling, painful lymph nodes, noticing any other enlarged lymph nodes, CP, SOB, and any other symptoms.   MEDICAL HISTORY:  Past Medical History:  Diagnosis Date  . Atrial fibrillation ([place])   .  Bleeding from anus   . Cervical cancer ([place])   . Diverticulitis of colon   . GERD (gastroesophageal reflux disease)   . Gout   . Hyperlipidemia   . Hypertension   . Pulmonary embolism ([facility])   Iron deficiency anemia due to chronic blood loss Paroxysmal atrial fibrillation on Coumadin Diverticulosis of the large and this time Vitamin D deficiency Previous history of pulmonary embolism in 2013 Overactive bladder Osteoarthritis of the knee Chronic back pain History of  previous GI bleed in 2011 (while on pradaxa)  SURGICAL HISTORY: Past Surgical History:  Procedure Laterality Date  . ABDOMINAL HYSTERECTOMY    . BACK SURGERY      SOCIAL HISTORY: Social History   Socioeconomic History  . Marital status: Widowed    Spouse name: Not on file  . Number of children: 4  . Years of education: 6  . Highest education level: Not on file  Occupational History  . Occupation: Retired Museum/gallery curator at [employer]  . Financial resource strain: Not on file  . Food insecurity:    Worry: Not on file    Inability: Not on file  . Transportation needs:    Medical: Not on file    Non-medical: Not on file  Tobacco Use  . Smoking status: Former Smoker    Packs/day: 0.10    Years: 58.00    Pack years: 5.80    Types: Cigarettes  . Smokeless tobacco: Never Used  Substance and Sexual Activity  . Alcohol use: No  . Drug use: No  . Sexual activity: Never  Lifestyle  . Physical activity:    Days per week: Not on file    Minutes per session: Not on file  . Stress: Not on file  Relationships  . Social connections:    Talks on phone: Not on file    Gets together: Not on file    Attends religious service: Not on file    Active member of club or organization: Not on file    Attends meetings of clubs or organizations: Not on file    Relationship status: Not on file  . Intimate partner violence:    Fear of current or ex partner: Not on file    Emotionally abused: Not on file    Physically abused: Not on file    Forced sexual activity: Not on file  Other Topics Concern  . Not on file  Social History Narrative   Widowed. Lives alone.  Normally independent of ADLs and ambulation.    FAMILY HISTORY: Family History  Problem Relation Age of Onset  . Heart failure Mother   . Diabetes Brother   . Cancer Neg Hx     ALLERGIES:  has No Known Allergies.  MEDICATIONS:  Current Outpatient Medications  Medication Sig Dispense Refill  . acetaminophen  (TYLENOL) 500 MG tablet Take 500 mg by mouth every 6 (six) hours as needed for headache.    Marland Kitchen atenolol (TENORMIN) 50 MG tablet Take 50 mg daily by mouth.     . Cyanocobalamin (B-12) 1000 MCG SUBL Place 1,000 mcg under the tongue daily. 30 each 3  . diclofenac sodium (VOLTAREN) 1 % GEL Apply 2 g topically daily as needed (pain).     . iron polysaccharides (NIFEREX) 150 MG capsule Take 1 capsule (150 mg total) by mouth daily. 30 capsule 2  . potassium chloride SA (K-DUR,KLOR-CON) 20 MEQ tablet Take 1 tablet (20 mEq total) by mouth 2 (two) times daily for  10 days. (Patient not taking: Reported on 10/23/2017) 20 tablet 0  . warfarin (COUMADIN) 5 MG tablet Take 5-7.5 mg by mouth daily. Take 5 mg on Monday and Tuesday, all other days take 7.5 mg     No current facility-administered medications for this visit.     REVIEW OF SYSTEMS:    A 10+ POINT REVIEW OF SYSTEMS WAS OBTAINED including neurology, dermatology, psychiatry, cardiac, respiratory, lymph, extremities, GI, GU, Musculoskeletal, constitutional, breasts, reproductive, HEENT.  All pertinent positives are noted in the HPI.  All others are negative.   PHYSICAL EXAMINATION: ECOG PERFORMANCE STATUS: 2 - Symptomatic, <50% confined to bed  Vitals:   01/19/18 1125  BP: (!) 165/93  Pulse: 63  Resp: 18  Temp: 98 F (36.7 C)  SpO2: 100%   Filed Weights   01/19/18 1125  Weight: 115 lb 4.8 oz (52.3 kg)   .Body mass index is 19.19 kg/m.  GENERAL:alert, in no acute distress and comfortable SKIN: no acute rashes, no significant lesions EYES: conjunctiva are pink and non-injected, sclera anicteric OROPHARYNX: MMM, no exudates, no oropharyngeal erythema or ulceration NECK: supple, no JVD LYMPH:  Palpable 2cm left inguinal lymph node, minimally larger. No palpable lymphadenopathy in the cervical or axillary regions LUNGS: clear to auscultation b/l with normal respiratory effort HEART: regular rate & rhythm ABDOMEN:  normoactive bowel sounds ,  non tender, not distended. No palpable hepatosplenomegaly.  Extremity: no pedal edema PSYCH: alert & oriented x 3 with fluent speech NEURO: no focal motor/sensory deficits   LABORATORY DATA:  I have reviewed the data as listed  . CBC Latest Ref Rng & Units 01/19/2018 10/23/2017 10/07/2017  WBC 4.0 - 10.5 K/uL 12.5(H) 14.4(H) 12.3(H)  Hemoglobin 12.0 - 15.0 g/dL 10.7(L) 11.2(L) 11.8  Hematocrit 36.0 - 46.0 % 34.8(L) 36.0 34.7(L)  Platelets 150 - 400 K/uL 116(L) 124(L) 120(L)    CBC    Component Value Date/Time   WBC 12.5 (H) 01/19/2018 1006   RBC 3.90 01/19/2018 1006   HGB 10.7 (L) 01/19/2018 1006   HGB 9.5 (L) 05/01/2017 0921   HGB 10.4 (L) 12/02/2016 0900   HCT 34.8 (L) 01/19/2018 1006   HCT 32.4 (L) 12/02/2016 0900   PLT 116 (L) 01/19/2018 1006   PLT 130 (L) 05/01/2017 0921   PLT 131 (L) 12/02/2016 0900   MCV 89.2 01/19/2018 1006   MCV 78.8 (L) 12/02/2016 0900   MCH 27.4 01/19/2018 1006   MCHC 30.7 01/19/2018 1006   RDW 15.4 01/19/2018 1006   RDW 19.1 (H) 12/02/2016 0900   LYMPHSABS 7.7 (H) 01/19/2018 1006   LYMPHSABS 10.6 (H) 12/02/2016 0900   MONOABS 2.6 (H) 01/19/2018 1006   MONOABS 1.0 (H) 12/02/2016 0900   EOSABS 0.2 01/19/2018 1006   EOSABS 0.2 12/02/2016 0900   BASOSABS 0.0 01/19/2018 1006   BASOSABS 0.0 12/02/2016 0900    CMP Latest Ref Rng & Units 01/19/2018 10/23/2017 10/07/2017  Glucose 70 - 99 mg/dL 83 97 83  BUN 8 - 23 mg/dL 12 16 10   Creatinine 0.44 - 1.00 mg/dL 0.94 0.90 0.83  Sodium 135 - 145 mmol/L 143 142 143  Potassium 3.5 - 5.1 mmol/L 3.8 4.3 4.0  Chloride 98 - 111 mmol/L 111 109 110  CO2 22 - 32 mmol/L 24 25 23   Calcium 8.9 - 10.3 mg/dL 9.6 10.3 10.0  Total Protein 6.5 - 8.1 g/dL 7.4 7.9 7.5  Total Bilirubin 0.3 - 1.2 mg/dL 0.5 0.6 0.8  Alkaline Phos 38 - 126 U/L 57  64 71  AST 15 - 41 U/L 15 24 19   ALT 0 - 44 U/L 7 19 14    . Lab Results  Component Value Date   IRON 46 10/07/2017   TIBC 226 (L) 10/07/2017   IRONPCTSAT 20 (L)  10/07/2017   (Iron and TIBC)  Lab Results  Component Value Date   FERRITIN 393 (H) 10/07/2017        RADIOGRAPHIC STUDIES: I have personally reviewed the radiological images as listed and agreed with the findings in the report.  PET/CT scan 08/18/2016 IMPRESSION: 1. Scattered primarily mild adenopathy most notable in the bilateral inguinal regions, but also with involvement of the left internal mammary chain, left axilla, retroperitoneum, gastrohepatic ligament, and of a pericardial lymph node. Questionable involvement of a small right station 2 lymph node and at the right axilla. Much of this is Deauville 4 disease, with some Deauville 3 and of L2 disease as noted above. 2. Faintly accentuated activity in the spleen without focal activity or overt splenomegaly. This may merit surveillance.  3. There is some Deauville 3 level activity associated with mild soft tissue prominence of the labia majora. This could be simply incidental. 4. Other imaging findings of potential clinical significance: Aortic Atherosclerosis (ICD10-I70.0). Coronary atherosclerosis. Chronic left occipital lobe encephalomalacia. Hepatic cysts and potential scarring in the right hepatic lobe. Pancreas divisum. Chronic degenerative anterolisthesis at L4-5. Bony demineralization.   Electronically Signed   By: Van Clines M.D.   On: 08/18/2016 13:09   ASSESSMENT & PLAN:   84 y.o. female with multiple medical comorbidities with  #1 Low grade Non Hodgkins B cell lymphoma NOS  Flow cytometry suggestive of Cd20+ CD5-CD 10 neg Low grade Non Hodgkins lymphoma. Concern for possible CD5 neg chronic lymphocytic leukemia versus low grade non-Hodgkin's lymphoma NOS  Patient did have some borderline abdominal and inguinal lymphadenopathy on her CT abdomen pelvis in November 2017.  PET/CT 6/25 showed Scattered primarily mild adenopathy most notable in the bilateral inguinal regions, but also with involvement of  the left internal mammary chain, left axilla, retroperitoneum, gastrohepatic ligament, and of a pericardial lymph node. Questionable involvement of a small right station 2 lymph node and at the right axilla. Much of this is Deauville 4 disease, with some Deauville 3 and of L2 disease as noted above. 2. Faintly accentuated activity in the spleen without focal activity or overt splenomegaly.  CLL FISH Prognostic panel -- did not show any mutation typical for CLL. Less likely CD5 neg CLL US guided Bx of inguinal LN was done- however only limited lymphoid tissue was obtained and the biopsy is nondiagnostic. Patient had previously decided to hold off  On an excisional lymph node biopsy  #2 Anemia  - microcytic anemia with some element to iron deficiency.  Hg increased to 11.8 Ferritin level at 49 as of 12/02/16 and 41 on 05/01/17 Iron /ferritin levels much improved after IV iron . Lab Results  Component Value Date   IRON 46 10/07/2017   TIBC 226 (L) 10/07/2017   IRONPCTSAT 20 (L) 10/07/2017   (Iron and TIBC)  Lab Results  Component Value Date   FERRITIN 393 (H) 10/07/2017    #3 B12 deficiency. B12 levels are coming up from 116 now up to 295 to 722 with oral B12 replacement. No evidence of pernicious anemia based on neg antibody testing. B12 - 405 in 04/2017  PLAN: -Discussed pt labwork today, 01/19/18; blood chemistries are stable, LDH stable at 200, Hgb slightly lower at 10.7,  PLT slightly lower at 116k, Lymphs abs improved to 7.7k.  -Continue Vitamin B complex, and 1067mcg Vitamin B12 SL -No indication for initiating treatment at this time -10/07/17 ferritin >100. No indication for additional IV Iron at this time -will plan to rpt scan in 6 months unless new symptoms prior to had. -Recommended that the pt address her medication management questions with her PCP Dr. Carol Ada -Will see the pt back in 3 months     #4 Mild thrombocytopenia-  could be related to her NHL -Will continue  to monitor.   #5 Weight Loss . Wt Readings from Last 3 Encounters:  01/19/18 115 lb 4.8 oz (52.3 kg)  10/07/17 119 lb 1.6 oz (54 kg)  07/11/17 114 lb (51.7 kg)   -Referral to dietician to help with decreased appetite was previously sent and will continue to monitor for symptoms -She will follow up with our dietician per pt request.  -Recommend trying Carnation instant breakfast or other meal supplements if Boost is not tolerated  -I recommend she continue to work on gaining weight by eating smaller, more frequent meals.   RTC with Dr Irene Limbo in 3 months with labs   All of the patients were answered with apparent satisfaction. The patient knows to call the clinic with any problems, questions or concerns.  The total time spent in the appt was 25 minutes and more than 50% was on counseling and direct patient cares.   Sullivan Lone MD [place] AAHIVMS [facility] Hematology/Oncology Physician [facility]  (Office):       [phone] (Work cell):  [phone] (Fax):           [phone]  I, Baldwin [place], am acting as a scribe for Dr. Sullivan Lone.   .I have reviewed the above documentation for accuracy and completeness, and I agree with the above. Brunetta Genera MD

## 2018-01-19 NOTE — Telephone Encounter (Signed)
Printed avs and calender of upcoming appointment. Per 11/26 los 

## 2018-01-28 ENCOUNTER — Other Ambulatory Visit: Payer: Self-pay | Admitting: Hematology

## 2018-01-29 DIAGNOSIS — Z7901 Long term (current) use of anticoagulants: Secondary | ICD-10-CM | POA: Diagnosis not present

## 2018-01-29 DIAGNOSIS — Z86711 Personal history of pulmonary embolism: Secondary | ICD-10-CM | POA: Diagnosis not present

## 2018-02-04 DIAGNOSIS — Z7901 Long term (current) use of anticoagulants: Secondary | ICD-10-CM | POA: Diagnosis not present

## 2018-02-04 DIAGNOSIS — Z86711 Personal history of pulmonary embolism: Secondary | ICD-10-CM | POA: Diagnosis not present

## 2018-02-10 ENCOUNTER — Other Ambulatory Visit: Payer: Self-pay | Admitting: *Deleted

## 2018-02-10 MED ORDER — B-12 (METHYLCOBALAMIN) 1000 MCG SL SUBL: 1.0000 | SUBLINGUAL_TABLET | Freq: Every day | SUBLINGUAL | 1 refills | Status: DC

## 2018-02-11 ENCOUNTER — Other Ambulatory Visit: Payer: Self-pay | Admitting: *Deleted

## 2018-02-11 MED ORDER — B-12 (METHYLCOBALAMIN) 1000 MCG SL SUBL: 1.0000 | SUBLINGUAL_TABLET | Freq: Every day | SUBLINGUAL | 1 refills | Status: DC

## 2018-02-25 DIAGNOSIS — Z7901 Long term (current) use of anticoagulants: Secondary | ICD-10-CM | POA: Diagnosis not present

## 2018-02-25 DIAGNOSIS — Z86711 Personal history of pulmonary embolism: Secondary | ICD-10-CM | POA: Diagnosis not present

## 2018-03-11 DIAGNOSIS — Z7901 Long term (current) use of anticoagulants: Secondary | ICD-10-CM | POA: Diagnosis not present

## 2018-03-11 DIAGNOSIS — Z86711 Personal history of pulmonary embolism: Secondary | ICD-10-CM | POA: Diagnosis not present

## 2018-03-11 DIAGNOSIS — C8308 Small cell B-cell lymphoma, lymph nodes of multiple sites: Secondary | ICD-10-CM | POA: Diagnosis not present

## 2018-03-11 DIAGNOSIS — I10 Essential (primary) hypertension: Secondary | ICD-10-CM | POA: Diagnosis not present

## 2018-03-11 DIAGNOSIS — N3281 Overactive bladder: Secondary | ICD-10-CM | POA: Diagnosis not present

## 2018-03-11 DIAGNOSIS — E785 Hyperlipidemia, unspecified: Secondary | ICD-10-CM | POA: Diagnosis not present

## 2018-03-29 DIAGNOSIS — Z7901 Long term (current) use of anticoagulants: Secondary | ICD-10-CM | POA: Diagnosis not present

## 2018-03-29 DIAGNOSIS — Z86711 Personal history of pulmonary embolism: Secondary | ICD-10-CM | POA: Diagnosis not present

## 2018-03-29 DIAGNOSIS — R6889 Other general symptoms and signs: Secondary | ICD-10-CM | POA: Diagnosis not present

## 2018-04-12 DIAGNOSIS — Z7901 Long term (current) use of anticoagulants: Secondary | ICD-10-CM | POA: Diagnosis not present

## 2018-04-12 DIAGNOSIS — Z86711 Personal history of pulmonary embolism: Secondary | ICD-10-CM | POA: Diagnosis not present

## 2018-04-20 ENCOUNTER — Ambulatory Visit: Payer: Medicare HMO | Admitting: Hematology

## 2018-04-20 ENCOUNTER — Other Ambulatory Visit: Payer: Medicare HMO

## 2018-04-21 ENCOUNTER — Telehealth: Payer: Self-pay | Admitting: Hematology

## 2018-04-21 NOTE — Telephone Encounter (Signed)
Spoke with patient and got her set up with transportation to her appointment on 3/11.

## 2018-05-05 ENCOUNTER — Other Ambulatory Visit: Payer: Self-pay

## 2018-05-05 ENCOUNTER — Inpatient Hospital Stay: Payer: Medicare HMO | Attending: Hematology

## 2018-05-05 ENCOUNTER — Inpatient Hospital Stay (HOSPITAL_BASED_OUTPATIENT_CLINIC_OR_DEPARTMENT_OTHER): Payer: Medicare HMO | Admitting: Hematology

## 2018-05-05 ENCOUNTER — Telehealth: Payer: Self-pay | Admitting: Hematology

## 2018-05-05 VITALS — BP 168/91 | HR 74 | Temp 98.4°F | Resp 18 | Ht 65.0 in | Wt 111.9 lb

## 2018-05-05 DIAGNOSIS — R413 Other amnesia: Secondary | ICD-10-CM

## 2018-05-05 DIAGNOSIS — R197 Diarrhea, unspecified: Secondary | ICD-10-CM | POA: Insufficient documentation

## 2018-05-05 DIAGNOSIS — D509 Iron deficiency anemia, unspecified: Secondary | ICD-10-CM | POA: Diagnosis not present

## 2018-05-05 DIAGNOSIS — D649 Anemia, unspecified: Secondary | ICD-10-CM

## 2018-05-05 DIAGNOSIS — Z79899 Other long term (current) drug therapy: Secondary | ICD-10-CM

## 2018-05-05 DIAGNOSIS — Z87891 Personal history of nicotine dependence: Secondary | ICD-10-CM

## 2018-05-05 DIAGNOSIS — R634 Abnormal weight loss: Secondary | ICD-10-CM | POA: Insufficient documentation

## 2018-05-05 DIAGNOSIS — D696 Thrombocytopenia, unspecified: Secondary | ICD-10-CM | POA: Insufficient documentation

## 2018-05-05 DIAGNOSIS — C8308 Small cell B-cell lymphoma, lymph nodes of multiple sites: Secondary | ICD-10-CM | POA: Diagnosis not present

## 2018-05-05 DIAGNOSIS — E538 Deficiency of other specified B group vitamins: Secondary | ICD-10-CM | POA: Insufficient documentation

## 2018-05-05 DIAGNOSIS — R630 Anorexia: Secondary | ICD-10-CM | POA: Diagnosis not present

## 2018-05-05 DIAGNOSIS — I700 Atherosclerosis of aorta: Secondary | ICD-10-CM | POA: Diagnosis not present

## 2018-05-05 LAB — CBC WITH DIFFERENTIAL/PLATELET
ABS IMMATURE GRANULOCYTES: 0 10*3/uL (ref 0.00–0.07)
Basophils Absolute: 0 10*3/uL (ref 0.0–0.1)
Basophils Relative: 0 %
EOS PCT: 0 %
Eosinophils Absolute: 0 10*3/uL (ref 0.0–0.5)
HCT: 36.5 % (ref 36.0–46.0)
HEMOGLOBIN: 11.2 g/dL — AB (ref 12.0–15.0)
LYMPHS ABS: 12.2 10*3/uL — AB (ref 0.7–4.0)
LYMPHS PCT: 80 %
MCH: 27.2 pg (ref 26.0–34.0)
MCHC: 30.7 g/dL (ref 30.0–36.0)
MCV: 88.6 fL (ref 80.0–100.0)
MONO ABS: 0.2 10*3/uL (ref 0.1–1.0)
Monocytes Relative: 1 %
NEUTROS ABS: 2.9 10*3/uL (ref 1.7–17.7)
Neutrophils Relative %: 19 %
Platelets: 117 10*3/uL — ABNORMAL LOW (ref 150–400)
RBC: 4.12 MIL/uL (ref 3.87–5.11)
RDW: 16.4 % — ABNORMAL HIGH (ref 11.5–15.5)
WBC: 15.3 10*3/uL — ABNORMAL HIGH (ref 4.0–10.5)
nRBC: 0 % (ref 0.0–0.2)

## 2018-05-05 LAB — CMP (CANCER CENTER ONLY)
ALBUMIN: 3.4 g/dL — AB (ref 3.5–5.0)
ALT: 8 U/L (ref 0–44)
AST: 18 U/L (ref 15–41)
Alkaline Phosphatase: 64 U/L (ref 38–126)
Anion gap: 9 (ref 5–15)
BUN: 14 mg/dL (ref 8–23)
CHLORIDE: 109 mmol/L (ref 98–111)
CO2: 23 mmol/L (ref 22–32)
CREATININE: 0.92 mg/dL (ref 0.44–1.00)
Calcium: 10 mg/dL (ref 8.9–10.3)
GFR, Est AFR Am: >60 mL/min (ref >60)
GFR, Estimated: 56 mL/min — ABNORMAL LOW (ref >60)
GLUCOSE: 86 mg/dL (ref 70–99)
POTASSIUM: 3.9 mmol/L (ref 3.5–5.1)
SODIUM: 141 mmol/L (ref 135–145)
Total Bilirubin: 0.8 mg/dL (ref 0.3–1.2)
Total Protein: 8.3 g/dL — ABNORMAL HIGH (ref 6.5–8.1)

## 2018-05-05 LAB — LACTATE DEHYDROGENASE: LDH: 236 U/L — AB (ref 98–192)

## 2018-05-05 MED ORDER — DEXAMETHASONE 2 MG PO TABS: 2.0000 mg | ORAL_TABLET | Freq: Every day | ORAL | 0 refills | Status: DC

## 2018-05-05 NOTE — Telephone Encounter (Signed)
Gave avs and calendar ° °

## 2018-05-05 NOTE — Progress Notes (Signed)
Marland Kitchen    HEMATOLOGY/ONCOLOGY CLINIC NOTE  Date of Service: 05/05/18   Patient Care Team: Carol Ada, MD as PCP - General (Family Medicine)  CHIEF COMPLAINTS/PURPOSE OF CONSULTATION:   F/u for continue mx of low grade NHL Iron def Anemia  HISTORY OF PRESENTING ILLNESS:   Sara Butler is a wonderful 85 y.o. female who has been referred to [place] by Dr .Carol Ada, MD  for evaluation and management of increasing lymphocytosis, anemia and thrombocytopenia.  Patient has a history of hypertension, dyslipidemia, paroxysmal atrial fibrillation on Coumadin, previous history of pulmonary embolism in 2013, history of iron deficiency due to chronic GI losses on oral iron therapy, GERD.  Patient had recent labs with her primary care physician on 05/01/2016 which showed total WBC count of 10.2k with 6.6k lymphocytes, mild anemia with hemoglobin of 11.4 and somewhat microcytic in dialysis with an MCV of 80 and an RDW of 19. She was also noted to have mild thrombocytopenia with a platelet count of 143k.  Patient had workup including getting TSH which is within normal limits. Noted to have significant B12 deficiency with a B12 level of 116. Has been started on oral B12 5000 g daily by her primary care physician. Recommended considering sublingual B12 replacement. Would need to rule out pernicious anemia.   Patient notes that she has been on oral iron replacement on and off.  She reports issues with what is thought to be irritable bowel syndrome for which she is on Myrbetriq.  Patient notes that she had some abdominal discomfort and had a CT of the abdomen and pelvis on 01/01/2016 which showed Prominent bilateral inguinal as well as gastrohepatic lymphadenopathy. These are new from the prior exam of 2014 in the gastrohepatic changes appear to be new from the recent exam from 06/20/2015.  Patient notes no fevers no chills no night sweats no significant unexpected weight loss. She denies any overt GI  bleeding. No other issues with overt nosebleeds gum bleeds or other sources of bleeding  INTERVAL HISTORY:     EH SAUSEDA is her for f/u on her lymphoproliferative disorder -low grade NHL NOS. The patient's last visit with [place] was on 01/19/18. The pt reports that she is doing well overall.   The pt reports that she has lost 4 pounds in the interim. She notes that she is eating well. The pt notes that she "doesn't cook like she used to," and could probably eat more, which she notes that she will do in the next 6 weeks before our next visit.  She notes that she gets diarrhea with fair frequency, which is her baseline. She endorses concern of memory loss, noting she "can't remember things like she used to." She doesn't forget who she is nor who her family is. She notes that she isn't able to work in her yard anymore. The pt lives with her daughter. She does not use a walker nor a cane, and notes that she does not wish to use these. She did fall today after tripping over a bag, and denies sustaining any injury from this.  The pt denies fevers. She notes that she has had some sweating at night, which are not drenching.  The pt is going to the urologist soon to evaluate her frequent urination and bladder incontinence  Lab results today (05/05/18) of CBC w/diff and CMP is as follows: all values are WNL except for WBC at 15.3k, HGB at 11.2, RDW at 16.4, PLT at 117k, Lymphs abs  at 12.2k, Total Protein at 8.3, Albumin at 3.4. 05/05/18 LDH at 236k  On review of systems, pt reports eating well, good appetite, headaches, frequent urination, some weight loss, baseline diarrhea, some incontinence, and denies noticing any new lumps or bumps, abdominal pains, leg swelling, and any other symptoms.  MEDICAL HISTORY:  Past Medical History:  Diagnosis Date  . Atrial fibrillation ([place])   . Bleeding from anus   . Cervical cancer ([place])   . Diverticulitis of colon   . GERD (gastroesophageal reflux disease)   .  Gout   . Hyperlipidemia   . Hypertension   . Pulmonary embolism ([facility])   Iron deficiency anemia due to chronic blood loss Paroxysmal atrial fibrillation on Coumadin Diverticulosis of the large and this time Vitamin D deficiency Previous history of pulmonary embolism in 2013 Overactive bladder Osteoarthritis of the knee Chronic back pain History of previous GI bleed in 2011 (while on pradaxa)  SURGICAL HISTORY: Past Surgical History:  Procedure Laterality Date  . ABDOMINAL HYSTERECTOMY    . BACK SURGERY      SOCIAL HISTORY: Social History   Socioeconomic History  . Marital status: Widowed    Spouse name: Not on file  . Number of children: 4  . Years of education: 44  . Highest education level: Not on file  Occupational History  . Occupation: Retired Museum/gallery curator at [employer]  . Financial resource strain: Not on file  . Food insecurity:    Worry: Not on file    Inability: Not on file  . Transportation needs:    Medical: Not on file    Non-medical: Not on file  Tobacco Use  . Smoking status: Former Smoker    Packs/day: 0.10    Years: 58.00    Pack years: 5.80    Types: Cigarettes  . Smokeless tobacco: Never Used  Substance and Sexual Activity  . Alcohol use: No  . Drug use: No  . Sexual activity: Never  Lifestyle  . Physical activity:    Days per week: Not on file    Minutes per session: Not on file  . Stress: Not on file  Relationships  . Social connections:    Talks on phone: Not on file    Gets together: Not on file    Attends religious service: Not on file    Active member of club or organization: Not on file    Attends meetings of clubs or organizations: Not on file    Relationship status: Not on file  . Intimate partner violence:    Fear of current or ex partner: Not on file    Emotionally abused: Not on file    Physically abused: Not on file    Forced sexual activity: Not on file  Other Topics Concern  . Not on file  Social History  Narrative   Widowed. Lives alone.  Normally independent of ADLs and ambulation.    FAMILY HISTORY: Family History  Problem Relation Age of Onset  . Heart failure Mother   . Diabetes Brother   . Cancer Neg Hx     ALLERGIES:  has No Known Allergies.  MEDICATIONS:  Current Outpatient Medications  Medication Sig Dispense Refill  . acetaminophen (TYLENOL) 500 MG tablet Take 500 mg by mouth every 6 (six) hours as needed for headache.    Marland Kitchen atenolol (TENORMIN) 50 MG tablet Take 50 mg daily by mouth.     . B-12, Methylcobalamin, 1000 MCG SUBL Place 1 tablet  under the tongue daily. 60 tablet 1  . Cyanocobalamin (B-12) 1000 MCG SUBL Place 1,000 mcg under the tongue daily. 30 each 3  . dexamethasone (DECADRON) 2 MG tablet Take 1 tablet (2 mg total) by mouth daily with breakfast. 30 tablet 0  . diclofenac sodium (VOLTAREN) 1 % GEL Apply 2 g topically daily as needed (pain).     . iron polysaccharides (NIFEREX) 150 MG capsule Take 1 capsule (150 mg total) by mouth daily. 30 capsule 2  . potassium chloride SA (K-DUR,KLOR-CON) 20 MEQ tablet Take 1 tablet (20 mEq total) by mouth 2 (two) times daily for 10 days. (Patient not taking: Reported on 10/23/2017) 20 tablet 0  . warfarin (COUMADIN) 5 MG tablet Take 5-7.5 mg by mouth daily. Take 5 mg on Monday and Tuesday, all other days take 7.5 mg     No current facility-administered medications for this visit.     REVIEW OF SYSTEMS:    A 10+ POINT REVIEW OF SYSTEMS WAS OBTAINED including neurology, dermatology, psychiatry, cardiac, respiratory, lymph, extremities, GI, GU, Musculoskeletal, constitutional, breasts, reproductive, HEENT.  All pertinent positives are noted in the HPI.  All others are negative.   PHYSICAL EXAMINATION: ECOG PERFORMANCE STATUS: 2 - Symptomatic, <50% confined to bed  Vitals:   05/05/18 1536  BP: (!) 168/91  Pulse: 74  Resp: 18  Temp: 98.4 F (36.9 C)  SpO2: 99%   Filed Weights   05/05/18 1536  Weight: 111 lb 14.4 oz  (50.8 kg)   .Body mass index is 18.62 kg/m.   LYMPH:  Palpable 2cm left inguinal lymph node, minimally larger. No palpable lymphadenopathy in the cervical or axillary regions GENERAL:alert, in no acute distress and comfortable SKIN: no acute rashes, no significant lesions EYES: conjunctiva are pink and non-injected, sclera anicteric OROPHARYNX: MMM, no exudates, no oropharyngeal erythema or ulceration NECK: supple, no JVD LYMPH: Palpable 2cm left inguinal lymph node, minimally larger. No palpable lymphadenopathy in the cervical or axillary regions LUNGS: clear to auscultation b/l with normal respiratory effort HEART: regular rate & rhythm ABDOMEN:  normoactive bowel sounds , non tender, not distended. No palpable hepatosplenomegaly.  Extremity: no pedal edema PSYCH: alert & oriented x 3 with fluent speech NEURO: no focal motor/sensory deficits   LABORATORY DATA:  I have reviewed the data as listed  . CBC Latest Ref Rng & Units 05/05/2018 01/19/2018 10/23/2017  WBC 4.0 - 10.5 K/uL 15.3(H) 12.5(H) 14.4(H)  Hemoglobin 12.0 - 15.0 g/dL 11.2(L) 10.7(L) 11.2(L)  Hematocrit 36.0 - 46.0 % 36.5 34.8(L) 36.0  Platelets 150 - 400 K/uL 117(L) 116(L) 124(L)    CBC    Component Value Date/Time   WBC 15.3 (H) 05/05/2018 1438   RBC 4.12 05/05/2018 1438   HGB 11.2 (L) 05/05/2018 1438   HGB 9.5 (L) 05/01/2017 0921   HGB 10.4 (L) 12/02/2016 0900   HCT 36.5 05/05/2018 1438   HCT 32.4 (L) 12/02/2016 0900   PLT 117 (L) 05/05/2018 1438   PLT 130 (L) 05/01/2017 0921   PLT 131 (L) 12/02/2016 0900   MCV 88.6 05/05/2018 1438   MCV 78.8 (L) 12/02/2016 0900   MCH 27.2 05/05/2018 1438   MCHC 30.7 05/05/2018 1438   RDW 16.4 (H) 05/05/2018 1438   RDW 19.1 (H) 12/02/2016 0900   LYMPHSABS 12.2 (H) 05/05/2018 1438   LYMPHSABS 10.6 (H) 12/02/2016 0900   MONOABS 0.2 05/05/2018 1438   MONOABS 1.0 (H) 12/02/2016 0900   EOSABS 0.0 05/05/2018 1438   EOSABS 0.2 12/02/2016 0900  BASOSABS 0.0 05/05/2018  1438   BASOSABS 0.0 12/02/2016 0900    CMP Latest Ref Rng & Units 05/05/2018 01/19/2018 10/23/2017  Glucose 70 - 99 mg/dL 86 83 97  BUN 8 - 23 mg/dL 14 12 16   Creatinine 0.44 - 1.00 mg/dL 0.92 0.94 0.90  Sodium 135 - 145 mmol/L 141 143 142  Potassium 3.5 - 5.1 mmol/L 3.9 3.8 4.3  Chloride 98 - 111 mmol/L 109 111 109  CO2 22 - 32 mmol/L 23 24 25   Calcium 8.9 - 10.3 mg/dL 10.0 9.6 10.3  Total Protein 6.5 - 8.1 g/dL 8.3(H) 7.4 7.9  Total Bilirubin 0.3 - 1.2 mg/dL 0.8 0.5 0.6  Alkaline Phos 38 - 126 U/L 64 57 64  AST 15 - 41 U/L 18 15 24   ALT 0 - 44 U/L 8 7 19    . Lab Results  Component Value Date   IRON 46 10/07/2017   TIBC 226 (L) 10/07/2017   IRONPCTSAT 20 (L) 10/07/2017   (Iron and TIBC)  Lab Results  Component Value Date   FERRITIN 393 (H) 10/07/2017        RADIOGRAPHIC STUDIES: I have personally reviewed the radiological images as listed and agreed with the findings in the report.  PET/CT scan 08/18/2016 IMPRESSION: 1. Scattered primarily mild adenopathy most notable in the bilateral inguinal regions, but also with involvement of the left internal mammary chain, left axilla, retroperitoneum, gastrohepatic ligament, and of a pericardial lymph node. Questionable involvement of a small right station 2 lymph node and at the right axilla. Much of this is Deauville 4 disease, with some Deauville 3 and of L2 disease as noted above. 2. Faintly accentuated activity in the spleen without focal activity or overt splenomegaly. This may merit surveillance.  3. There is some Deauville 3 level activity associated with mild soft tissue prominence of the labia majora. This could be simply incidental. 4. Other imaging findings of potential clinical significance: Aortic Atherosclerosis (ICD10-I70.0). Coronary atherosclerosis. Chronic left occipital lobe encephalomalacia. Hepatic cysts and potential scarring in the right hepatic lobe. Pancreas divisum. Chronic degenerative anterolisthesis  at L4-5. Bony demineralization.   Electronically Signed   By: Van Clines M.D.   On: 08/18/2016 13:09   ASSESSMENT & PLAN:   85 y.o. female with multiple medical comorbidities with  #1 Low grade Non Hodgkins B cell lymphoma NOS  Flow cytometry suggestive of Cd20+ CD5-CD 10 neg Low grade Non Hodgkins lymphoma. Concern for possible CD5 neg chronic lymphocytic leukemia versus low grade non-Hodgkin's lymphoma NOS  Patient did have some borderline abdominal and inguinal lymphadenopathy on her CT abdomen pelvis in November 2017.  PET/CT 6/25 showed Scattered primarily mild adenopathy most notable in the bilateral inguinal regions, but also with involvement of the left internal mammary chain, left axilla, retroperitoneum, gastrohepatic ligament, and of a pericardial lymph node. Questionable involvement of a small right station 2 lymph node and at the right axilla. Much of this is Deauville 4 disease, with some Deauville 3 and of L2 disease as noted above. 2. Faintly accentuated activity in the spleen without focal activity or overt splenomegaly.  CLL FISH Prognostic panel -- did not show any mutation typical for CLL. Less likely CD5 neg CLL US guided Bx of inguinal LN was done- however only limited lymphoid tissue was obtained and the biopsy is nondiagnostic. Patient had previously decided to hold off  On an excisional lymph node biopsy  #2 Anemia  - microcytic anemia with some element to iron deficiency.  Iron /  ferritin levels much improved after IV iron . Lab Results  Component Value Date   IRON 46 10/07/2017   TIBC 226 (L) 10/07/2017   IRONPCTSAT 20 (L) 10/07/2017   (Iron and TIBC)  Lab Results  Component Value Date   FERRITIN 393 (H) 10/07/2017    #3 B12 deficiency. B12 levels are coming up from 116 now up to 295 to 722 with oral B12 replacement. No evidence of pernicious anemia based on neg antibody testing. B12 - 405 in 04/2017  PLAN: -Discussed pt labwork today,  05/05/18; HGB improved to 11.2, PLT stable at 117k, Lymphs abs at 12.2k -Pt has lost 8 pounds in the last 6 months, and the pt noted that she could probably eat better. Discussed that unexpected weight loss would be a reason to consider repeating a scan. Pt will try to eat better, we will evaluate her weight at next visit, and consider necessity of repeating a scan at that time. -Will begin low dose of Dexamethasone, she will take this with breakfast -Recommend tylenol as needed for headaches, not NSAIDs -Recommend otc loperamide -Recommend using a cane to prevent falls -Continue Vitamin B complex, and 1081mcg Vitamin B12 SL -10/07/17 ferritin >100. No indication for additional IV Iron at this time -Recommended that the pt address her medication management questions with her PCP Dr. Carol Ada -Will see the pt back in 6 weeks   #4 Mild thrombocytopenia-  could be related to her NHL -Will continue to monitor.   #5 Weight Loss . Wt Readings from Last 3 Encounters:  05/05/18 111 lb 14.4 oz (50.8 kg)  01/19/18 115 lb 4.8 oz (52.3 kg)  10/07/17 119 lb 1.6 oz (54 kg)   -Referral to dietician to help with decreased appetite was previously sent and will continue to monitor for symptoms -She will follow up with our dietician per pt request.  -Recommend trying Carnation instant breakfast or other meal supplements if Boost is not tolerated  -I recommend she continue to work on gaining weight by eating smaller, more frequent meals.   RTC with Dr Irene Limbo with labs in 6 weeks    All of the patients were answered with apparent satisfaction. The patient knows to call the clinic with any problems, questions or concerns.  The total time spent in the appt was 25 minutes and more than 50% was on counseling and direct patient cares.    Sullivan Lone MD [place] AAHIVMS [facility] Hematology/Oncology Physician [facility]  (Office):       [phone] (Work cell):  [phone] (Fax):            [phone]  I, Baldwin [place], am acting as a scribe for Dr. Sullivan Lone.   .I have reviewed the above documentation for accuracy and completeness, and I agree with the above. Brunetta Genera MD

## 2018-05-27 DIAGNOSIS — Z86711 Personal history of pulmonary embolism: Secondary | ICD-10-CM | POA: Diagnosis not present

## 2018-05-27 DIAGNOSIS — Z7901 Long term (current) use of anticoagulants: Secondary | ICD-10-CM | POA: Diagnosis not present

## 2018-06-07 DIAGNOSIS — N3946 Mixed incontinence: Secondary | ICD-10-CM | POA: Diagnosis not present

## 2018-06-07 DIAGNOSIS — R3915 Urgency of urination: Secondary | ICD-10-CM | POA: Diagnosis not present

## 2018-06-12 ENCOUNTER — Other Ambulatory Visit: Payer: Self-pay | Admitting: Hematology

## 2018-06-15 NOTE — Progress Notes (Signed)
HEMATOLOGY/ONCOLOGY CLINIC NOTE  Date of Service: 06/16/18   Patient Care Team: Carol Ada, MD as PCP - General (Family Medicine)  CHIEF COMPLAINTS/PURPOSE OF CONSULTATION:   F/u for continue mx of low grade NHL Iron def Anemia  HISTORY OF PRESENTING ILLNESS:   Sara Butler is a wonderful 85 y.o. female who has been referred to [place] by Dr .Carol Ada, MD  for evaluation and management of increasing lymphocytosis, anemia and thrombocytopenia.  Patient has a history of hypertension, dyslipidemia, paroxysmal atrial fibrillation on Coumadin, previous history of pulmonary embolism in 2013, history of iron deficiency due to chronic GI losses on oral iron therapy, GERD.  Patient had recent labs with her primary care physician on 05/01/2016 which showed total WBC count of 10.2k with 6.6k lymphocytes, mild anemia with hemoglobin of 11.4 and somewhat microcytic in dialysis with an MCV of 80 and an RDW of 19. She was also noted to have mild thrombocytopenia with a platelet count of 143k.  Patient had workup including getting TSH which is within normal limits. Noted to have significant B12 deficiency with a B12 level of 116. Has been started on oral B12 5000 g daily by her primary care physician. Recommended considering sublingual B12 replacement. Would need to rule out pernicious anemia.   Patient notes that she has been on oral iron replacement on and off.  She reports issues with what is thought to be irritable bowel syndrome for which she is on Myrbetriq.  Patient notes that she had some abdominal discomfort and had a CT of the abdomen and pelvis on 01/01/2016 which showed Prominent bilateral inguinal as well as gastrohepatic lymphadenopathy. These are new from the prior exam of 2014 in the gastrohepatic changes appear to be new from the recent exam from 06/20/2015.  Patient notes no fevers no chills no night sweats no significant unexpected weight loss. She denies any overt GI  bleeding. No other issues with overt nosebleeds gum bleeds or other sources of bleeding  INTERVAL HISTORY:     Sara Butler is her for f/u on her lymphoproliferative disorder -low grade NHL NOS. The patient's last visit with [place] was on 05/05/18. The pt reports that she is doing well overall.  At our last visit on 05/05/18 the pt weighted 111 pounds. Today, 06/16/18 she weighs 113 pounds.  The pt reports that she decided not to take Dexamethasone in the interim, as she could not remember why she was supposed to take it. She notes that she is eating well and endorses good energy levels. The pt denies having fevers, chills, night sweats, or concerns for infections.  Lab results today (06/16/18) of CBC w/diff and CMP is as follows: all values are WNL except for WBC at 15.4k, HGB at 10.8, HCT at 35.9, RDW at 15.9, PLT at 121k, Lymphs abs at 10.8k, Eosinophils abs at 800, Basophils abs at 200, Glucose at 103, Creatinine at 1.05, Total Protein at 8.2, Albumin at 3.2, GFR at 55. 06/16/18 LDH at 206  On review of systems, pt reports eating well, good energy levels, good appetite, and denies fevers, chills, night sweats, concerns for infections, abdominal pains, leg swelling, and any other symptoms.   MEDICAL HISTORY:  Past Medical History:  Diagnosis Date  . Atrial fibrillation ([place])   . Bleeding from anus   . Cervical cancer ([place])   . Diverticulitis of colon   . GERD (gastroesophageal reflux disease)   . Gout   . Hyperlipidemia   .  Hypertension   . Pulmonary embolism ([facility])   Iron deficiency anemia due to chronic blood loss Paroxysmal atrial fibrillation on Coumadin Diverticulosis of the large and this time Vitamin D deficiency Previous history of pulmonary embolism in 2013 Overactive bladder Osteoarthritis of the knee Chronic back pain History of previous GI bleed in 2011 (while on pradaxa)  SURGICAL HISTORY: Past Surgical History:  Procedure Laterality Date  . ABDOMINAL HYSTERECTOMY     . BACK SURGERY      SOCIAL HISTORY: Social History   Socioeconomic History  . Marital status: Widowed    Spouse name: Not on file  . Number of children: 4  . Years of education: 10  . Highest education level: Not on file  Occupational History  . Occupation: Retired Museum/gallery curator at [employer]  . Financial resource strain: Not on file  . Food insecurity:    Worry: Not on file    Inability: Not on file  . Transportation needs:    Medical: Not on file    Non-medical: Not on file  Tobacco Use  . Smoking status: Former Smoker    Packs/day: 0.10    Years: 58.00    Pack years: 5.80    Types: Cigarettes  . Smokeless tobacco: Never Used  Substance and Sexual Activity  . Alcohol use: No  . Drug use: No  . Sexual activity: Never  Lifestyle  . Physical activity:    Days per week: Not on file    Minutes per session: Not on file  . Stress: Not on file  Relationships  . Social connections:    Talks on phone: Not on file    Gets together: Not on file    Attends religious service: Not on file    Active member of club or organization: Not on file    Attends meetings of clubs or organizations: Not on file    Relationship status: Not on file  . Intimate partner violence:    Fear of current or ex partner: Not on file    Emotionally abused: Not on file    Physically abused: Not on file    Forced sexual activity: Not on file  Other Topics Concern  . Not on file  Social History Narrative   Widowed. Lives alone.  Normally independent of ADLs and ambulation.    FAMILY HISTORY: Family History  Problem Relation Age of Onset  . Heart failure Mother   . Diabetes Brother   . Cancer Neg Hx     ALLERGIES:  has No Known Allergies.  MEDICATIONS:  Current Outpatient Medications  Medication Sig Dispense Refill  . acetaminophen (TYLENOL) 500 MG tablet Take 500 mg by mouth every 6 (six) hours as needed for headache.    Marland Kitchen atenolol (TENORMIN) 50 MG tablet Take 50 mg daily by  mouth.     . B-12, Methylcobalamin, 1000 MCG SUBL Place 1 tablet under the tongue daily. 60 tablet 1  . Cyanocobalamin (B-12) 1000 MCG SUBL Place 1,000 mcg under the tongue daily. 30 each 3  . dexamethasone (DECADRON) 2 MG tablet TAKE 1 TABLET BY MOUTH DAILY WITH BREAKFAST 30 tablet 0  . diclofenac sodium (VOLTAREN) 1 % GEL Apply 2 g topically daily as needed (pain).     . iron polysaccharides (NIFEREX) 150 MG capsule Take 1 capsule (150 mg total) by mouth daily. 30 capsule 2  . potassium chloride SA (K-DUR,KLOR-CON) 20 MEQ tablet Take 1 tablet (20 mEq total) by mouth 2 (two) times  daily for 10 days. (Patient not taking: Reported on 10/23/2017) 20 tablet 0  . warfarin (COUMADIN) 5 MG tablet Take 5-7.5 mg by mouth daily. Take 5 mg on Monday and Tuesday, all other days take 7.5 mg     No current facility-administered medications for this visit.     REVIEW OF SYSTEMS:    A 10+ POINT REVIEW OF SYSTEMS WAS OBTAINED including neurology, dermatology, psychiatry, cardiac, respiratory, lymph, extremities, GI, GU, Musculoskeletal, constitutional, breasts, reproductive, HEENT.  All pertinent positives are noted in the HPI.  All others are negative.   PHYSICAL EXAMINATION: ECOG PERFORMANCE STATUS: 2 - Symptomatic, <50% confined to bed  Vitals:   06/16/18 1444  BP: 140/70  Pulse: 77  Resp: 17  Temp: 98.3 F (36.8 C)  SpO2: 100%   Filed Weights   06/16/18 1444  Weight: 113 lb 12.8 oz (51.6 kg)   .Body mass index is 18.94 kg/m.  GENERAL:alert, in no acute distress and comfortable SKIN: no acute rashes, no significant lesions EYES: conjunctiva are pink and non-injected, sclera anicteric OROPHARYNX: MMM, no exudates, no oropharyngeal erythema or ulceration NECK: supple, no JVD LYMPH: Palpable 2cm left inguinal lymph node. No palpable lymphadenopathy in the cervical or axillary regions LUNGS: clear to auscultation b/l with normal respiratory effort HEART: regular rate & rhythm ABDOMEN:   normoactive bowel sounds , non tender, not distended. No palpable hepatosplenomegaly.  Extremity: no pedal edema PSYCH: alert & oriented x 3 with fluent speech NEURO: no focal motor/sensory deficits   LABORATORY DATA:  I have reviewed the data as listed  . CBC Latest Ref Rng & Units 06/16/2018 05/05/2018 01/19/2018  WBC 4.0 - 10.5 K/uL 15.4(H) 15.3(H) 12.5(H)  Hemoglobin 12.0 - 15.0 g/dL 10.8(L) 11.2(L) 10.7(L)  Hematocrit 36.0 - 46.0 % 35.9(L) 36.5 34.8(L)  Platelets 150 - 400 K/uL 121(L) 117(L) 116(L)    CBC    Component Value Date/Time   WBC 15.4 (H) 06/16/2018 1359   RBC 3.98 06/16/2018 1359   HGB 10.8 (L) 06/16/2018 1359   HGB 9.5 (L) 05/01/2017 0921   HGB 10.4 (L) 12/02/2016 0900   HCT 35.9 (L) 06/16/2018 1359   HCT 32.4 (L) 12/02/2016 0900   PLT 121 (L) 06/16/2018 1359   PLT 130 (L) 05/01/2017 0921   PLT 131 (L) 12/02/2016 0900   MCV 90.2 06/16/2018 1359   MCV 78.8 (L) 12/02/2016 0900   MCH 27.1 06/16/2018 1359   MCHC 30.1 06/16/2018 1359   RDW 15.9 (H) 06/16/2018 1359   RDW 19.1 (H) 12/02/2016 0900   LYMPHSABS 10.8 (H) 06/16/2018 1359   LYMPHSABS 10.6 (H) 12/02/2016 0900   MONOABS 0.8 06/16/2018 1359   MONOABS 1.0 (H) 12/02/2016 0900   EOSABS 0.8 (H) 06/16/2018 1359   EOSABS 0.2 12/02/2016 0900   BASOSABS 0.2 (H) 06/16/2018 1359   BASOSABS 0.0 12/02/2016 0900    CMP Latest Ref Rng & Units 06/16/2018 05/05/2018 01/19/2018  Glucose 70 - 99 mg/dL 103(H) 86 83  BUN 8 - 23 mg/dL 12 14 12   Creatinine 0.44 - 1.00 mg/dL 1.05(H) 0.92 0.94  Sodium 135 - 145 mmol/L 142 141 143  Potassium 3.5 - 5.1 mmol/L 3.9 3.9 3.8  Chloride 98 - 111 mmol/L 109 109 111  CO2 22 - 32 mmol/L 25 23 24   Calcium 8.9 - 10.3 mg/dL 9.6 10.0 9.6  Total Protein 6.5 - 8.1 g/dL 8.2(H) 8.3(H) 7.4  Total Bilirubin 0.3 - 1.2 mg/dL 0.5 0.8 0.5  Alkaline Phos 38 - 126 U/L 66  64 57  AST 15 - 41 U/L 17 18 15   ALT 0 - 44 U/L 8 8 7    . Lab Results  Component Value Date   IRON 46 10/07/2017   TIBC  226 (L) 10/07/2017   IRONPCTSAT 20 (L) 10/07/2017   (Iron and TIBC)  Lab Results  Component Value Date   FERRITIN 393 (H) 10/07/2017        RADIOGRAPHIC STUDIES: I have personally reviewed the radiological images as listed and agreed with the findings in the report.  PET/CT scan 08/18/2016 IMPRESSION: 1. Scattered primarily mild adenopathy most notable in the bilateral inguinal regions, but also with involvement of the left internal mammary chain, left axilla, retroperitoneum, gastrohepatic ligament, and of a pericardial lymph node. Questionable involvement of a small right station 2 lymph node and at the right axilla. Much of this is Deauville 4 disease, with some Deauville 3 and of L2 disease as noted above. 2. Faintly accentuated activity in the spleen without focal activity or overt splenomegaly. This may merit surveillance.  3. There is some Deauville 3 level activity associated with mild soft tissue prominence of the labia majora. This could be simply incidental. 4. Other imaging findings of potential clinical significance: Aortic Atherosclerosis (ICD10-I70.0). Coronary atherosclerosis. Chronic left occipital lobe encephalomalacia. Hepatic cysts and potential scarring in the right hepatic lobe. Pancreas divisum. Chronic degenerative anterolisthesis at L4-5. Bony demineralization.   Electronically Signed   By: Van Clines M.D.   On: 08/18/2016 13:09   ASSESSMENT & PLAN:   85 y.o. female with multiple medical comorbidities with  #1 Low grade Non Hodgkins B cell lymphoma NOS  Flow cytometry suggestive of Cd20+ CD5-CD 10 neg Low grade Non Hodgkins lymphoma. Concern for possible CD5 neg chronic lymphocytic leukemia versus low grade non-Hodgkin's lymphoma NOS  Patient did have some borderline abdominal and inguinal lymphadenopathy on her CT abdomen pelvis in November 2017.  PET/CT 6/25 showed Scattered primarily mild adenopathy most notable in the bilateral  inguinal regions, but also with involvement of the left internal mammary chain, left axilla, retroperitoneum, gastrohepatic ligament, and of a pericardial lymph node. Questionable involvement of a small right station 2 lymph node and at the right axilla. Much of this is Deauville 4 disease, with some Deauville 3 and of L2 disease as noted above. 2. Faintly accentuated activity in the spleen without focal activity or overt splenomegaly.  CLL FISH Prognostic panel -- did not show any mutation typical for CLL. Less likely CD5 neg CLL US guided Bx of inguinal LN was done- however only limited lymphoid tissue was obtained and the biopsy is nondiagnostic. Patient had previously decided to hold off  On an excisional lymph node biopsy  #2 Anemia  - microcytic anemia with some element to iron deficiency.  Iron /ferritin levels much improved after IV iron . Lab Results  Component Value Date   IRON 46 10/07/2017   TIBC 226 (L) 10/07/2017   IRONPCTSAT 20 (L) 10/07/2017   (Iron and TIBC)  Lab Results  Component Value Date   FERRITIN 393 (H) 10/07/2017    #3 B12 deficiency. B12 levels are coming up from 116 now up to 295 to 722 with oral B12 replacement. No evidence of pernicious anemia based on neg antibody testing. B12 - 405 in 04/2017  PLAN: -Discussed pt labwork today, 06/16/18; blood counts and chemistries are stable -The pt shows no clinical or lab progression of her lymphoproliferative disorder at this time.  -No overt indication for further treatment  at this time. -Recommend that the pt continue eating well, consume nutritional supplements, and stay active -Recommend tylenol as needed for headaches, not NSAIDs -Recommend otc loperamide -Recommend using a cane to prevent falls -Continue Vitamin B complex, and 1058mcg Vitamin B12 SL -10/07/17 ferritin >100. No indication for additional IV Iron at this time -Recommended that the pt address her medication management questions with her PCP Dr.  Carol Ada -Will see the pt back in 3 months   #4 Mild thrombocytopenia-  could be related to her NHL -Will continue to monitor.   #5 Weight Loss . Wt Readings from Last 3 Encounters:  06/16/18 113 lb 12.8 oz (51.6 kg)  05/05/18 111 lb 14.4 oz (50.8 kg)  01/19/18 115 lb 4.8 oz (52.3 kg)   -Referral to dietician to help with decreased appetite was previously sent and will continue to monitor for symptoms -She will follow up with our dietician per pt request.  -Recommend trying Carnation instant breakfast or other meal supplements if Boost is not tolerated  -I recommend she continue to work on gaining weight by eating smaller, more frequent meals.   RTC with Dr Irene Limbo with labs in 3 months   All of the patients were answered with apparent satisfaction. The patient knows to call the clinic with any problems, questions or concerns.  The total time spent in the appt was 20 minutes and more than 50% was on counseling and direct patient cares.   Sullivan Lone MD [place] AAHIVMS [facility] Hematology/Oncology Physician [facility]  (Office):       [phone] (Work cell):  [phone] (Fax):           [phone]  I, Baldwin [place], am acting as a scribe for Dr. Sullivan Lone.   .I have reviewed the above documentation for accuracy and completeness, and I agree with the above. Brunetta Genera MD

## 2018-06-16 ENCOUNTER — Other Ambulatory Visit: Payer: Self-pay

## 2018-06-16 ENCOUNTER — Inpatient Hospital Stay: Payer: Medicare HMO | Attending: Hematology | Admitting: Hematology

## 2018-06-16 ENCOUNTER — Inpatient Hospital Stay: Payer: Medicare HMO

## 2018-06-16 VITALS — BP 140/70 | HR 77 | Temp 98.3°F | Resp 17 | Ht 65.0 in | Wt 113.8 lb

## 2018-06-16 DIAGNOSIS — D649 Anemia, unspecified: Secondary | ICD-10-CM

## 2018-06-16 DIAGNOSIS — C8308 Small cell B-cell lymphoma, lymph nodes of multiple sites: Secondary | ICD-10-CM | POA: Diagnosis not present

## 2018-06-16 DIAGNOSIS — Z86711 Personal history of pulmonary embolism: Secondary | ICD-10-CM | POA: Diagnosis not present

## 2018-06-16 DIAGNOSIS — I480 Paroxysmal atrial fibrillation: Secondary | ICD-10-CM | POA: Diagnosis not present

## 2018-06-16 DIAGNOSIS — I10 Essential (primary) hypertension: Secondary | ICD-10-CM

## 2018-06-16 DIAGNOSIS — Z79899 Other long term (current) drug therapy: Secondary | ICD-10-CM | POA: Insufficient documentation

## 2018-06-16 DIAGNOSIS — Z7901 Long term (current) use of anticoagulants: Secondary | ICD-10-CM | POA: Diagnosis not present

## 2018-06-16 DIAGNOSIS — E538 Deficiency of other specified B group vitamins: Secondary | ICD-10-CM | POA: Insufficient documentation

## 2018-06-16 DIAGNOSIS — Z87891 Personal history of nicotine dependence: Secondary | ICD-10-CM

## 2018-06-16 DIAGNOSIS — D696 Thrombocytopenia, unspecified: Secondary | ICD-10-CM | POA: Diagnosis not present

## 2018-06-16 LAB — CMP (CANCER CENTER ONLY)
ALT: 8 U/L (ref 0–44)
AST: 17 U/L (ref 15–41)
Albumin: 3.2 g/dL — ABNORMAL LOW (ref 3.5–5.0)
Alkaline Phosphatase: 66 U/L (ref 38–126)
Anion gap: 8 (ref 5–15)
BUN: 12 mg/dL (ref 8–23)
CO2: 25 mmol/L (ref 22–32)
Calcium: 9.6 mg/dL (ref 8.9–10.3)
Chloride: 109 mmol/L (ref 98–111)
Creatinine: 1.05 mg/dL — ABNORMAL HIGH (ref 0.44–1.00)
GFR, Est AFR Am: 55 mL/min — ABNORMAL LOW (ref >60)
GFR, Estimated: 48 mL/min — ABNORMAL LOW (ref >60)
Glucose, Bld: 103 mg/dL — ABNORMAL HIGH (ref 70–99)
Potassium: 3.9 mmol/L (ref 3.5–5.1)
Sodium: 142 mmol/L (ref 135–145)
Total Bilirubin: 0.5 mg/dL (ref 0.3–1.2)
Total Protein: 8.2 g/dL — ABNORMAL HIGH (ref 6.5–8.1)

## 2018-06-16 LAB — CBC WITH DIFFERENTIAL/PLATELET
Abs Immature Granulocytes: 0 10*3/uL (ref 0.00–0.07)
Basophils Absolute: 0.2 10*3/uL — ABNORMAL HIGH (ref 0.0–0.1)
Basophils Relative: 1 %
Eosinophils Absolute: 0.8 10*3/uL — ABNORMAL HIGH (ref 0.0–0.5)
Eosinophils Relative: 5 %
HCT: 35.9 % — ABNORMAL LOW (ref 36.0–46.0)
Hemoglobin: 10.8 g/dL — ABNORMAL LOW (ref 12.0–15.0)
Lymphocytes Relative: 70 %
Lymphs Abs: 10.8 10*3/uL — ABNORMAL HIGH (ref 0.7–4.0)
MCH: 27.1 pg (ref 26.0–34.0)
MCHC: 30.1 g/dL (ref 30.0–36.0)
MCV: 90.2 fL (ref 80.0–100.0)
Monocytes Absolute: 0.8 10*3/uL (ref 0.1–1.0)
Monocytes Relative: 5 %
Neutro Abs: 2.9 10*3/uL (ref 1.7–17.7)
Neutrophils Relative %: 19 %
Platelets: 121 10*3/uL — ABNORMAL LOW (ref 150–400)
RBC: 3.98 MIL/uL (ref 3.87–5.11)
RDW: 15.9 % — ABNORMAL HIGH (ref 11.5–15.5)
WBC: 15.4 10*3/uL — ABNORMAL HIGH (ref 4.0–10.5)
nRBC: 0 % (ref 0.0–0.2)

## 2018-06-16 LAB — LACTATE DEHYDROGENASE: LDH: 206 U/L — ABNORMAL HIGH (ref 98–192)

## 2018-06-29 DIAGNOSIS — Z7901 Long term (current) use of anticoagulants: Secondary | ICD-10-CM | POA: Diagnosis not present

## 2018-06-29 DIAGNOSIS — Z86711 Personal history of pulmonary embolism: Secondary | ICD-10-CM | POA: Diagnosis not present

## 2018-08-25 DIAGNOSIS — Z86711 Personal history of pulmonary embolism: Secondary | ICD-10-CM | POA: Diagnosis not present

## 2018-08-25 DIAGNOSIS — Z7901 Long term (current) use of anticoagulants: Secondary | ICD-10-CM | POA: Diagnosis not present

## 2018-09-15 ENCOUNTER — Telehealth: Payer: Self-pay | Admitting: Hematology

## 2018-09-15 ENCOUNTER — Other Ambulatory Visit: Payer: Self-pay

## 2018-09-15 ENCOUNTER — Inpatient Hospital Stay (HOSPITAL_BASED_OUTPATIENT_CLINIC_OR_DEPARTMENT_OTHER): Payer: Medicare HMO | Admitting: Hematology

## 2018-09-15 ENCOUNTER — Inpatient Hospital Stay: Payer: Medicare HMO | Attending: Hematology

## 2018-09-15 VITALS — BP 179/87 | HR 81 | Temp 98.9°F | Resp 18 | Ht 65.0 in | Wt 108.3 lb

## 2018-09-15 DIAGNOSIS — R634 Abnormal weight loss: Secondary | ICD-10-CM | POA: Insufficient documentation

## 2018-09-15 DIAGNOSIS — Z87891 Personal history of nicotine dependence: Secondary | ICD-10-CM | POA: Diagnosis not present

## 2018-09-15 DIAGNOSIS — I480 Paroxysmal atrial fibrillation: Secondary | ICD-10-CM | POA: Diagnosis not present

## 2018-09-15 DIAGNOSIS — D500 Iron deficiency anemia secondary to blood loss (chronic): Secondary | ICD-10-CM

## 2018-09-15 DIAGNOSIS — E538 Deficiency of other specified B group vitamins: Secondary | ICD-10-CM | POA: Diagnosis not present

## 2018-09-15 DIAGNOSIS — R21 Rash and other nonspecific skin eruption: Secondary | ICD-10-CM | POA: Diagnosis not present

## 2018-09-15 DIAGNOSIS — Z7901 Long term (current) use of anticoagulants: Secondary | ICD-10-CM

## 2018-09-15 DIAGNOSIS — C8308 Small cell B-cell lymphoma, lymph nodes of multiple sites: Secondary | ICD-10-CM | POA: Insufficient documentation

## 2018-09-15 DIAGNOSIS — D649 Anemia, unspecified: Secondary | ICD-10-CM | POA: Diagnosis not present

## 2018-09-15 DIAGNOSIS — D696 Thrombocytopenia, unspecified: Secondary | ICD-10-CM

## 2018-09-15 LAB — CBC WITH DIFFERENTIAL/PLATELET
Abs Immature Granulocytes: 0.03 10*3/uL (ref 0.00–0.07)
Basophils Absolute: 0.1 10*3/uL (ref 0.0–0.1)
Basophils Relative: 0 %
Eosinophils Absolute: 0.1 10*3/uL (ref 0.0–0.5)
Eosinophils Relative: 1 %
HCT: 32.4 % — ABNORMAL LOW (ref 36.0–46.0)
Hemoglobin: 9.9 g/dL — ABNORMAL LOW (ref 12.0–15.0)
Immature Granulocytes: 0 %
Lymphocytes Relative: 65 %
Lymphs Abs: 10.1 10*3/uL — ABNORMAL HIGH (ref 0.7–4.0)
MCH: 26.1 pg (ref 26.0–34.0)
MCHC: 30.6 g/dL (ref 30.0–36.0)
MCV: 85.3 fL (ref 80.0–100.0)
Monocytes Absolute: 2.4 10*3/uL — ABNORMAL HIGH (ref 0.1–1.0)
Monocytes Relative: 15 %
Neutro Abs: 3 10*3/uL (ref 1.7–7.7)
Neutrophils Relative %: 19 %
Platelets: 160 10*3/uL (ref 150–400)
RBC: 3.8 MIL/uL — ABNORMAL LOW (ref 3.87–5.11)
RDW: 15.5 % (ref 11.5–15.5)
WBC: 15.7 10*3/uL — ABNORMAL HIGH (ref 4.0–10.5)
nRBC: 0 % (ref 0.0–0.2)

## 2018-09-15 LAB — CMP (CANCER CENTER ONLY)
ALT: 10 U/L (ref 0–44)
AST: 18 U/L (ref 15–41)
Albumin: 3.1 g/dL — ABNORMAL LOW (ref 3.5–5.0)
Alkaline Phosphatase: 68 U/L (ref 38–126)
Anion gap: 9 (ref 5–15)
BUN: 9 mg/dL (ref 8–23)
CO2: 25 mmol/L (ref 22–32)
Calcium: 10.4 mg/dL — ABNORMAL HIGH (ref 8.9–10.3)
Chloride: 107 mmol/L (ref 98–111)
Creatinine: 0.98 mg/dL (ref 0.44–1.00)
GFR, Est AFR Am: >60 mL/min (ref >60)
GFR, Estimated: 52 mL/min — ABNORMAL LOW (ref >60)
Glucose, Bld: 90 mg/dL (ref 70–99)
Potassium: 4 mmol/L (ref 3.5–5.1)
Sodium: 141 mmol/L (ref 135–145)
Total Bilirubin: 0.6 mg/dL (ref 0.3–1.2)
Total Protein: 9 g/dL — ABNORMAL HIGH (ref 6.5–8.1)

## 2018-09-15 LAB — LACTATE DEHYDROGENASE: LDH: 242 U/L — ABNORMAL HIGH (ref 98–192)

## 2018-09-15 MED ORDER — HYDROCORTISONE 2 % EX LOTN: 1.0000 "application " | TOPICAL_LOTION | Freq: Two times a day (BID) | CUTANEOUS | 0 refills | Status: DC

## 2018-09-15 NOTE — Telephone Encounter (Signed)
Gave avs and calendar ° °

## 2018-09-15 NOTE — Progress Notes (Signed)
HEMATOLOGY/ONCOLOGY CLINIC NOTE  Date of Service: 09/15/18   Patient Care Team: Carol Ada, MD as PCP - General (Family Medicine)  CHIEF COMPLAINTS/PURPOSE OF CONSULTATION:   F/u for continue mx of low grade NHL Iron def Anemia  HISTORY OF PRESENTING ILLNESS:   Sara Butler is a wonderful 85 y.o. female who has been referred to [place] by Dr .Carol Ada, MD  for evaluation and management of increasing lymphocytosis, anemia and thrombocytopenia.  Patient has a history of hypertension, dyslipidemia, paroxysmal atrial fibrillation on Coumadin, previous history of pulmonary embolism in 2013, history of iron deficiency due to chronic GI losses on oral iron therapy, GERD.  Patient had recent labs with her primary care physician on 05/01/2016 which showed total WBC count of 10.2k with 6.6k lymphocytes, mild anemia with hemoglobin of 11.4 and somewhat microcytic in dialysis with an MCV of 80 and an RDW of 19. She was also noted to have mild thrombocytopenia with a platelet count of 143k.  Patient had workup including getting TSH which is within normal limits. Noted to have significant B12 deficiency with a B12 level of 116. Has been started on oral B12 5000 g daily by her primary care physician. Recommended considering sublingual B12 replacement. Would need to rule out pernicious anemia.   Patient notes that she has been on oral iron replacement on and off.  She reports issues with what is thought to be irritable bowel syndrome for which she is on Myrbetriq.  Patient notes that she had some abdominal discomfort and had a CT of the abdomen and pelvis on 01/01/2016 which showed Prominent bilateral inguinal as well as gastrohepatic lymphadenopathy. These are new from the prior exam of 2014 in the gastrohepatic changes appear to be new from the recent exam from 06/20/2015.  Patient notes no fevers no chills no night sweats no significant unexpected weight loss. She denies any overt GI  bleeding. No other issues with overt nosebleeds gum bleeds or other sources of bleeding  INTERVAL HISTORY:     Sara Butler is her for f/u on her lymphoproliferative disorder -low grade NHL NOS. The patient's last visit with [place] was on 06/16/2018. The pt reports that she is doing well overall.  The pt reports that she has developed a small rash on her arm that is sore and itchy. She has not changed soaps or detergents. She has not changed medications recently. She notes that she hasn't been eating as much recently and that she has lost some weight. She occasionally drinks Boost.  Lab results today (09/15/18) of CBC w/diff and CMP is as follows: all values are WNL except for WNC at 15.7K, RBC at 3.80, hemoglobin at 9.9, HCT at 32.4, lymphs abs at 10.1, monocytes absolute at 2.4, calcium at 10.4, total protein at 9.0, albumin at 3.1, and GFR non af at 52.  On review of systems, pt reports weight loss, nocturia, chills and denies fevers, night sweats, new lumps or bumps, and any other symptoms.    MEDICAL HISTORY:  Past Medical History:  Diagnosis Date  . Atrial fibrillation ([place])   . Bleeding from anus   . Cervical cancer ([place])   . Diverticulitis of colon   . GERD (gastroesophageal reflux disease)   . Gout   . Hyperlipidemia   . Hypertension   . Pulmonary embolism ([facility])   Iron deficiency anemia due to chronic blood loss Paroxysmal atrial fibrillation on Coumadin Diverticulosis of the large and this time Vitamin D  deficiency Previous history of pulmonary embolism in 2013 Overactive bladder Osteoarthritis of the knee Chronic back pain History of previous GI bleed in 2011 (while on pradaxa)  SURGICAL HISTORY: Past Surgical History:  Procedure Laterality Date  . ABDOMINAL HYSTERECTOMY    . BACK SURGERY      SOCIAL HISTORY: Social History   Socioeconomic History  . Marital status: Widowed    Spouse name: Not on file  . Number of children: 4  . Years of education: 24  .  Highest education level: Not on file  Occupational History  . Occupation: Retired Museum/gallery curator at [employer]  . Financial resource strain: Not on file  . Food insecurity    Worry: Not on file    Inability: Not on file  . Transportation needs    Medical: Not on file    Non-medical: Not on file  Tobacco Use  . Smoking status: Former Smoker    Packs/day: 0.10    Years: 58.00    Pack years: 5.80    Types: Cigarettes  . Smokeless tobacco: Never Used  Substance and Sexual Activity  . Alcohol use: No  . Drug use: No  . Sexual activity: Never  Lifestyle  . Physical activity    Days per week: Not on file    Minutes per session: Not on file  . Stress: Not on file  Relationships  . Social Herbalist on phone: Not on file    Gets together: Not on file    Attends religious service: Not on file    Active member of club or organization: Not on file    Attends meetings of clubs or organizations: Not on file    Relationship status: Not on file  . Intimate partner violence    Fear of current or ex partner: Not on file    Emotionally abused: Not on file    Physically abused: Not on file    Forced sexual activity: Not on file  Other Topics Concern  . Not on file  Social History Narrative   Widowed. Lives alone.  Normally independent of ADLs and ambulation.    FAMILY HISTORY: Family History  Problem Relation Age of Onset  . Heart failure Mother   . Diabetes Brother   . Cancer Neg Hx     ALLERGIES:  has No Known Allergies.  MEDICATIONS:  Current Outpatient Medications  Medication Sig Dispense Refill  . acetaminophen (TYLENOL) 500 MG tablet Take 500 mg by mouth every 6 (six) hours as needed for headache.    Marland Kitchen atenolol (TENORMIN) 50 MG tablet Take 50 mg daily by mouth.     . B-12, Methylcobalamin, 1000 MCG SUBL Place 1 tablet under the tongue daily. 60 tablet 1  . Cyanocobalamin (B-12) 1000 MCG SUBL Place 1,000 mcg under the tongue daily. 30 each 3  .  dexamethasone (DECADRON) 2 MG tablet TAKE 1 TABLET BY MOUTH DAILY WITH BREAKFAST 30 tablet 0  . diclofenac sodium (VOLTAREN) 1 % GEL Apply 2 g topically daily as needed (pain).     . iron polysaccharides (NIFEREX) 150 MG capsule Take 1 capsule (150 mg total) by mouth daily. 30 capsule 2  . potassium chloride SA (K-DUR,KLOR-CON) 20 MEQ tablet Take 1 tablet (20 mEq total) by mouth 2 (two) times daily for 10 days. (Patient not taking: Reported on 10/23/2017) 20 tablet 0  . warfarin (COUMADIN) 5 MG tablet Take 5-7.5 mg by mouth daily. Take 5 mg on Monday  and Tuesday, all other days take 7.5 mg     No current facility-administered medications for this visit.     REVIEW OF SYSTEMS:   A 10+ POINT REVIEW OF SYSTEMS WAS OBTAINED including neurology, dermatology, psychiatry, cardiac, respiratory, lymph, extremities, GI, GU, Musculoskeletal, constitutional, breasts, reproductive, HEENT.  All pertinent positives are noted in the HPI.  All others are negative.       PHYSICAL EXAMINATION: ECOG PERFORMANCE STATUS: 2 - Symptomatic, <50% confined to bed  Vitals:   09/15/18 1414  BP: (!) 179/87  Pulse: 81  Resp: 18  Temp: 98.9 F (37.2 C)  SpO2: 99%   Filed Weights   09/15/18 1414  Weight: 108 lb 4.8 oz (49.1 kg)   Body mass index is 18.02 kg/m.  GENERAL:alert, in no acute distress and comfortable SKIN: no acute rashes, no significant lesions EYES: conjunctiva are pink and non-injected, sclera anicteric OROPHARYNX: MMM, no exudates, no oropharyngeal erythema or ulceration NECK: supple, no JVD LYMPH: Palpable 2cm left inguinal lymph node. No palpable lymphadenopathy in the cervical or axillary regions LUNGS: clear to auscultation b/l with normal respiratory effort HEART: regular rate & rhythm ABDOMEN:  normoactive bowel sounds , non tender, not distended. No palpable hepatosplenomegaly.  Extremity: trace pedal edema PSYCH: alert & oriented x 3 with fluent speech NEURO: no focal motor/sensory  deficits    LABORATORY DATA:  I have reviewed the data as listed  CBC Latest Ref Rng & Units 09/15/2018 06/16/2018 05/05/2018  WBC 4.0 - 10.5 K/uL 15.7(H) 15.4(H) 15.3(H)  Hemoglobin 12.0 - 15.0 g/dL 9.9(L) 10.8(L) 11.2(L)  Hematocrit 36.0 - 46.0 % 32.4(L) 35.9(L) 36.5  Platelets 150 - 400 K/uL 160 121(L) 117(L)    CBC    Component Value Date/Time   WBC 15.7 (H) 09/15/2018 1343   RBC 3.80 (L) 09/15/2018 1343   HGB 9.9 (L) 09/15/2018 1343   HGB 9.5 (L) 05/01/2017 0921   HGB 10.4 (L) 12/02/2016 0900   HCT 32.4 (L) 09/15/2018 1343   HCT 32.4 (L) 12/02/2016 0900   PLT 160 09/15/2018 1343   PLT 130 (L) 05/01/2017 0921   PLT 131 (L) 12/02/2016 0900   MCV 85.3 09/15/2018 1343   MCV 78.8 (L) 12/02/2016 0900   MCH 26.1 09/15/2018 1343   MCHC 30.6 09/15/2018 1343   RDW 15.5 09/15/2018 1343   RDW 19.1 (H) 12/02/2016 0900   LYMPHSABS 10.1 (H) 09/15/2018 1343   LYMPHSABS 10.6 (H) 12/02/2016 0900   MONOABS 2.4 (H) 09/15/2018 1343   MONOABS 1.0 (H) 12/02/2016 0900   EOSABS 0.1 09/15/2018 1343   EOSABS 0.2 12/02/2016 0900   BASOSABS 0.1 09/15/2018 1343   BASOSABS 0.0 12/02/2016 0900   CMP Latest Ref Rng & Units 06/16/2018 05/05/2018 01/19/2018  Glucose 70 - 99 mg/dL 103(H) 86 83  BUN 8 - 23 mg/dL 12 14 12   Creatinine 0.44 - 1.00 mg/dL 1.05(H) 0.92 0.94  Sodium 135 - 145 mmol/L 142 141 143  Potassium 3.5 - 5.1 mmol/L 3.9 3.9 3.8  Chloride 98 - 111 mmol/L 109 109 111  CO2 22 - 32 mmol/L 25 23 24   Calcium 8.9 - 10.3 mg/dL 9.6 10.0 9.6  Total Protein 6.5 - 8.1 g/dL 8.2(H) 8.3(H) 7.4  Total Bilirubin 0.3 - 1.2 mg/dL 0.5 0.8 0.5  Alkaline Phos 38 - 126 U/L 66 64 57  AST 15 - 41 U/L 17 18 15   ALT 0 - 44 U/L 8 8 7    Lab Results  Component Value Date  IRON 46 10/07/2017   TIBC 226 (L) 10/07/2017   IRONPCTSAT 20 (L) 10/07/2017   (Iron and TIBC)  Lab Results  Component Value Date   FERRITIN 393 (H) 10/07/2017        RADIOGRAPHIC STUDIES: I have personally reviewed the  radiological images as listed and agreed with the findings in the report.  PET/CT scan 08/18/2016 IMPRESSION: 1. Scattered primarily mild adenopathy most notable in the bilateral inguinal regions, but also with involvement of the left internal mammary chain, left axilla, retroperitoneum, gastrohepatic ligament, and of a pericardial lymph node. Questionable involvement of a small right station 2 lymph node and at the right axilla. Much of this is Deauville 4 disease, with some Deauville 3 and of L2 disease as noted above. 2. Faintly accentuated activity in the spleen without focal activity or overt splenomegaly. This may merit surveillance.  3. There is some Deauville 3 level activity associated with mild soft tissue prominence of the labia majora. This could be simply incidental. 4. Other imaging findings of potential clinical significance: Aortic Atherosclerosis (ICD10-I70.0). Coronary atherosclerosis. Chronic left occipital lobe encephalomalacia. Hepatic cysts and potential scarring in the right hepatic lobe. Pancreas divisum. Chronic degenerative anterolisthesis at L4-5. Bony demineralization.   Electronically Signed   By: Van Clines M.D.   On: 08/18/2016 13:09   ASSESSMENT & PLAN:   85 y.o. female with multiple medical comorbidities with  #1 Low grade Non Hodgkins B cell lymphoma NOS  Flow cytometry suggestive of Cd20+ CD5-CD 10 neg Low grade Non Hodgkins lymphoma. Concern for possible CD5 neg chronic lymphocytic leukemia versus low grade non-Hodgkin's lymphoma NOS  Patient did have some borderline abdominal and inguinal lymphadenopathy on her CT abdomen pelvis in November 2017.  PET/CT 6/25 showed Scattered primarily mild adenopathy most notable in the bilateral inguinal regions, but also with involvement of the left internal mammary chain, left axilla, retroperitoneum, gastrohepatic ligament, and of a pericardial lymph node. Questionable involvement of a small right  station 2 lymph node and at the right axilla. Much of this is Deauville 4 disease, with some Deauville 3 and of L2 disease as noted above. 2. Faintly accentuated activity in the spleen without focal activity or overt splenomegaly.  CLL FISH Prognostic panel -- did not show any mutation typical for CLL. Less likely CD5 neg CLL US guided Bx of inguinal LN was done- however only limited lymphoid tissue was obtained and the biopsy is nondiagnostic. Patient had previously decided to hold off  On an excisional lymph node biopsy  #2 Anemia  - microcytic anemia with some element to iron deficiency.  Iron /ferritin levels much improved after IV iron . Lab Results  Component Value Date   IRON 46 10/07/2017   TIBC 226 (L) 10/07/2017   IRONPCTSAT 20 (L) 10/07/2017   (Iron and TIBC)  Lab Results  Component Value Date   FERRITIN 393 (H) 10/07/2017    #3 B12 deficiency. B12 levels are coming up from 116 now up to 295 to 722 with oral B12 replacement. No evidence of pernicious anemia based on neg antibody testing. B12 - 405 in 04/2017  #4 Mild thrombocytopenia-  could be related to her NHL -Will continue to monitor.  #5 Weight Loss Wt Readings from Last 3 Encounters:  09/15/18 108 lb 4.8 oz (49.1 kg)  06/16/18 113 lb 12.8 oz (51.6 kg)  05/05/18 111 lb 14.4 oz (50.8 kg)    PLAN: -Discussed pt labwork today, 09/15/18; all values are WNL except for [facility] at  15.7K, RBC at 3.80, hemoglobin at 9.9, HCT at 32.4, lymphs abs at 10.1, monocytes absolute at 2.4, calcium at 10.4, total protein at 9.0, albumin at 3.1, and GFR non af at 52. -no clear evidence of symptomatic progression of NHL. Will need to monitor her anemia. -Recommended Hydrocortisone cream for her rash -Discussed referral to dietician -Follow up in 3 months   FOLLOW UP: RTC with Dr Irene Limbo with labs in 3 months    All of the patients were answered with apparent satisfaction. The patient knows to call the clinic with any problems,  questions or concerns.  The total time spent in the appt was 15 minutes and more than 50% was on counseling and direct patient cares.    Sullivan Lone MD MS AAHIVMS [facility] Hematology/Oncology Physician [facility]  (Office):       [phone] (Work cell):  [phone] (Fax):           [phone]  I, Jacqualyn Posey, am acting as a scribe for Dr. Sullivan Lone.   .I have reviewed the above documentation for accuracy and completeness, and I agree with the above. Brunetta Genera MD

## 2018-09-22 DIAGNOSIS — R350 Frequency of micturition: Secondary | ICD-10-CM | POA: Diagnosis not present

## 2018-09-22 DIAGNOSIS — R51 Headache: Secondary | ICD-10-CM | POA: Diagnosis not present

## 2018-09-22 DIAGNOSIS — N3000 Acute cystitis without hematuria: Secondary | ICD-10-CM | POA: Diagnosis not present

## 2018-09-22 DIAGNOSIS — Z7901 Long term (current) use of anticoagulants: Secondary | ICD-10-CM | POA: Diagnosis not present

## 2018-09-22 DIAGNOSIS — I952 Hypotension due to drugs: Secondary | ICD-10-CM | POA: Diagnosis not present

## 2018-10-05 ENCOUNTER — Emergency Department (HOSPITAL_COMMUNITY)
Admission: EM | Admit: 2018-10-05 | Discharge: 2018-10-05 | Disposition: A | Discharge status: Home / Self Care | Payer: Medicare HMO | Attending: Emergency Medicine | Admitting: Emergency Medicine

## 2018-10-05 ENCOUNTER — Encounter (HOSPITAL_COMMUNITY): Payer: Self-pay

## 2018-10-05 ENCOUNTER — Other Ambulatory Visit: Payer: Self-pay

## 2018-10-05 DIAGNOSIS — N3000 Acute cystitis without hematuria: Secondary | ICD-10-CM | POA: Insufficient documentation

## 2018-10-05 DIAGNOSIS — I10 Essential (primary) hypertension: Secondary | ICD-10-CM | POA: Insufficient documentation

## 2018-10-05 DIAGNOSIS — Z87891 Personal history of nicotine dependence: Secondary | ICD-10-CM | POA: Diagnosis not present

## 2018-10-05 DIAGNOSIS — Z8541 Personal history of malignant neoplasm of cervix uteri: Secondary | ICD-10-CM | POA: Insufficient documentation

## 2018-10-05 DIAGNOSIS — Z7901 Long term (current) use of anticoagulants: Secondary | ICD-10-CM | POA: Insufficient documentation

## 2018-10-05 DIAGNOSIS — Z79899 Other long term (current) drug therapy: Secondary | ICD-10-CM | POA: Insufficient documentation

## 2018-10-05 DIAGNOSIS — E876 Hypokalemia: Secondary | ICD-10-CM | POA: Diagnosis not present

## 2018-10-05 DIAGNOSIS — R309 Painful micturition, unspecified: Secondary | ICD-10-CM | POA: Diagnosis present

## 2018-10-05 LAB — BASIC METABOLIC PANEL
Anion gap: 10 (ref 5–15)
BUN: 18 mg/dL (ref 8–23)
CO2: 23 mmol/L (ref 22–32)
Calcium: 10.1 mg/dL (ref 8.9–10.3)
Chloride: 106 mmol/L (ref 98–111)
Creatinine, Ser: 1.02 mg/dL — ABNORMAL HIGH (ref 0.44–1.00)
GFR calc Af Amer: 57 mL/min — ABNORMAL LOW (ref >60)
GFR calc non Af Amer: 49 mL/min — ABNORMAL LOW (ref >60)
Glucose, Bld: 101 mg/dL — ABNORMAL HIGH (ref 70–99)
Potassium: 2.9 mmol/L — ABNORMAL LOW (ref 3.5–5.1)
Sodium: 139 mmol/L (ref 135–145)

## 2018-10-05 LAB — URINALYSIS, ROUTINE W REFLEX MICROSCOPIC
Bilirubin Urine: NEGATIVE
Glucose, UA: NEGATIVE mg/dL
Ketones, ur: NEGATIVE mg/dL
Nitrite: NEGATIVE
Protein, ur: >=300 mg/dL — AB
RBC / HPF: >50 RBC/hpf — ABNORMAL HIGH (ref 0–5)
Specific Gravity, Urine: 1.017 (ref 1.005–1.030)
WBC, UA: >50 WBC/hpf — ABNORMAL HIGH (ref 0–5)
pH: 8 (ref 5.0–8.0)

## 2018-10-05 LAB — CBC
HCT: 31.5 % — ABNORMAL LOW (ref 36.0–46.0)
Hemoglobin: 9.5 g/dL — ABNORMAL LOW (ref 12.0–15.0)
MCH: 26.1 pg (ref 26.0–34.0)
MCHC: 30.2 g/dL (ref 30.0–36.0)
MCV: 86.5 fL (ref 80.0–100.0)
Platelets: 170 10*3/uL (ref 150–400)
RBC: 3.64 MIL/uL — ABNORMAL LOW (ref 3.87–5.11)
RDW: 16 % — ABNORMAL HIGH (ref 11.5–15.5)
WBC: 15.1 10*3/uL — ABNORMAL HIGH (ref 4.0–10.5)
nRBC: 0 % (ref 0.0–0.2)

## 2018-10-05 MED ORDER — PHENAZOPYRIDINE HCL 100 MG PO TABS: 100.0000 mg | ORAL_TABLET | Freq: Three times a day (TID) | ORAL | 0 refills | Status: DC | PRN

## 2018-10-05 MED ORDER — SODIUM CHLORIDE 0.9 % IV SOLN
1.0000 g | Freq: Once | INTRAVENOUS | Status: AC
  Administered 2018-10-05: 19:00:00 1 g via INTRAVENOUS
  Filled 2018-10-05: qty 10

## 2018-10-05 MED ORDER — POTASSIUM CHLORIDE 20 MEQ PO PACK
20.0000 meq | PACK | Freq: Once | ORAL | Status: AC
  Administered 2018-10-05: 20 meq via ORAL
  Filled 2018-10-05: qty 1

## 2018-10-05 MED ORDER — CEPHALEXIN 250 MG PO CAPS: 250.0000 mg | ORAL_CAPSULE | Freq: Three times a day (TID) | ORAL | 0 refills | Status: DC

## 2018-10-05 MED ORDER — PHENAZOPYRIDINE HCL 100 MG PO TABS
100.0000 mg | ORAL_TABLET | Freq: Three times a day (TID) | ORAL | Status: DC
  Administered 2018-10-05: 100 mg via ORAL
  Filled 2018-10-05: qty 1

## 2018-10-05 NOTE — ED Provider Notes (Signed)
[place] DEPT Provider Note   CSN: [id number] Arrival date & time: 10/05/18  1600    History   Chief Complaint Chief Complaint  Patient presents with  . Abdominal Pain    HPI Sara Butler is a 85 y.o. female.     HPI Pt states about 4-5 days ago she started having urinary sx.  She has been having pain and burning when she urinates.  She also has pain that goes towards her back.  No vomiting.  She denies any fevers at home but was told she had a low grade temp.    No weakness.   No trouble with her appetite. Past Medical History:  Diagnosis Date  . Atrial fibrillation ([place])   . Bleeding from anus   . Cervical cancer ([place])   . Diverticulitis of colon   . GERD (gastroesophageal reflux disease)   . Gout   . Hyperlipidemia   . Hypertension   . Pulmonary embolism [facility])     Patient Active Problem List   Diagnosis Date Noted  . Iron deficiency anemia 05/02/2017  . Chest pain 07/04/2014  . Pseudogout of right wrist 07/04/2014  . Acute gout   . Pulmonary emboli ([place]) 04/26/2011  . Hypertension   . Atrial fibrillation ([place])   . Hyperlipidemia   . GERD (gastroesophageal reflux disease)     Past Surgical History:  Procedure Laterality Date  . ABDOMINAL HYSTERECTOMY    . BACK SURGERY       OB History   No obstetric history on file.      Home Medications    Prior to Admission medications   Medication Sig Start Date End Date Taking? Authorizing Provider  acetaminophen (TYLENOL) 500 MG tablet Take 500 mg by mouth every 6 (six) hours as needed for headache.    [provider]  atenolol (TENORMIN) 50 MG tablet Take 50 mg daily by mouth.     [provider]  B-12, Methylcobalamin, 1000 MCG SUBL Place 1 tablet under the tongue daily. 02/11/18   Brunetta Genera, MD  cephALEXin (KEFLEX) 250 MG capsule Take 1 capsule (250 mg total) by mouth 3 (three) times daily. 10/05/18   Dorie Rank, MD  Cyanocobalamin (B-12) 1000  MCG SUBL Place 1,000 mcg under the tongue daily. 03/04/17   Brunetta Genera, MD  dexamethasone (DECADRON) 2 MG tablet TAKE 1 TABLET BY MOUTH DAILY WITH BREAKFAST 06/14/18   Brunetta Genera, MD  diclofenac sodium (VOLTAREN) 1 % GEL Apply 2 g topically daily as needed (pain).     [provider]  HYDROCORTISONE, TOPICAL, 2 % LOTN Apply 1 application topically 2 (two) times daily. To area of eczematoid rash and pruritus on upper extremities 09/15/18   Brunetta Genera, MD  iron polysaccharides (NIFEREX) 150 MG capsule Take 1 capsule (150 mg total) by mouth daily. 03/04/17   Brunetta Genera, MD  phenazopyridine (PYRIDIUM) 100 MG tablet Take 1 tablet (100 mg total) by mouth 3 (three) times daily as needed for pain. 10/05/18   Dorie Rank, MD  potassium chloride SA (K-DUR,KLOR-CON) 20 MEQ tablet Take 1 tablet (20 mEq total) by mouth 2 (two) times daily for 10 days. Patient not taking: Reported on 10/23/2017 07/07/17 07/17/17  Brunetta Genera, MD  warfarin (COUMADIN) 5 MG tablet Take 5-7.5 mg by mouth daily. Take 5 mg on Monday and Tuesday, all other days take 7.5 mg    [provider]    Family History Family  History  Problem Relation Age of Onset  . Heart failure Mother   . Diabetes Brother   . Cancer Neg Hx     Social History Social History   Tobacco Use  . Smoking status: Former Smoker    Packs/day: 0.10    Years: 58.00    Pack years: 5.80    Types: Cigarettes  . Smokeless tobacco: Never Used  Substance Use Topics  . Alcohol use: No  . Drug use: No     Allergies   Patient has no known allergies.   Review of Systems Review of Systems  All other systems reviewed and are negative.    Physical Exam Updated Vital Signs BP (!) 161/86   Pulse 83   Temp 100.2 F (37.9 C) (Oral)   Resp 15   Ht 1.676 m (5\' 6" )   Wt 49.9 kg   SpO2 100%   BMI 17.75 kg/m   Physical Exam Vitals signs and nursing note reviewed.  Constitutional:      General:  She is not in acute distress.    Appearance: She is well-developed.  HENT:     Head: Normocephalic and atraumatic.     Right Ear: External ear normal.     Left Ear: External ear normal.  Eyes:     General: No scleral icterus.       Right eye: No discharge.        Left eye: No discharge.     Conjunctiva/sclera: Conjunctivae normal.  Neck:     Musculoskeletal: Neck supple.     Trachea: No tracheal deviation.  Cardiovascular:     Rate and Rhythm: Normal rate and regular rhythm.  Pulmonary:     Effort: Pulmonary effort is normal. No respiratory distress.     Breath sounds: Normal breath sounds. No stridor. No wheezing or rales.  Abdominal:     General: Bowel sounds are normal. There is no distension.     Palpations: Abdomen is soft.     Tenderness: There is no abdominal tenderness. There is no guarding or rebound.  Musculoskeletal:        General: No tenderness.  Skin:    General: Skin is warm and dry.     Findings: No rash.  Neurological:     Mental Status: She is alert.     Cranial Nerves: No cranial nerve deficit (no facial droop, extraocular movements intact, no slurred speech).     Sensory: No sensory deficit.     Motor: No abnormal muscle tone or seizure activity.     Coordination: Coordination normal.      ED Treatments / Results  Labs (all labs ordered are listed, but only abnormal results are displayed) Labs Reviewed  URINALYSIS, ROUTINE W REFLEX MICROSCOPIC - Abnormal; Notable for the following components:      Result Value   Color, Urine AMBER (*)    APPearance TURBID (*)    Hgb urine dipstick MODERATE (*)    Protein, ur >=300 (*)    Leukocytes,Ua LARGE (*)    RBC / HPF >50 (*)    WBC, UA >50 (*)    Bacteria, UA MANY (*)    All other components within normal limits  BASIC METABOLIC PANEL - Abnormal; Notable for the following components:   Potassium 2.9 (*)    Glucose, Bld 101 (*)    Creatinine, Ser 1.02 (*)    GFR calc non Af Amer 49 (*)    GFR calc Af  Amer 57 (*)  All other components within normal limits  CBC - Abnormal; Notable for the following components:   WBC 15.1 (*)    RBC 3.64 (*)    Hemoglobin 9.5 (*)    HCT 31.5 (*)    RDW 16.0 (*)    All other components within normal limits  URINE CULTURE    EKG None  Radiology No results found.  Procedures Procedures (including critical care time)  Medications Ordered in ED Medications  phenazopyridine (PYRIDIUM) tablet 100 mg (100 mg Oral Given 10/05/18 1912)  cefTRIAXone (ROCEPHIN) 1 g in sodium chloride 0.9 % 100 mL IVPB (0 g Intravenous Stopped 10/05/18 1949)  potassium chloride (KLOR-CON) packet 20 mEq (20 mEq Oral Given 10/05/18 1911)     Initial Impression / Assessment and Plan / ED Course  I have reviewed the triage vital signs and the nursing notes.  Pertinent labs & imaging results that were available during my care of the patient were reviewed by me and considered in my medical decision making (see chart for details).  Clinical Course as of Oct 04 2025  Tue Oct 05, 2018  1852 WBC elevated but similar to previous values.  Unchanged.     [JK]    Clinical Course User Index [JK] Dorie Rank, MD     Patient's laboratory tests are consistent with a urinary tract infection.  She does have an elevated white blood cell count but this appears chronic.  Electrolyte panels did show hypokalemia.  Patient was given potassium while she was in the ED.  Patient improved after dose of antibiotics and Pyridium.  She has been able to ambulate without difficulty.  She is not feeling feverish or lightheaded.  Despite her low-grade temperature I do think she appears stable for outpatient management.  Patient understands to return to the emergency room if she starts having worsening symptoms including fevers chills vomiting.  Final Clinical Impressions(s) / ED Diagnoses   Final diagnoses:  Acute cystitis without hematuria    ED Discharge Orders         Ordered    cephALEXin  (KEFLEX) 250 MG capsule  3 times daily     10/05/18 2025    phenazopyridine (PYRIDIUM) 100 MG tablet  3 times daily PRN     10/05/18 2025           Dorie Rank, MD 10/05/18 2027

## 2018-10-05 NOTE — ED Notes (Signed)
Pt was verbalized discharge instructions. Pt had no further questions at this time. NAD. 

## 2018-10-05 NOTE — Discharge Instructions (Signed)
Take the antibiotics as prescribed.  Return to the ER for fever worsening symptoms.  Follow-up with your primary care doctor next week to make sure the infection has resolved

## 2018-10-05 NOTE — ED Notes (Signed)
Pt ambulated to the bathroom without assistance. Gait steady  

## 2018-10-05 NOTE — ED Triage Notes (Signed)
Pt reports lower abdominal pain and burning with urination x 1 week.

## 2018-10-08 LAB — URINE CULTURE: Culture: >=100000 — AB

## 2018-10-09 ENCOUNTER — Telehealth: Payer: Self-pay | Admitting: Emergency Medicine

## 2018-10-09 NOTE — Telephone Encounter (Signed)
Post ED Visit - Positive Culture Follow-up  Culture report reviewed by antimicrobial stewardship pharmacist: [place] Team []  Elenor Quinones, Pharm.D. []  Heide Guile, Pharm.D., BCPS AQ-ID []  Parks Neptune, Pharm.D., BCPS []  Alycia Rossetti, Pharm.D., BCPS []  [place], Pharm.D., BCPS, AAHIVP []  Legrand Como, Pharm.D., BCPS, AAHIVP []  Salome Arnt, PharmD, BCPS []  Johnnette Gourd, PharmD, BCPS []  Hughes Better, PharmD, BCPS []  Leeroy Cha, PharmD []  Laqueta Linden, PharmD, BCPS []  Albertina Parr, PharmD  [place] Team []  Leodis Sias, PharmD []  Lindell Spar, PharmD []  Royetta Asal, PharmD []  Graylin Shiver, Rph []  Rema Fendt) Glennon Mac, PharmD []  Arlyn Dunning, PharmD [x]  Netta Cedars, PharmD []  Dia Sitter, PharmD []  Leone Haven, PharmD []  Gretta Arab, PharmD []  Theodis Shove, PharmD []  Peggyann Juba, PharmD []  Reuel Boom, PharmD   Positive urine culture Treated with Cephalexin, organism sensitive to the same and no further patient follow-up is required at this time.  Sara Butler 10/09/2018, 2:47 PM

## 2018-10-19 DIAGNOSIS — Z09 Encounter for follow-up examination after completed treatment for conditions other than malignant neoplasm: Secondary | ICD-10-CM | POA: Diagnosis not present

## 2018-10-19 DIAGNOSIS — R350 Frequency of micturition: Secondary | ICD-10-CM | POA: Diagnosis not present

## 2018-10-19 DIAGNOSIS — Z87440 Personal history of urinary (tract) infections: Secondary | ICD-10-CM | POA: Diagnosis not present

## 2018-10-19 DIAGNOSIS — Z7901 Long term (current) use of anticoagulants: Secondary | ICD-10-CM | POA: Diagnosis not present

## 2018-10-19 DIAGNOSIS — Z86711 Personal history of pulmonary embolism: Secondary | ICD-10-CM | POA: Diagnosis not present

## 2018-10-19 DIAGNOSIS — I10 Essential (primary) hypertension: Secondary | ICD-10-CM | POA: Diagnosis not present

## 2018-11-05 DIAGNOSIS — E785 Hyperlipidemia, unspecified: Secondary | ICD-10-CM | POA: Diagnosis not present

## 2018-11-05 DIAGNOSIS — I480 Paroxysmal atrial fibrillation: Secondary | ICD-10-CM | POA: Diagnosis not present

## 2018-11-05 DIAGNOSIS — R944 Abnormal results of kidney function studies: Secondary | ICD-10-CM | POA: Diagnosis not present

## 2018-11-05 DIAGNOSIS — I10 Essential (primary) hypertension: Secondary | ICD-10-CM | POA: Diagnosis not present

## 2018-11-05 DIAGNOSIS — M179 Osteoarthritis of knee, unspecified: Secondary | ICD-10-CM | POA: Diagnosis not present

## 2018-11-05 DIAGNOSIS — D500 Iron deficiency anemia secondary to blood loss (chronic): Secondary | ICD-10-CM | POA: Diagnosis not present

## 2018-12-07 DIAGNOSIS — R300 Dysuria: Secondary | ICD-10-CM | POA: Diagnosis not present

## 2018-12-16 NOTE — Progress Notes (Signed)
HEMATOLOGY/ONCOLOGY CLINIC NOTE  Date of Service: 12/17/18   Patient Care Team: Carol Ada, MD as PCP - General (Family Medicine)  CHIEF COMPLAINTS/PURPOSE OF CONSULTATION:   F/u for continue mx of low grade NHL Iron def Anemia  HISTORY OF PRESENTING ILLNESS:   Sara Butler is a wonderful 85 y.o. female who has been referred to [place] by Dr .Carol Ada, MD  for evaluation and management of increasing lymphocytosis, anemia and thrombocytopenia.  Patient has a history of hypertension, dyslipidemia, paroxysmal atrial fibrillation on Coumadin, previous history of pulmonary embolism in 2013, history of iron deficiency due to chronic GI losses on oral iron therapy, GERD.  Patient had recent labs with her primary care physician on 05/01/2016 which showed total WBC count of 10.2k with 6.6k lymphocytes, mild anemia with hemoglobin of 11.4 and somewhat microcytic in dialysis with an MCV of 80 and an RDW of 19. She was also noted to have mild thrombocytopenia with a platelet count of 143k.  Patient had workup including getting TSH which is within normal limits. Noted to have significant B12 deficiency with a B12 level of 116. Has been started on oral B12 5000 g daily by her primary care physician. Recommended considering sublingual B12 replacement. Would need to rule out pernicious anemia.   Patient notes that she has been on oral iron replacement on and off.  She reports issues with what is thought to be irritable bowel syndrome for which she is on Myrbetriq.  Patient notes that she had some abdominal discomfort and had a CT of the abdomen and pelvis on 01/01/2016 which showed Prominent bilateral inguinal as well as gastrohepatic lymphadenopathy. These are new from the prior exam of 2014 in the gastrohepatic changes appear to be new from the recent exam from 06/20/2015.  Patient notes no fevers no chills no night sweats no significant unexpected weight loss. She denies any overt GI  bleeding. No other issues with overt nosebleeds gum bleeds or other sources of bleeding  INTERVAL HISTORY:     NASHANTI Butler is her for f/u on her lymphoproliferative disorder -low grade NHL NOS. The patient's last visit with [place] was on 09/15/2018. The pt reports that she is doing well overall.  The pt reports she is no longer taking warfarin.  She was admitted to the ER on 10/05/18 for Acute cystitis without hematuria Hypokalemia.  Pt states that she feels itchy some times.   She expressed that she is having short term memory problems  She it taking her vitamins and drinking Ensure   Lab results today (12/17/18) of CBC w/diff and CMP is as follows: all values are WNL except for WBC at 12.1, Hemoglobin at 10.4, HCT at 34.2, RDW at 16.9, Platelets at 143, Lymphs Abs at 8.3, Monocytes Absolutes at 1.3,Creatinine at 1.04, Total Protein at 8.8, Albumin at 3.3, AST at 14, GFR, Est Non Af Am at 48, GFR, Est Af Am at 56 (12/17/18) Iron and TIBC: Iron at 22, Saturation Ratios at 9 PENDING Vitamin B12   On review of systems, pt reports rashes and itchy legs, headaches and denies breathing problems, new lumps or bumps, abdominal pain, leg swelling and any other symptoms.    MEDICAL HISTORY:  Past Medical History:  Diagnosis Date   Atrial fibrillation ([place])    Bleeding from anus    Cervical cancer ([place])    Diverticulitis of colon    GERD (gastroesophageal reflux disease)    Gout    Hyperlipidemia  Hypertension    Pulmonary embolism ([facility])   Iron deficiency anemia due to chronic blood loss Paroxysmal atrial fibrillation on Coumadin Diverticulosis of the large and this time Vitamin D deficiency Previous history of pulmonary embolism in 2013 Overactive bladder Osteoarthritis of the knee Chronic back pain History of previous GI bleed in 2011 (while on pradaxa)  SURGICAL HISTORY: Past Surgical History:  Procedure Laterality Date   ABDOMINAL HYSTERECTOMY     BACK SURGERY       SOCIAL HISTORY: Social History   Socioeconomic History   Marital status: Widowed    Spouse name: Not on file   Number of children: 4   Years of education: 59   Highest education level: Not on file  Occupational History   Occupation: Retired Museum/gallery curator at [employer] resource strain: Not on file   Food insecurity    Worry: Not on file    Inability: Not on Lexicographer needs    Medical: Not on file    Non-medical: Not on file  Tobacco Use   Smoking status: Former Smoker    Packs/day: 0.10    Years: 58.00    Pack years: 5.80    Types: Cigarettes   Smokeless tobacco: Never Used  Substance and Sexual Activity   Alcohol use: No   Drug use: No   Sexual activity: Never  Lifestyle   Physical activity    Days per week: Not on file    Minutes per session: Not on file   Stress: Not on file  Relationships   Social connections    Talks on phone: Not on file    Gets together: Not on file    Attends religious service: Not on file    Active member of club or organization: Not on file    Attends meetings of clubs or organizations: Not on file    Relationship status: Not on file   Intimate partner violence    Fear of current or ex partner: Not on file    Emotionally abused: Not on file    Physically abused: Not on file    Forced sexual activity: Not on file  Other Topics Concern   Not on file  Social History Narrative   Widowed. Lives alone.  Normally independent of ADLs and ambulation.    FAMILY HISTORY: Family History  Problem Relation Age of Onset   Heart failure Mother    Diabetes Brother    Cancer Neg Hx     ALLERGIES:  has No Known Allergies.  MEDICATIONS:  Current Outpatient Medications  Medication Sig Dispense Refill   acetaminophen (TYLENOL) 500 MG tablet Take 500 mg by mouth every 6 (six) hours as needed for headache.     atenolol (TENORMIN) 50 MG tablet Take 50 mg daily by mouth.      B-12,  Methylcobalamin, 1000 MCG SUBL Place 1 tablet under the tongue daily. 60 tablet 1   cephALEXin (KEFLEX) 250 MG capsule Take 1 capsule (250 mg total) by mouth 3 (three) times daily. 21 capsule 0   Cyanocobalamin (B-12) 1000 MCG SUBL Place 1,000 mcg under the tongue daily. 30 each 3   dexamethasone (DECADRON) 2 MG tablet TAKE 1 TABLET BY MOUTH DAILY WITH BREAKFAST 30 tablet 0   diclofenac sodium (VOLTAREN) 1 % GEL Apply 2 g topically daily as needed (pain).      HYDROCORTISONE, TOPICAL, 2 % LOTN Apply 1 application topically 2 (two) times daily. To area of  eczematoid rash and pruritus on upper extremities 30 mL 0   iron polysaccharides (NIFEREX) 150 MG capsule Take 1 capsule (150 mg total) by mouth daily. 30 capsule 2   phenazopyridine (PYRIDIUM) 100 MG tablet Take 1 tablet (100 mg total) by mouth 3 (three) times daily as needed for pain. 15 tablet 0   potassium chloride SA (K-DUR,KLOR-CON) 20 MEQ tablet Take 1 tablet (20 mEq total) by mouth 2 (two) times daily for 10 days. (Patient not taking: Reported on 10/23/2017) 20 tablet 0   warfarin (COUMADIN) 5 MG tablet Take 5-7.5 mg by mouth daily. Take 5 mg on Monday and Tuesday, all other days take 7.5 mg     No current facility-administered medications for this visit.     REVIEW OF SYSTEMS:   A 10+ POINT REVIEW OF SYSTEMS WAS OBTAINED including neurology, dermatology, psychiatry, cardiac, respiratory, lymph, extremities, GI, GU, Musculoskeletal, constitutional, breasts, reproductive, HEENT.  All pertinent positives are noted in the HPI.  All others are negative.        PHYSICAL EXAMINATION: LYMPH: Palpable 2cm left inguinal lymph node. ECOG FS:2 - Symptomatic, <50% confined to bed  There were no vitals filed for this visit. Wt Readings from Last 3 Encounters:  10/05/18 110 lb (49.9 kg)  09/15/18 108 lb 4.8 oz (49.1 kg)  06/16/18 113 lb 12.8 oz (51.6 kg)   There is no height or weight on file to calculate BMI.    GENERAL:alert, in no  acute distress and comfortable SKIN: no acute rashes, no significant lesions EYES: conjunctiva are pink and non-injected, sclera anicteric OROPHARYNX: MMM, no exudates, no oropharyngeal erythema or ulceration NECK: supple, no JVD small lymphs on right side of neck, 1cm in size LYMPH:  no palpable lymphadenopathy in the cervical, axillary or inguinal regions LUNGS: clear to auscultation b/l with normal respiratory effort HEART: regular rate & rhythm ABDOMEN:  normoactive bowel sounds , non tender, not distended. Extremity: no pedal edema PSYCH: alert & oriented x 3 with fluent speech NEURO: no focal motor/sensory deficits    LABORATORY DATA:  I have reviewed the data as listed  CBC Latest Ref Rng & Units 10/05/2018 09/15/2018 06/16/2018  WBC 4.0 - 10.5 K/uL 15.1(H) 15.7(H) 15.4(H)  Hemoglobin 12.0 - 15.0 g/dL 9.5(L) 9.9(L) 10.8(L)  Hematocrit 36.0 - 46.0 % 31.5(L) 32.4(L) 35.9(L)  Platelets 150 - 400 K/uL 170 160 121(L)    CBC    Component Value Date/Time   WBC 15.1 (H) 10/05/2018 1654   RBC 3.64 (L) 10/05/2018 1654   HGB 9.5 (L) 10/05/2018 1654   HGB 9.5 (L) 05/01/2017 0921   HGB 10.4 (L) 12/02/2016 0900   HCT 31.5 (L) 10/05/2018 1654   HCT 32.4 (L) 12/02/2016 0900   PLT 170 10/05/2018 1654   PLT 130 (L) 05/01/2017 0921   PLT 131 (L) 12/02/2016 0900   MCV 86.5 10/05/2018 1654   MCV 78.8 (L) 12/02/2016 0900   MCH 26.1 10/05/2018 1654   MCHC 30.2 10/05/2018 1654   RDW 16.0 (H) 10/05/2018 1654   RDW 19.1 (H) 12/02/2016 0900   LYMPHSABS 10.1 (H) 09/15/2018 1343   LYMPHSABS 10.6 (H) 12/02/2016 0900   MONOABS 2.4 (H) 09/15/2018 1343   MONOABS 1.0 (H) 12/02/2016 0900   EOSABS 0.1 09/15/2018 1343   EOSABS 0.2 12/02/2016 0900   BASOSABS 0.1 09/15/2018 1343   BASOSABS 0.0 12/02/2016 0900   CMP Latest Ref Rng & Units 10/05/2018 09/15/2018 06/16/2018  Glucose 70 - 99 mg/dL 101(H) 90 103(H)  BUN  8 - 23 mg/dL 18 9 12   Creatinine 0.44 - 1.00 mg/dL 1.02(H) 0.98 1.05(H)  Sodium 135  - 145 mmol/L 139 141 142  Potassium 3.5 - 5.1 mmol/L 2.9(L) 4.0 3.9  Chloride 98 - 111 mmol/L 106 107 109  CO2 22 - 32 mmol/L 23 25 25   Calcium 8.9 - 10.3 mg/dL 10.1 10.4(H) 9.6  Total Protein 6.5 - 8.1 g/dL - 9.0(H) 8.2(H)  Total Bilirubin 0.3 - 1.2 mg/dL - 0.6 0.5  Alkaline Phos 38 - 126 U/L - 68 66  AST 15 - 41 U/L - 18 17  ALT 0 - 44 U/L - 10 8   Lab Results  Component Value Date   IRON 46 10/07/2017   TIBC 226 (L) 10/07/2017   IRONPCTSAT 20 (L) 10/07/2017   (Iron and TIBC)    Lab Results  Component Value Date   FERRITIN 393 (H) 10/07/2017        RADIOGRAPHIC STUDIES: I have personally reviewed the radiological images as listed and agreed with the findings in the report.  PET/CT scan 08/18/2016 IMPRESSION: 1. Scattered primarily mild adenopathy most notable in the bilateral inguinal regions, but also with involvement of the left internal mammary chain, left axilla, retroperitoneum, gastrohepatic ligament, and of a pericardial lymph node. Questionable involvement of a small right station 2 lymph node and at the right axilla. Much of this is Deauville 4 disease, with some Deauville 3 and of L2 disease as noted above. 2. Faintly accentuated activity in the spleen without focal activity or overt splenomegaly. This may merit surveillance.  3. There is some Deauville 3 level activity associated with mild soft tissue prominence of the labia majora. This could be simply incidental. 4. Other imaging findings of potential clinical significance: Aortic Atherosclerosis (ICD10-I70.0). Coronary atherosclerosis. Chronic left occipital lobe encephalomalacia. Hepatic cysts and potential scarring in the right hepatic lobe. Pancreas divisum. Chronic degenerative anterolisthesis at L4-5. Bony demineralization.   Electronically Signed   By: Van Clines M.D.   On: 08/18/2016 13:09   ASSESSMENT & PLAN:   85 y.o. female with multiple medical comorbidities with  #1 Low grade  Non Hodgkins B cell lymphoma NOS  Flow cytometry suggestive of Cd20+ CD5-CD 10 neg Low grade Non Hodgkins lymphoma. Concern for possible CD5 neg chronic lymphocytic leukemia versus low grade non-Hodgkin's lymphoma NOS  Patient did have some borderline abdominal and inguinal lymphadenopathy on her CT abdomen pelvis in November 2017.  PET/CT 6/25 showed Scattered primarily mild adenopathy most notable in the bilateral inguinal regions, but also with involvement of the left internal mammary chain, left axilla, retroperitoneum, gastrohepatic ligament, and of a pericardial lymph node. Questionable involvement of a small right station 2 lymph node and at the right axilla. Much of this is Deauville 4 disease, with some Deauville 3 and of L2 disease as noted above. 2. Faintly accentuated activity in the spleen without focal activity or overt splenomegaly.  CLL FISH Prognostic panel -- did not show any mutation typical for CLL. Less likely CD5 neg CLL US guided Bx of inguinal LN was done- however only limited lymphoid tissue was obtained and the biopsy is nondiagnostic. Patient had previously decided to hold off  On an excisional lymph node biopsy  #2 Anemia  - microcytic anemia with some element to iron deficiency.  Iron /ferritin levels much improved after IV iron . Lab Results  Component Value Date   IRON 22 (L) 12/17/2018   TIBC 246 12/17/2018   IRONPCTSAT 9 (L) 12/17/2018   (  Iron and TIBC)  Lab Results  Component Value Date   FERRITIN 230 12/17/2018    #3 B12 deficiency. B12 levels are coming up from 116 now up to 295 to 722 with oral B12 replacement. No evidence of pernicious anemia based on neg antibody testing. B12 - 705  #4 Mild thrombocytopenia-  could be related to her NHL -Will continue to monitor.  #5 Weight Loss Wt Readings from Last 3 Encounters:  12/17/18 105 lb 1.6 oz (47.7 kg)  10/05/18 110 lb (49.9 kg)  09/15/18 108 lb 4.8 oz (49.1 kg)   PLAN:  -Discussed pt  labwork today, 12/17/18; WBC at 12.1, Hemoglobin at 10.4, HCT at 34.2, RDW at 16.9, Platelets at 143, Lymphs Abs at 8.3, Monocytes Absolutes at 1.3,Creatinine at 1.04, Total Protein at 8.8, Albumin at 3.3, AST at 14, GFR, Est Non Af Am at 48, GFR, Est Af Am at 56 (12/17/18) Iron and TIBC: Iron at 22, Saturation Ratios at 9 -Vitamin B12- 705 -(12/17/2018) Discussed that Hemoglobin has improved - (12/17/2018) Discussed lymphocytes have improved  -Advised to continue to follow up with Dr. Tamala Julian (PCP) about Acute cystitis without hematuria and Hypokalemia. She was prescribed  Keflex in the hospital. PCP has referred her to a urologist.  -(12/17/2018) Discussed that LDH has improved -Advised that lymphoma/chronic leukemia shows no overt signs of significant/symptomatic progression. -Recommend having her granddaughter over see medical care since she his have difficulty remember information.   FOLLOW UP: RTC with Dr Irene Limbo with labs in 4 months     The total time spent in the appt was 25 minutes and more than 50% was on counseling and direct patient cares.  All of the patient's questions were answered with apparent satisfaction. The patient knows to call the clinic with any problems, questions or concerns.     Sullivan Lone MD [place] AAHIVMS [facility] Hematology/Oncology Physician [facility]  (Office):       [phone] (Work cell):  [phone] (Fax):           [phone]  I, Scot Dock, am acting as a scribe for Dr. Sullivan Lone.   .I have reviewed the above documentation for accuracy and completeness, and I agree with the above. Brunetta Genera MD

## 2018-12-17 ENCOUNTER — Telehealth: Payer: Self-pay | Admitting: Hematology

## 2018-12-17 ENCOUNTER — Other Ambulatory Visit: Payer: Self-pay

## 2018-12-17 ENCOUNTER — Inpatient Hospital Stay: Payer: Medicare HMO

## 2018-12-17 ENCOUNTER — Inpatient Hospital Stay: Payer: Medicare HMO | Attending: Hematology | Admitting: Hematology

## 2018-12-17 VITALS — BP 141/82 | HR 85 | Temp 98.2°F | Resp 18 | Ht 66.0 in | Wt 105.1 lb

## 2018-12-17 DIAGNOSIS — N3000 Acute cystitis without hematuria: Secondary | ICD-10-CM | POA: Insufficient documentation

## 2018-12-17 DIAGNOSIS — D509 Iron deficiency anemia, unspecified: Secondary | ICD-10-CM | POA: Diagnosis not present

## 2018-12-17 DIAGNOSIS — R634 Abnormal weight loss: Secondary | ICD-10-CM | POA: Insufficient documentation

## 2018-12-17 DIAGNOSIS — C8308 Small cell B-cell lymphoma, lymph nodes of multiple sites: Secondary | ICD-10-CM

## 2018-12-17 DIAGNOSIS — D649 Anemia, unspecified: Secondary | ICD-10-CM

## 2018-12-17 DIAGNOSIS — D500 Iron deficiency anemia secondary to blood loss (chronic): Secondary | ICD-10-CM

## 2018-12-17 DIAGNOSIS — E538 Deficiency of other specified B group vitamins: Secondary | ICD-10-CM | POA: Diagnosis not present

## 2018-12-17 DIAGNOSIS — C8518 Unspecified B-cell lymphoma, lymph nodes of multiple sites: Secondary | ICD-10-CM | POA: Diagnosis not present

## 2018-12-17 DIAGNOSIS — D696 Thrombocytopenia, unspecified: Secondary | ICD-10-CM | POA: Insufficient documentation

## 2018-12-17 DIAGNOSIS — E876 Hypokalemia: Secondary | ICD-10-CM | POA: Insufficient documentation

## 2018-12-17 LAB — CBC WITH DIFFERENTIAL/PLATELET
Abs Immature Granulocytes: 0.03 10*3/uL (ref 0.00–0.07)
Basophils Absolute: 0 10*3/uL (ref 0.0–0.1)
Basophils Relative: 0 %
Eosinophils Absolute: 0.1 10*3/uL (ref 0.0–0.5)
Eosinophils Relative: 1 %
HCT: 34.2 % — ABNORMAL LOW (ref 36.0–46.0)
Hemoglobin: 10.4 g/dL — ABNORMAL LOW (ref 12.0–15.0)
Immature Granulocytes: 0 %
Lymphocytes Relative: 68 %
Lymphs Abs: 8.3 10*3/uL — ABNORMAL HIGH (ref 0.7–4.0)
MCH: 26.7 pg (ref 26.0–34.0)
MCHC: 30.4 g/dL (ref 30.0–36.0)
MCV: 87.7 fL (ref 80.0–100.0)
Monocytes Absolute: 1.3 10*3/uL — ABNORMAL HIGH (ref 0.1–1.0)
Monocytes Relative: 11 %
Neutro Abs: 2.4 10*3/uL (ref 1.7–7.7)
Neutrophils Relative %: 20 %
Platelets: 143 10*3/uL — ABNORMAL LOW (ref 150–400)
RBC: 3.9 MIL/uL (ref 3.87–5.11)
RDW: 16.9 % — ABNORMAL HIGH (ref 11.5–15.5)
WBC: 12.1 10*3/uL — ABNORMAL HIGH (ref 4.0–10.5)
nRBC: 0 % (ref 0.0–0.2)

## 2018-12-17 LAB — VITAMIN B12: Vitamin B-12: 705 pg/mL (ref 180–914)

## 2018-12-17 LAB — FERRITIN: Ferritin: 230 ng/mL (ref 11–307)

## 2018-12-17 LAB — CMP (CANCER CENTER ONLY)
ALT: 8 U/L (ref 0–44)
AST: 14 U/L — ABNORMAL LOW (ref 15–41)
Albumin: 3.3 g/dL — ABNORMAL LOW (ref 3.5–5.0)
Alkaline Phosphatase: 57 U/L (ref 38–126)
Anion gap: 11 (ref 5–15)
BUN: 13 mg/dL (ref 8–23)
CO2: 24 mmol/L (ref 22–32)
Calcium: 10.3 mg/dL (ref 8.9–10.3)
Chloride: 107 mmol/L (ref 98–111)
Creatinine: 1.04 mg/dL — ABNORMAL HIGH (ref 0.44–1.00)
GFR, Est AFR Am: 56 mL/min — ABNORMAL LOW (ref >60)
GFR, Estimated: 48 mL/min — ABNORMAL LOW (ref >60)
Glucose, Bld: 85 mg/dL (ref 70–99)
Potassium: 3.9 mmol/L (ref 3.5–5.1)
Sodium: 142 mmol/L (ref 135–145)
Total Bilirubin: 0.6 mg/dL (ref 0.3–1.2)
Total Protein: 8.8 g/dL — ABNORMAL HIGH (ref 6.5–8.1)

## 2018-12-17 LAB — LACTATE DEHYDROGENASE: LDH: 179 U/L (ref 98–192)

## 2018-12-17 LAB — IRON AND TIBC
Iron: 22 ug/dL — ABNORMAL LOW (ref 41–142)
Saturation Ratios: 9 % — ABNORMAL LOW (ref 21–57)
TIBC: 246 ug/dL (ref 236–444)
UIBC: 224 ug/dL (ref 120–384)

## 2018-12-17 NOTE — Telephone Encounter (Signed)
Scheduled appt per 10/22 los.  Sent a staff message and a calendar will be mailed out.

## 2018-12-21 DIAGNOSIS — E785 Hyperlipidemia, unspecified: Secondary | ICD-10-CM | POA: Diagnosis not present

## 2018-12-21 DIAGNOSIS — I480 Paroxysmal atrial fibrillation: Secondary | ICD-10-CM | POA: Diagnosis not present

## 2018-12-21 DIAGNOSIS — I10 Essential (primary) hypertension: Secondary | ICD-10-CM | POA: Diagnosis not present

## 2018-12-21 DIAGNOSIS — D500 Iron deficiency anemia secondary to blood loss (chronic): Secondary | ICD-10-CM | POA: Diagnosis not present

## 2018-12-21 DIAGNOSIS — M179 Osteoarthritis of knee, unspecified: Secondary | ICD-10-CM | POA: Diagnosis not present

## 2018-12-21 DIAGNOSIS — R944 Abnormal results of kidney function studies: Secondary | ICD-10-CM | POA: Diagnosis not present

## 2018-12-23 DIAGNOSIS — Z86711 Personal history of pulmonary embolism: Secondary | ICD-10-CM | POA: Diagnosis not present

## 2018-12-23 DIAGNOSIS — Z7901 Long term (current) use of anticoagulants: Secondary | ICD-10-CM | POA: Diagnosis not present

## 2018-12-23 DIAGNOSIS — I480 Paroxysmal atrial fibrillation: Secondary | ICD-10-CM | POA: Diagnosis not present

## 2018-12-23 DIAGNOSIS — I10 Essential (primary) hypertension: Secondary | ICD-10-CM | POA: Diagnosis not present

## 2019-02-01 DIAGNOSIS — N390 Urinary tract infection, site not specified: Secondary | ICD-10-CM | POA: Diagnosis not present

## 2019-02-01 DIAGNOSIS — R319 Hematuria, unspecified: Secondary | ICD-10-CM | POA: Diagnosis not present

## 2019-02-01 DIAGNOSIS — R358 Other polyuria: Secondary | ICD-10-CM | POA: Diagnosis not present

## 2019-02-22 DIAGNOSIS — I10 Essential (primary) hypertension: Secondary | ICD-10-CM | POA: Diagnosis not present

## 2019-02-22 DIAGNOSIS — E785 Hyperlipidemia, unspecified: Secondary | ICD-10-CM | POA: Diagnosis not present

## 2019-02-22 DIAGNOSIS — I480 Paroxysmal atrial fibrillation: Secondary | ICD-10-CM | POA: Diagnosis not present

## 2019-02-22 DIAGNOSIS — D500 Iron deficiency anemia secondary to blood loss (chronic): Secondary | ICD-10-CM | POA: Diagnosis not present

## 2019-02-22 DIAGNOSIS — R944 Abnormal results of kidney function studies: Secondary | ICD-10-CM | POA: Diagnosis not present

## 2019-02-22 DIAGNOSIS — M179 Osteoarthritis of knee, unspecified: Secondary | ICD-10-CM | POA: Diagnosis not present

## 2019-03-18 ENCOUNTER — Inpatient Hospital Stay: Payer: Medicare HMO | Attending: Hematology

## 2019-03-18 ENCOUNTER — Other Ambulatory Visit: Payer: Self-pay

## 2019-03-18 ENCOUNTER — Inpatient Hospital Stay: Payer: Medicare HMO | Admitting: Hematology

## 2019-03-18 VITALS — BP 184/90 | HR 98 | Temp 98.3°F | Resp 17 | Ht 66.0 in | Wt 106.7 lb

## 2019-03-18 DIAGNOSIS — R634 Abnormal weight loss: Secondary | ICD-10-CM | POA: Insufficient documentation

## 2019-03-18 DIAGNOSIS — D696 Thrombocytopenia, unspecified: Secondary | ICD-10-CM | POA: Insufficient documentation

## 2019-03-18 DIAGNOSIS — Z90710 Acquired absence of both cervix and uterus: Secondary | ICD-10-CM | POA: Diagnosis not present

## 2019-03-18 DIAGNOSIS — I10 Essential (primary) hypertension: Secondary | ICD-10-CM | POA: Insufficient documentation

## 2019-03-18 DIAGNOSIS — C8308 Small cell B-cell lymphoma, lymph nodes of multiple sites: Secondary | ICD-10-CM

## 2019-03-18 DIAGNOSIS — Z87891 Personal history of nicotine dependence: Secondary | ICD-10-CM | POA: Insufficient documentation

## 2019-03-18 DIAGNOSIS — C8518 Unspecified B-cell lymphoma, lymph nodes of multiple sites: Secondary | ICD-10-CM | POA: Diagnosis not present

## 2019-03-18 DIAGNOSIS — D500 Iron deficiency anemia secondary to blood loss (chronic): Secondary | ICD-10-CM

## 2019-03-18 DIAGNOSIS — D649 Anemia, unspecified: Secondary | ICD-10-CM

## 2019-03-18 DIAGNOSIS — D509 Iron deficiency anemia, unspecified: Secondary | ICD-10-CM | POA: Insufficient documentation

## 2019-03-18 DIAGNOSIS — E538 Deficiency of other specified B group vitamins: Secondary | ICD-10-CM | POA: Insufficient documentation

## 2019-03-18 LAB — CMP (CANCER CENTER ONLY)
ALT: 7 U/L (ref 0–44)
AST: 15 U/L (ref 15–41)
Albumin: 3.7 g/dL (ref 3.5–5.0)
Alkaline Phosphatase: 57 U/L (ref 38–126)
Anion gap: 11 (ref 5–15)
BUN: 13 mg/dL (ref 8–23)
CO2: 23 mmol/L (ref 22–32)
Calcium: 10.2 mg/dL (ref 8.9–10.3)
Chloride: 109 mmol/L (ref 98–111)
Creatinine: 0.93 mg/dL (ref 0.44–1.00)
GFR, Est AFR Am: >60 mL/min (ref >60)
GFR, Estimated: 55 mL/min — ABNORMAL LOW (ref >60)
Glucose, Bld: 80 mg/dL (ref 70–99)
Potassium: 3.4 mmol/L — ABNORMAL LOW (ref 3.5–5.1)
Sodium: 143 mmol/L (ref 135–145)
Total Bilirubin: 0.6 mg/dL (ref 0.3–1.2)
Total Protein: 9.6 g/dL — ABNORMAL HIGH (ref 6.5–8.1)

## 2019-03-18 LAB — CBC WITH DIFFERENTIAL/PLATELET
Abs Immature Granulocytes: 0.01 10*3/uL (ref 0.00–0.07)
Basophils Absolute: 0 10*3/uL (ref 0.0–0.1)
Basophils Relative: 1 %
Eosinophils Absolute: 0.1 10*3/uL (ref 0.0–0.5)
Eosinophils Relative: 1 %
HCT: 35.6 % — ABNORMAL LOW (ref 36.0–46.0)
Hemoglobin: 11 g/dL — ABNORMAL LOW (ref 12.0–15.0)
Immature Granulocytes: 0 %
Lymphocytes Relative: 64 %
Lymphs Abs: 5.5 10*3/uL — ABNORMAL HIGH (ref 0.7–4.0)
MCH: 26.9 pg (ref 26.0–34.0)
MCHC: 30.9 g/dL (ref 30.0–36.0)
MCV: 87 fL (ref 80.0–100.0)
Monocytes Absolute: 0.8 10*3/uL (ref 0.1–1.0)
Monocytes Relative: 9 %
Neutro Abs: 2.2 10*3/uL (ref 1.7–7.7)
Neutrophils Relative %: 25 %
Platelets: 128 10*3/uL — ABNORMAL LOW (ref 150–400)
RBC: 4.09 MIL/uL (ref 3.87–5.11)
RDW: 16.2 % — ABNORMAL HIGH (ref 11.5–15.5)
WBC: 8.5 10*3/uL (ref 4.0–10.5)
nRBC: 0 % (ref 0.0–0.2)

## 2019-03-18 LAB — LACTATE DEHYDROGENASE: LDH: 190 U/L (ref 98–192)

## 2019-03-18 NOTE — Progress Notes (Signed)
HEMATOLOGY/ONCOLOGY CLINIC NOTE  Date of Service: 03/18/19   Patient Care Team: Carol Ada, MD as PCP - General (Family Medicine)  CHIEF COMPLAINTS/PURPOSE OF CONSULTATION:   F/u for continue mx of low grade NHL Iron def Anemia  HISTORY OF PRESENTING ILLNESS:   Sara Butler is a wonderful 86 y.o. female who has been referred to [place] by Dr .Carol Ada, MD  for evaluation and management of increasing lymphocytosis, anemia and thrombocytopenia.  Patient has a history of hypertension, dyslipidemia, paroxysmal atrial fibrillation on Coumadin, previous history of pulmonary embolism in 2013, history of iron deficiency due to chronic GI losses on oral iron therapy, GERD.  Patient had recent labs with her primary care physician on 05/01/2016 which showed total WBC count of 10.2k with 6.6k lymphocytes, mild anemia with hemoglobin of 11.4 and somewhat microcytic in dialysis with an MCV of 80 and an RDW of 19. She was also noted to have mild thrombocytopenia with a platelet count of 143k.  Patient had workup including getting TSH which is within normal limits. Noted to have significant B12 deficiency with a B12 level of 116. Has been started on oral B12 5000 g daily by her primary care physician. Recommended considering sublingual B12 replacement. Would need to rule out pernicious anemia.   Patient notes that she has been on oral iron replacement on and off.  She reports issues with what is thought to be irritable bowel syndrome for which she is on Myrbetriq.  Patient notes that she had some abdominal discomfort and had a CT of the abdomen and pelvis on 01/01/2016 which showed Prominent bilateral inguinal as well as gastrohepatic lymphadenopathy. These are new from the prior exam of 2014 in the gastrohepatic changes appear to be new from the recent exam from 06/20/2015.  Patient notes no fevers no chills no night sweats no significant unexpected weight loss. She denies any overt GI  bleeding. No other issues with overt nosebleeds gum bleeds or other sources of bleeding  INTERVAL HISTORY:     Sara Butler is her for f/u on her lymphoproliferative disorder -low grade NHL NOS. The patient's last visit with [place] was on 12/17/2018. The pt reports that she is doing well overall. She is accompanied by her granddaughter.   The pt reports that she is feeling low today. She feels that she should be able to do more for herself without depending on her family members.  She has not had a visit with her PCP in a while.  She has not been taking her blood pressure medication regularly. She sometimes checks her BP at home at it is normal. She has not taken her BP medication this morning.    She sometimes forgets to take her iron pills.   Lab results today (03/18/19) of CBC w/diff and CMP is as follows: all values are WNL except for Hemoglobin at 11.0, HCT at 35.6, RDW at 16.2, Platelets at 128, Potassium at 3.4, total Protein at 9.6, GFR Est Non Af Am at 55, LDH - wnl @ 190  On review of systems, pt reports headache, depression and denies new lumps or bumps, abdominal pain,  any other symptoms.   MEDICAL HISTORY:  Past Medical History:  Diagnosis Date  . Atrial fibrillation ([place])   . Bleeding from anus   . Cervical cancer ([place])   . Diverticulitis of colon   . GERD (gastroesophageal reflux disease)   . Gout   . Hyperlipidemia   . Hypertension   .  Pulmonary embolism ([facility])   Iron deficiency anemia due to chronic blood loss Paroxysmal atrial fibrillation on Coumadin Diverticulosis of the large and this time Vitamin D deficiency Previous history of pulmonary embolism in 2013 Overactive bladder Osteoarthritis of the knee Chronic back pain History of previous GI bleed in 2011 (while on pradaxa)  SURGICAL HISTORY: Past Surgical History:  Procedure Laterality Date  . ABDOMINAL HYSTERECTOMY    . BACK SURGERY      SOCIAL HISTORY: Social History   Socioeconomic History  .  Marital status: Widowed    Spouse name: Not on file  . Number of children: 4  . Years of education: 52  . Highest education level: Not on file  Occupational History  . Occupation: Retired Museum/gallery curator at [employer]  . Smoking status: Former Smoker    Packs/day: 0.10    Years: 58.00    Pack years: 5.80    Types: Cigarettes  . Smokeless tobacco: Never Used  Substance and Sexual Activity  . Alcohol use: No  . Drug use: No  . Sexual activity: Never  Other Topics Concern  . Not on file  Social History Narrative   Widowed. Lives alone.  Normally independent of ADLs and ambulation.   Social Determinants of Health   Financial Resource Strain:   . Difficulty of Paying Living Expenses: Not on file  Food Insecurity:   . Worried About Charity fundraiser in the Last Year: Not on file  . Ran Out of Food in the Last Year: Not on file  Transportation Needs:   . Lack of Transportation (Medical): Not on file  . Lack of Transportation (Non-Medical): Not on file  Physical Activity:   . Days of Exercise per Week: Not on file  . Minutes of Exercise per Session: Not on file  Stress:   . Feeling of Stress : Not on file  Social Connections:   . Frequency of Communication with Friends and Family: Not on file  . Frequency of Social Gatherings with Friends and Family: Not on file  . Attends Religious Services: Not on file  . Active Member of Clubs or Organizations: Not on file  . Attends Archivist Meetings: Not on file  . Marital Status: Not on file  Intimate Partner Violence:   . Fear of Current or Ex-Partner: Not on file  . Emotionally Abused: Not on file  . Physically Abused: Not on file  . Sexually Abused: Not on file    FAMILY HISTORY: Family History  Problem Relation Age of Onset  . Heart failure Mother   . Diabetes Brother   . Cancer Neg Hx     ALLERGIES:  has No Known Allergies.  MEDICATIONS:  Current Outpatient Medications  Medication Sig Dispense  Refill  . acetaminophen (TYLENOL) 500 MG tablet Take 500 mg by mouth every 6 (six) hours as needed for headache.    Marland Kitchen atenolol (TENORMIN) 50 MG tablet Take 50 mg daily by mouth.     . B-12, Methylcobalamin, 1000 MCG SUBL Place 1 tablet under the tongue daily. 60 tablet 1  . cephALEXin (KEFLEX) 250 MG capsule Take 1 capsule (250 mg total) by mouth 3 (three) times daily. 21 capsule 0  . Cyanocobalamin (B-12) 1000 MCG SUBL Place 1,000 mcg under the tongue daily. 30 each 3  . dexamethasone (DECADRON) 2 MG tablet TAKE 1 TABLET BY MOUTH DAILY WITH BREAKFAST 30 tablet 0  . diclofenac sodium (VOLTAREN) 1 % GEL Apply 2 g  topically daily as needed (pain).     Marland Kitchen HYDROCORTISONE, TOPICAL, 2 % LOTN Apply 1 application topically 2 (two) times daily. To area of eczematoid rash and pruritus on upper extremities 30 mL 0  . iron polysaccharides (NIFEREX) 150 MG capsule Take 1 capsule (150 mg total) by mouth daily. 30 capsule 2  . phenazopyridine (PYRIDIUM) 100 MG tablet Take 1 tablet (100 mg total) by mouth 3 (three) times daily as needed for pain. 15 tablet 0  . potassium chloride SA (K-DUR,KLOR-CON) 20 MEQ tablet Take 1 tablet (20 mEq total) by mouth 2 (two) times daily for 10 days. (Patient not taking: Reported on 10/23/2017) 20 tablet 0  . warfarin (COUMADIN) 5 MG tablet Take 5-7.5 mg by mouth daily. Take 5 mg on Monday and Tuesday, all other days take 7.5 mg     No current facility-administered medications for this visit.    REVIEW OF SYSTEMS:   A 10+ POINT REVIEW OF SYSTEMS WAS OBTAINED including neurology, dermatology, psychiatry, cardiac, respiratory, lymph, extremities, GI, GU, Musculoskeletal, constitutional, breasts, reproductive, HEENT.  All pertinent positives are noted in the HPI.  All others are negative.         PHYSICAL EXAMINATION:  ECOG FS:2 - Symptomatic, <50% confined to bed  Vitals:   03/18/19 1058  BP: (!) 184/90  Pulse: 98  Resp: 17  Temp: 98.3 F (36.8 C)  SpO2: 95%   Wt  Readings from Last 3 Encounters:  03/18/19 106 lb 11.2 oz (48.4 kg)  12/17/18 105 lb 1.6 oz (47.7 kg)  10/05/18 110 lb (49.9 kg)   Body mass index is 17.22 kg/m.    GENERAL:alert, in no acute distress and comfortable SKIN: no acute rashes, no significant lesions EYES: conjunctiva are pink and non-injected, sclera anicteric OROPHARYNX: MMM, no exudates, no oropharyngeal erythema or ulceration NECK: supple, no JVD LYMPH:  no palpable lymphadenopathy in the cervical, axillary or inguinal regions LUNGS: clear to auscultation b/l with normal respiratory effort HEART: regular rate & rhythm ABDOMEN:  normoactive bowel sounds , non tender, not distended. Extremity: no pedal edema PSYCH: alert & oriented x 3 with fluent speech NEURO: no focal motor/sensory deficits     LABORATORY DATA:  I have reviewed the data as listed  CBC Latest Ref Rng & Units 03/18/2019 12/17/2018 10/05/2018  WBC 4.0 - 10.5 K/uL 8.5 12.1(H) 15.1(H)  Hemoglobin 12.0 - 15.0 g/dL 11.0(L) 10.4(L) 9.5(L)  Hematocrit 36.0 - 46.0 % 35.6(L) 34.2(L) 31.5(L)  Platelets 150 - 400 K/uL 128(L) 143(L) 170    CBC    Component Value Date/Time   WBC 8.5 03/18/2019 1041   RBC 4.09 03/18/2019 1041   HGB 11.0 (L) 03/18/2019 1041   HGB 9.5 (L) 05/01/2017 0921   HGB 10.4 (L) 12/02/2016 0900   HCT 35.6 (L) 03/18/2019 1041   HCT 32.4 (L) 12/02/2016 0900   PLT 128 (L) 03/18/2019 1041   PLT 130 (L) 05/01/2017 0921   PLT 131 (L) 12/02/2016 0900   MCV 87.0 03/18/2019 1041   MCV 78.8 (L) 12/02/2016 0900   MCH 26.9 03/18/2019 1041   MCHC 30.9 03/18/2019 1041   RDW 16.2 (H) 03/18/2019 1041   RDW 19.1 (H) 12/02/2016 0900   LYMPHSABS PENDING 03/18/2019 1041   LYMPHSABS 10.6 (H) 12/02/2016 0900   MONOABS PENDING 03/18/2019 1041   MONOABS 1.0 (H) 12/02/2016 0900   EOSABS PENDING 03/18/2019 1041   EOSABS 0.2 12/02/2016 0900   BASOSABS PENDING 03/18/2019 1041   BASOSABS 0.0 12/02/2016 0900  CMP Latest Ref Rng & Units 12/17/2018  10/05/2018 09/15/2018  Glucose 70 - 99 mg/dL 85 101(H) 90  BUN 8 - 23 mg/dL 13 18 9   Creatinine 0.44 - 1.00 mg/dL 1.04(H) 1.02(H) 0.98  Sodium 135 - 145 mmol/L 142 139 141  Potassium 3.5 - 5.1 mmol/L 3.9 2.9(L) 4.0  Chloride 98 - 111 mmol/L 107 106 107  CO2 22 - 32 mmol/L 24 23 25   Calcium 8.9 - 10.3 mg/dL 10.3 10.1 10.4(H)  Total Protein 6.5 - 8.1 g/dL 8.8(H) - 9.0(H)  Total Bilirubin 0.3 - 1.2 mg/dL 0.6 - 0.6  Alkaline Phos 38 - 126 U/L 57 - 68  AST 15 - 41 U/L 14(L) - 18  ALT 0 - 44 U/L 8 - 10   Lab Results  Component Value Date   IRON 22 (L) 12/17/2018   TIBC 246 12/17/2018   IRONPCTSAT 9 (L) 12/17/2018   (Iron and TIBC)    Lab Results  Component Value Date   FERRITIN 230 12/17/2018        RADIOGRAPHIC STUDIES: I have personally reviewed the radiological images as listed and agreed with the findings in the report.  PET/CT scan 08/18/2016 IMPRESSION: 1. Scattered primarily mild adenopathy most notable in the bilateral inguinal regions, but also with involvement of the left internal mammary chain, left axilla, retroperitoneum, gastrohepatic ligament, and of a pericardial lymph node. Questionable involvement of a small right station 2 lymph node and at the right axilla. Much of this is Deauville 4 disease, with some Deauville 3 and of L2 disease as noted above. 2. Faintly accentuated activity in the spleen without focal activity or overt splenomegaly. This may merit surveillance.  3. There is some Deauville 3 level activity associated with mild soft tissue prominence of the labia majora. This could be simply incidental. 4. Other imaging findings of potential clinical significance: Aortic Atherosclerosis (ICD10-I70.0). Coronary atherosclerosis. Chronic left occipital lobe encephalomalacia. Hepatic cysts and potential scarring in the right hepatic lobe. Pancreas divisum. Chronic degenerative anterolisthesis at L4-5. Bony demineralization.   Electronically Signed   By:  Van Clines M.D.   On: 08/18/2016 13:09   ASSESSMENT & PLAN:   86 y.o. female with multiple medical comorbidities with  #1 Low grade Non Hodgkins B cell lymphoma NOS  Flow cytometry suggestive of Cd20+ CD5-CD 10 neg Low grade Non Hodgkins lymphoma. Concern for possible CD5 neg chronic lymphocytic leukemia versus low grade non-Hodgkin's lymphoma NOS  Patient did have some borderline abdominal and inguinal lymphadenopathy on her CT abdomen pelvis in November 2017.  PET/CT 6/25 showed Scattered primarily mild adenopathy most notable in the bilateral inguinal regions, but also with involvement of the left internal mammary chain, left axilla, retroperitoneum, gastrohepatic ligament, and of a pericardial lymph node. Questionable involvement of a small right station 2 lymph node and at the right axilla. Much of this is Deauville 4 disease, with some Deauville 3 and of L2 disease as noted above. 2. Faintly accentuated activity in the spleen without focal activity or overt splenomegaly.  CLL FISH Prognostic panel -- did not show any mutation typical for CLL. Less likely CD5 neg CLL US guided Bx of inguinal LN was done- however only limited lymphoid tissue was obtained and the biopsy is nondiagnostic. Patient had previously decided to hold off  On an excisional lymph node biopsy  #2 Anemia  - microcytic anemia with some element to iron deficiency.  Iron /ferritin levels much improved after IV iron . Lab Results  Component Value  Date   IRON 22 (L) 12/17/2018   TIBC 246 12/17/2018   IRONPCTSAT 9 (L) 12/17/2018   (Iron and TIBC)  Lab Results  Component Value Date   FERRITIN 230 12/17/2018    #3 B12 deficiency. B12 levels are coming up from 116 now up to 295 to 722 with oral B12 replacement. No evidence of pernicious anemia based on neg antibody testing. B12 - 705  #4 Mild thrombocytopenia-  could be related to her NHL -Will continue to monitor.  #5 Weight Loss Wt Readings from  Last 3 Encounters:  03/18/19 106 lb 11.2 oz (48.4 kg)  12/17/18 105 lb 1.6 oz (47.7 kg)  10/05/18 110 lb (49.9 kg)   PLAN: -Discussed pt labwork today, 03/18/19; all values are WNL except for Hemoglobin at 11.0, HCT at 35.6, RDW at 16.2, Platelets at 128, Potassium at 3.4, total Protein at 9.6, GFR Est Non Af Am at 55, PENDING LDH. -Discussed 03/18/19 Hemoglobin at 11.0 -Discussed 03/18/19 WBC at 8.5 -Discussed 03/18/19 Potassium at 3.4 -Discussed 03/18/19 Platelets at 128, slighlty low but not an active concern -Discussed LDH at 190 -Discussed current medication. Recommended that she follow up with her PCP to insure that she is taking her medication correctly  -Will recheck BP after exam  -Reviewed her medical diagnoses of low grade lymphoma and when to treat. Advised that the disease is not significantly affecting blood counts or affecting lymphs and that treatment is not recommended. Possible rx side effects could make her feel worse.  - Discussed COVID-19 vaccine. Recommended that she receive the vaccine and advised her on how to register. -Recommended receiving help from family members to remember to take medications properly.  -Recommended following up with PCP for memory concerns.  FOLLOW UP: RTC with Dr Irene Limbo with labs in 3 months   The total time spent in the appt was 30 minutes and more than 50% was on counseling and direct patient cares.  All of the patient's questions were answered with apparent satisfaction. The patient knows to call the clinic with any problems, questions or concerns.    Sullivan Lone MD [place] AAHIVMS [facility] Hematology/Oncology Physician [facility]  (Office):       [phone] (Work cell):  [phone] (Fax):           [phone]  I, Scot Dock, am acting as a scribe for Dr. Sullivan Lone.   .I have reviewed the above documentation for accuracy and completeness, and I agree with the above. Brunetta Genera MD

## 2019-03-21 ENCOUNTER — Telehealth: Payer: Self-pay | Admitting: Hematology

## 2019-03-21 NOTE — Telephone Encounter (Signed)
Scheduled per 01/22 los, patient has been called and notified.  

## 2019-03-24 DIAGNOSIS — N3941 Urge incontinence: Secondary | ICD-10-CM | POA: Diagnosis not present

## 2019-03-24 DIAGNOSIS — N390 Urinary tract infection, site not specified: Secondary | ICD-10-CM | POA: Diagnosis not present

## 2019-03-24 DIAGNOSIS — N3281 Overactive bladder: Secondary | ICD-10-CM | POA: Diagnosis not present

## 2019-03-24 DIAGNOSIS — N952 Postmenopausal atrophic vaginitis: Secondary | ICD-10-CM | POA: Diagnosis not present

## 2019-03-25 DIAGNOSIS — E785 Hyperlipidemia, unspecified: Secondary | ICD-10-CM | POA: Diagnosis not present

## 2019-03-25 DIAGNOSIS — I10 Essential (primary) hypertension: Secondary | ICD-10-CM | POA: Diagnosis not present

## 2019-03-25 DIAGNOSIS — R944 Abnormal results of kidney function studies: Secondary | ICD-10-CM | POA: Diagnosis not present

## 2019-03-25 DIAGNOSIS — M179 Osteoarthritis of knee, unspecified: Secondary | ICD-10-CM | POA: Diagnosis not present

## 2019-03-25 DIAGNOSIS — D500 Iron deficiency anemia secondary to blood loss (chronic): Secondary | ICD-10-CM | POA: Diagnosis not present

## 2019-03-25 DIAGNOSIS — I480 Paroxysmal atrial fibrillation: Secondary | ICD-10-CM | POA: Diagnosis not present

## 2019-06-16 NOTE — Progress Notes (Signed)
HEMATOLOGY/ONCOLOGY CLINIC NOTE  Date of Service: 06/17/19   Patient Care Team: Carol Ada, MD as PCP - General (Family Medicine)  CHIEF COMPLAINTS/PURPOSE OF CONSULTATION:   F/u for continue mx of low grade NHL Iron def Anemia  HISTORY OF PRESENTING ILLNESS:   Sara Butler is a wonderful 86 y.o. female who has been referred to [place] by Dr .Carol Ada, MD  for evaluation and management of increasing lymphocytosis, anemia and thrombocytopenia.  Patient has a history of hypertension, dyslipidemia, paroxysmal atrial fibrillation on Coumadin, previous history of pulmonary embolism in 2013, history of iron deficiency due to chronic GI losses on oral iron therapy, GERD.  Patient had recent labs with her primary care physician on 05/01/2016 which showed total WBC count of 10.2k with 6.6k lymphocytes, mild anemia with hemoglobin of 11.4 and somewhat microcytic in dialysis with an MCV of 80 and an RDW of 19. She was also noted to have mild thrombocytopenia with a platelet count of 143k.  Patient had workup including getting TSH which is within normal limits. Noted to have significant B12 deficiency with a B12 level of 116. Has been started on oral B12 5000 g daily by her primary care physician. Recommended considering sublingual B12 replacement. Would need to rule out pernicious anemia.   Patient notes that she has been on oral iron replacement on and off.  She reports issues with what is thought to be irritable bowel syndrome for which she is on Myrbetriq.  Patient notes that she had some abdominal discomfort and had a CT of the abdomen and pelvis on 01/01/2016 which showed Prominent bilateral inguinal as well as gastrohepatic lymphadenopathy. These are new from the prior exam of 2014 in the gastrohepatic changes appear to be new from the recent exam from 06/20/2015.  Patient notes no fevers no chills no night sweats no significant unexpected weight loss. She denies any overt GI  bleeding. No other issues with overt nosebleeds gum bleeds or other sources of bleeding  INTERVAL HISTORY:     Sara Butler is her for f/u on her lymphoproliferative disorder -low grade NHL NOS. Pt is joined by daughter. The patient's last visit with [place] was on 03/18/19. The pt reports that she is doing well overall.  The pt reports she is good. She has been itching and got a rash on her arms and has been using hydrocortisone lotion to help. Pt has gotten both doses of the COVID19 vaccine. She can feel her lymph nodes in the inguinal region. Pt has been switch to Xarelto.   Lab results today (06/17/19) of CBC w/diff and CMP is as follows: all values are WNL except for Hemoglobin at 11.1, HCT at 35.8, Platelets at 119K, Lymph's Abs at 4.7K, Monocytes Absolute at 1.2KSodium at 146, Chloride at 112, BUN at 6, Albumin at 3.2, GFR, Est Non Af Am at 53 06/17/19 of Ferritin at 177 06/17/19 of Iron and TIBC is as follows: all values are WNL except for Iron at 39, TIBC at 228, Saturation Ratios at 17  On review of systems, pt reports fatigue, rash, itching and denies fever, chills, nigh sweats, weight changes, new lumps/bumps, abdominal pain, changes in breathing, changes in bowl habits, pedal edema and any other symptoms.  MEDICAL HISTORY:  Past Medical History:  Diagnosis Date  . Atrial fibrillation ([place])   . Bleeding from anus   . Cervical cancer ([place])   . Diverticulitis of colon   . GERD (gastroesophageal reflux disease)   .  Gout   . Hyperlipidemia   . Hypertension   . Pulmonary embolism ([facility])   Iron deficiency anemia due to chronic blood loss Paroxysmal atrial fibrillation on Coumadin Diverticulosis of the large and this time Vitamin D deficiency Previous history of pulmonary embolism in 2013 Overactive bladder Osteoarthritis of the knee Chronic back pain History of previous GI bleed in 2011 (while on pradaxa)  SURGICAL HISTORY: Past Surgical History:  Procedure Laterality Date  .  ABDOMINAL HYSTERECTOMY    . BACK SURGERY      SOCIAL HISTORY: Social History   Socioeconomic History  . Marital status: Widowed    Spouse name: Not on file  . Number of children: 4  . Years of education: 68  . Highest education level: Not on file  Occupational History  . Occupation: Retired Museum/gallery curator at [employer]  . Smoking status: Former Smoker    Packs/day: 0.10    Years: 58.00    Pack years: 5.80    Types: Cigarettes  . Smokeless tobacco: Never Used  Substance and Sexual Activity  . Alcohol use: No  . Drug use: No  . Sexual activity: Never  Other Topics Concern  . Not on file  Social History Narrative   Widowed. Lives alone.  Normally independent of ADLs and ambulation.   Social Determinants of Health   Financial Resource Strain:   . Difficulty of Paying Living Expenses:   Food Insecurity:   . Worried About Charity fundraiser in the Last Year:   . Arboriculturist in the Last Year:   Transportation Needs:   . Film/video editor (Medical):   Marland Kitchen Lack of Transportation (Non-Medical):   Physical Activity:   . Days of Exercise per Week:   . Minutes of Exercise per Session:   Stress:   . Feeling of Stress :   Social Connections:   . Frequency of Communication with Friends and Family:   . Frequency of Social Gatherings with Friends and Family:   . Attends Religious Services:   . Active Member of Clubs or Organizations:   . Attends Archivist Meetings:   Marland Kitchen Marital Status:   Intimate Partner Violence:   . Fear of Current or Ex-Partner:   . Emotionally Abused:   Marland Kitchen Physically Abused:   . Sexually Abused:     FAMILY HISTORY: Family History  Problem Relation Age of Onset  . Heart failure Mother   . Diabetes Brother   . Cancer Neg Hx     ALLERGIES:  has No Known Allergies.  MEDICATIONS:  Current Outpatient Medications  Medication Sig Dispense Refill  . acetaminophen (TYLENOL) 500 MG tablet Take 500 mg by mouth every 6 (six) hours  as needed for headache.    Marland Kitchen amLODipine (NORVASC) 2.5 MG tablet Take 2.5 mg by mouth daily.    Marland Kitchen atenolol (TENORMIN) 50 MG tablet Take 50 mg daily by mouth.     . B-12, Methylcobalamin, 1000 MCG SUBL Place 1 tablet under the tongue daily. 60 tablet 1  . cephALEXin (KEFLEX) 250 MG capsule Take 1 capsule (250 mg total) by mouth 3 (three) times daily. 21 capsule 0  . Cyanocobalamin (B-12) 1000 MCG SUBL Place 1,000 mcg under the tongue daily. 30 each 3  . dexamethasone (DECADRON) 2 MG tablet TAKE 1 TABLET BY MOUTH DAILY WITH BREAKFAST 30 tablet 0  . diclofenac sodium (VOLTAREN) 1 % GEL Apply 2 g topically daily as needed (pain).     Marland Kitchen  HYDROCORTISONE, TOPICAL, 2 % LOTN Apply 1 application topically 2 (two) times daily. To area of eczematoid rash and pruritus on upper extremities 30 mL 0  . iron polysaccharides (NIFEREX) 150 MG capsule Take 1 capsule (150 mg total) by mouth daily. 30 capsule 2  . phenazopyridine (PYRIDIUM) 100 MG tablet Take 1 tablet (100 mg total) by mouth 3 (three) times daily as needed for pain. 15 tablet 0  . rivaroxaban (XARELTO) 10 MG TABS tablet Take 10 mg by mouth daily.    . potassium chloride SA (K-DUR,KLOR-CON) 20 MEQ tablet Take 1 tablet (20 mEq total) by mouth 2 (two) times daily for 10 days. (Patient not taking: Reported on 10/23/2017) 20 tablet 0  . warfarin (COUMADIN) 5 MG tablet Take 5-7.5 mg by mouth daily. Take 5 mg on Monday and Tuesday, all other days take 7.5 mg     No current facility-administered medications for this visit.    REVIEW OF SYSTEMS:   A 10+ POINT REVIEW OF SYSTEMS WAS OBTAINED including neurology, dermatology, psychiatry, cardiac, respiratory, lymph, extremities, GI, GU, Musculoskeletal, constitutional, breasts, reproductive, HEENT.  All pertinent positives are noted in the HPI.  All others are negative.   PHYSICAL EXAMINATION:  ECOG FS:2 - Symptomatic, <50% confined to bed  Vitals:   06/17/19 1102  BP: 137/88  Pulse: 74  Resp: 18  Temp:  98.2 F (36.8 C)  SpO2: 100%   Wt Readings from Last 3 Encounters:  06/17/19 110 lb 14.4 oz (50.3 kg)  03/18/19 106 lb 11.2 oz (48.4 kg)  12/17/18 105 lb 1.6 oz (47.7 kg)   Body mass index is 17.9 kg/m.    Exam given is chair  GENERAL:alert, in no acute distress and comfortable SKIN: no acute rashes, no significant lesions EYES: conjunctiva are pink and non-injected, sclera anicteric OROPHARYNX: MMM, no exudates, no oropharyngeal erythema or ulceration NECK: supple, no JVD LYMPH:  no palpable lymphadenopathy in the cervical, axillary or inguinal regions LUNGS: clear to auscultation b/l with normal respiratory effort HEART: regular rate & rhythm ABDOMEN:  normoactive bowel sounds , non tender, not distended. Extremity: no pedal edema PSYCH: alert & oriented x 3 with fluent speech NEURO: no focal motor/sensory deficits  LABORATORY DATA:  I have reviewed the data as listed  CBC Latest Ref Rng & Units 06/17/2019 03/18/2019 12/17/2018  WBC 4.0 - 10.5 K/uL 8.4 8.5 12.1(H)  Hemoglobin 12.0 - 15.0 g/dL 11.1(L) 11.0(L) 10.4(L)  Hematocrit 36.0 - 46.0 % 35.8(L) 35.6(L) 34.2(L)  Platelets 150 - 400 K/uL 119(L) 128(L) 143(L)    CBC    Component Value Date/Time   WBC 8.4 06/17/2019 1040   RBC 3.97 06/17/2019 1040   HGB 11.1 (L) 06/17/2019 1040   HGB 9.5 (L) 05/01/2017 0921   HGB 10.4 (L) 12/02/2016 0900   HCT 35.8 (L) 06/17/2019 1040   HCT 32.4 (L) 12/02/2016 0900   PLT 119 (L) 06/17/2019 1040   PLT 130 (L) 05/01/2017 0921   PLT 131 (L) 12/02/2016 0900   MCV 90.2 06/17/2019 1040   MCV 78.8 (L) 12/02/2016 0900   MCH 28.0 06/17/2019 1040   MCHC 31.0 06/17/2019 1040   RDW 14.7 06/17/2019 1040   RDW 19.1 (H) 12/02/2016 0900   LYMPHSABS 4.7 (H) 06/17/2019 1040   LYMPHSABS 10.6 (H) 12/02/2016 0900   MONOABS 1.2 (H) 06/17/2019 1040   MONOABS 1.0 (H) 12/02/2016 0900   EOSABS 0.2 06/17/2019 1040   EOSABS 0.2 12/02/2016 0900   BASOSABS 0.0 06/17/2019 1040   BASOSABS  0.0  12/02/2016 0900   CMP Latest Ref Rng & Units 06/17/2019 03/18/2019 12/17/2018  Glucose 70 - 99 mg/dL 87 80 85  BUN 8 - 23 mg/dL 6(L) 13 13  Creatinine 0.44 - 1.00 mg/dL 0.95 0.93 1.04(H)  Sodium 135 - 145 mmol/L 146(H) 143 142  Potassium 3.5 - 5.1 mmol/L 3.5 3.4(L) 3.9  Chloride 98 - 111 mmol/L 112(H) 109 107  CO2 22 - 32 mmol/L 24 23 24   Calcium 8.9 - 10.3 mg/dL 9.5 10.2 10.3  Total Protein 6.5 - 8.1 g/dL 8.1 9.6(H) 8.8(H)  Total Bilirubin 0.3 - 1.2 mg/dL 0.5 0.6 0.6  Alkaline Phos 38 - 126 U/L 51 57 57  AST 15 - 41 U/L 15 15 14(L)  ALT 0 - 44 U/L 9 7 8    Lab Results  Component Value Date   IRON 39 (L) 06/17/2019   TIBC 228 (L) 06/17/2019   IRONPCTSAT 17 (L) 06/17/2019   (Iron and TIBC)    Lab Results  Component Value Date   FERRITIN 177 06/17/2019        RADIOGRAPHIC STUDIES: I have personally reviewed the radiological images as listed and agreed with the findings in the report.  PET/CT scan 08/18/2016 IMPRESSION: 1. Scattered primarily mild adenopathy most notable in the bilateral inguinal regions, but also with involvement of the left internal mammary chain, left axilla, retroperitoneum, gastrohepatic ligament, and of a pericardial lymph node. Questionable involvement of a small right station 2 lymph node and at the right axilla. Much of this is Deauville 4 disease, with some Deauville 3 and of L2 disease as noted above. 2. Faintly accentuated activity in the spleen without focal activity or overt splenomegaly. This may merit surveillance.  3. There is some Deauville 3 level activity associated with mild soft tissue prominence of the labia majora. This could be simply incidental. 4. Other imaging findings of potential clinical significance: Aortic Atherosclerosis (ICD10-I70.0). Coronary atherosclerosis. Chronic left occipital lobe encephalomalacia. Hepatic cysts and potential scarring in the right hepatic lobe. Pancreas divisum. Chronic degenerative anterolisthesis at  L4-5. Bony demineralization.   Electronically Signed   By: Van Clines M.D.   On: 08/18/2016 13:09   ASSESSMENT & PLAN:   86 y.o. female with multiple medical comorbidities with  #1 Low grade Non Hodgkins B cell lymphoma NOS  Flow cytometry suggestive of Cd20+ CD5-CD 10 neg Low grade Non Hodgkins lymphoma. Concern for possible CD5 neg chronic lymphocytic leukemia versus low grade non-Hodgkin's lymphoma NOS  Patient did have some borderline abdominal and inguinal lymphadenopathy on her CT abdomen pelvis in November 2017.  PET/CT 6/25 showed Scattered primarily mild adenopathy most notable in the bilateral inguinal regions, but also with involvement of the left internal mammary chain, left axilla, retroperitoneum, gastrohepatic ligament, and of a pericardial lymph node. Questionable involvement of a small right station 2 lymph node and at the right axilla. Much of this is Deauville 4 disease, with some Deauville 3 and of L2 disease as noted above. 2. Faintly accentuated activity in the spleen without focal activity or overt splenomegaly.  CLL FISH Prognostic panel -- did not show any mutation typical for CLL. Less likely CD5 neg CLL US guided Bx of inguinal LN was done- however only limited lymphoid tissue was obtained and the biopsy is nondiagnostic. Patient had previously decided to hold off  On an excisional lymph node biopsy  #2 Anemia  - microcytic anemia with some element to iron deficiency.  Iron /ferritin levels much improved after IV iron .  Lab Results  Component Value Date   IRON 39 (L) 06/17/2019   TIBC 228 (L) 06/17/2019   IRONPCTSAT 17 (L) 06/17/2019   (Iron and TIBC)  Lab Results  Component Value Date   FERRITIN 177 06/17/2019    #3 B12 deficiency. B12 levels are coming up from 116 now up to 295 to 722 with oral B12 replacement. No evidence of pernicious anemia based on neg antibody testing. B12 - 705  #4 Mild thrombocytopenia-  could be related to  her NHL -Will continue to monitor.  #5 Weight Loss Wt Readings from Last 3 Encounters:  06/17/19 110 lb 14.4 oz (50.3 kg)  03/18/19 106 lb 11.2 oz (48.4 kg)  12/17/18 105 lb 1.6 oz (47.7 kg)   PLAN: -Discussed pt labwork today, 06/17/19; of CBC w/diff and CMP is as follows: all values are WNL except for Hemoglobin at 11.1, HCT at 35.8, Platelets at 119K, Lymph's Abs at 4.7K, Monocytes Absolute at 1.2KSodium at 146, Chloride at 112, BUN at 6, Albumin at 3.2, GFR, Est Non Af Am at 53 -Discussed 06/17/19 of Ferritin at 177 -Discussed 06/17/19 of Iron and TIBC is as follows: all values are WNL except for Iron at 39, TIBC at 228, Saturation Ratios at 17 -Advised on treatment -continue to watch every 4 months   -Advised on rash/itching -dry skin, mild soap -Recommends continue f/u with urologist  -Recommends continuing Iron pills and Vitamin B12  -Will see back earlier if new symptoms arise  -Reviewed her medical diagnoses of low grade lymphoma and when to treat. Advised that the disease is not significantly affecting blood counts or affecting lymphs and that treatment is not recommended. Possible rx side effects could make her feel worse.  -Will see back in 4 months   FOLLOW UP: RTC with Dr Irene Limbo with labs in 4 months  The total time spent in the appt was 20 minutes and more than 50% was on counseling and direct patient cares.  All of the patient's questions were answered with apparent satisfaction. The patient knows to call the clinic with any problems, questions or concerns.  Sullivan Lone MD [place] AAHIVMS [facility] Hematology/Oncology Physician [facility]  (Office):       [phone] (Work cell):  [phone] (Fax):           [phone]  I, Dawayne Cirri am acting as a scribe for Dr. Sullivan Lone.   .I have reviewed the above documentation for accuracy and completeness, and I agree with the above.  Brunetta Genera MD

## 2019-06-17 ENCOUNTER — Encounter (INDEPENDENT_AMBULATORY_CARE_PROVIDER_SITE_OTHER): Payer: Self-pay

## 2019-06-17 ENCOUNTER — Other Ambulatory Visit: Payer: Self-pay | Admitting: Hematology

## 2019-06-17 ENCOUNTER — Telehealth: Payer: Self-pay | Admitting: Hematology

## 2019-06-17 ENCOUNTER — Inpatient Hospital Stay: Payer: Medicare HMO | Attending: Hematology | Admitting: Hematology

## 2019-06-17 ENCOUNTER — Inpatient Hospital Stay: Payer: Medicare HMO

## 2019-06-17 ENCOUNTER — Other Ambulatory Visit: Payer: Self-pay

## 2019-06-17 VITALS — BP 137/88 | HR 74 | Temp 98.2°F | Resp 18 | Ht 66.0 in | Wt 110.9 lb

## 2019-06-17 DIAGNOSIS — R634 Abnormal weight loss: Secondary | ICD-10-CM | POA: Diagnosis not present

## 2019-06-17 DIAGNOSIS — D696 Thrombocytopenia, unspecified: Secondary | ICD-10-CM

## 2019-06-17 DIAGNOSIS — D649 Anemia, unspecified: Secondary | ICD-10-CM

## 2019-06-17 DIAGNOSIS — Z87891 Personal history of nicotine dependence: Secondary | ICD-10-CM | POA: Insufficient documentation

## 2019-06-17 DIAGNOSIS — Z90710 Acquired absence of both cervix and uterus: Secondary | ICD-10-CM | POA: Insufficient documentation

## 2019-06-17 DIAGNOSIS — Z86711 Personal history of pulmonary embolism: Secondary | ICD-10-CM | POA: Insufficient documentation

## 2019-06-17 DIAGNOSIS — D500 Iron deficiency anemia secondary to blood loss (chronic): Secondary | ICD-10-CM

## 2019-06-17 DIAGNOSIS — C8308 Small cell B-cell lymphoma, lymph nodes of multiple sites: Secondary | ICD-10-CM

## 2019-06-17 DIAGNOSIS — E538 Deficiency of other specified B group vitamins: Secondary | ICD-10-CM | POA: Diagnosis not present

## 2019-06-17 DIAGNOSIS — Z7901 Long term (current) use of anticoagulants: Secondary | ICD-10-CM | POA: Diagnosis not present

## 2019-06-17 DIAGNOSIS — D509 Iron deficiency anemia, unspecified: Secondary | ICD-10-CM | POA: Insufficient documentation

## 2019-06-17 DIAGNOSIS — I480 Paroxysmal atrial fibrillation: Secondary | ICD-10-CM | POA: Insufficient documentation

## 2019-06-17 LAB — CMP (CANCER CENTER ONLY)
ALT: 9 U/L (ref 0–44)
AST: 15 U/L (ref 15–41)
Albumin: 3.2 g/dL — ABNORMAL LOW (ref 3.5–5.0)
Alkaline Phosphatase: 51 U/L (ref 38–126)
Anion gap: 10 (ref 5–15)
BUN: 6 mg/dL — ABNORMAL LOW (ref 8–23)
CO2: 24 mmol/L (ref 22–32)
Calcium: 9.5 mg/dL (ref 8.9–10.3)
Chloride: 112 mmol/L — ABNORMAL HIGH (ref 98–111)
Creatinine: 0.95 mg/dL (ref 0.44–1.00)
GFR, Est AFR Am: >60 mL/min
GFR, Estimated: 53 mL/min — ABNORMAL LOW
Glucose, Bld: 87 mg/dL (ref 70–99)
Potassium: 3.5 mmol/L (ref 3.5–5.1)
Sodium: 146 mmol/L — ABNORMAL HIGH (ref 135–145)
Total Bilirubin: 0.5 mg/dL (ref 0.3–1.2)
Total Protein: 8.1 g/dL (ref 6.5–8.1)

## 2019-06-17 LAB — IRON AND TIBC
Iron: 39 ug/dL — ABNORMAL LOW (ref 41–142)
Saturation Ratios: 17 % — ABNORMAL LOW (ref 21–57)
TIBC: 228 ug/dL — ABNORMAL LOW (ref 236–444)
UIBC: 188 ug/dL (ref 120–384)

## 2019-06-17 LAB — CBC WITH DIFFERENTIAL/PLATELET
Abs Immature Granulocytes: 0.01 K/uL (ref 0.00–0.07)
Basophils Absolute: 0 K/uL (ref 0.0–0.1)
Basophils Relative: 0 %
Eosinophils Absolute: 0.2 K/uL (ref 0.0–0.5)
Eosinophils Relative: 2 %
HCT: 35.8 % — ABNORMAL LOW (ref 36.0–46.0)
Hemoglobin: 11.1 g/dL — ABNORMAL LOW (ref 12.0–15.0)
Immature Granulocytes: 0 %
Lymphocytes Relative: 56 %
Lymphs Abs: 4.7 K/uL — ABNORMAL HIGH (ref 0.7–4.0)
MCH: 28 pg (ref 26.0–34.0)
MCHC: 31 g/dL (ref 30.0–36.0)
MCV: 90.2 fL (ref 80.0–100.0)
Monocytes Absolute: 1.2 K/uL — ABNORMAL HIGH (ref 0.1–1.0)
Monocytes Relative: 14 %
Neutro Abs: 2.3 K/uL (ref 1.7–7.7)
Neutrophils Relative %: 28 %
Platelets: 119 K/uL — ABNORMAL LOW (ref 150–400)
RBC: 3.97 MIL/uL (ref 3.87–5.11)
RDW: 14.7 % (ref 11.5–15.5)
WBC: 8.4 K/uL (ref 4.0–10.5)
nRBC: 0 % (ref 0.0–0.2)

## 2019-06-17 LAB — FERRITIN: Ferritin: 177 ng/mL (ref 11–307)

## 2019-06-17 NOTE — Telephone Encounter (Signed)
Please review and refill if still needed

## 2019-06-17 NOTE — Telephone Encounter (Signed)
Scheduled per 04/23 los, patient has been called an notified.

## 2019-06-23 DIAGNOSIS — N390 Urinary tract infection, site not specified: Secondary | ICD-10-CM | POA: Diagnosis not present

## 2019-06-23 DIAGNOSIS — N3941 Urge incontinence: Secondary | ICD-10-CM | POA: Diagnosis not present

## 2019-06-23 DIAGNOSIS — N952 Postmenopausal atrophic vaginitis: Secondary | ICD-10-CM | POA: Diagnosis not present

## 2019-06-23 DIAGNOSIS — N3281 Overactive bladder: Secondary | ICD-10-CM | POA: Diagnosis not present

## 2019-06-27 DIAGNOSIS — D500 Iron deficiency anemia secondary to blood loss (chronic): Secondary | ICD-10-CM | POA: Diagnosis not present

## 2019-06-27 DIAGNOSIS — C8308 Small cell B-cell lymphoma, lymph nodes of multiple sites: Secondary | ICD-10-CM | POA: Diagnosis not present

## 2019-06-27 DIAGNOSIS — I10 Essential (primary) hypertension: Secondary | ICD-10-CM | POA: Diagnosis not present

## 2019-06-27 DIAGNOSIS — E785 Hyperlipidemia, unspecified: Secondary | ICD-10-CM | POA: Diagnosis not present

## 2019-09-22 DIAGNOSIS — N3281 Overactive bladder: Secondary | ICD-10-CM | POA: Diagnosis not present

## 2019-09-22 DIAGNOSIS — N390 Urinary tract infection, site not specified: Secondary | ICD-10-CM | POA: Diagnosis not present

## 2019-09-22 DIAGNOSIS — N3941 Urge incontinence: Secondary | ICD-10-CM | POA: Diagnosis not present

## 2019-09-22 DIAGNOSIS — N952 Postmenopausal atrophic vaginitis: Secondary | ICD-10-CM | POA: Diagnosis not present

## 2019-10-14 ENCOUNTER — Ambulatory Visit: Payer: Medicare HMO | Admitting: Hematology

## 2019-10-14 ENCOUNTER — Other Ambulatory Visit: Payer: Medicare HMO

## 2019-10-17 ENCOUNTER — Inpatient Hospital Stay: Payer: Medicare HMO | Attending: Hematology

## 2019-10-17 ENCOUNTER — Telehealth: Payer: Self-pay | Admitting: Hematology

## 2019-10-17 ENCOUNTER — Other Ambulatory Visit: Payer: Self-pay

## 2019-10-17 ENCOUNTER — Inpatient Hospital Stay: Payer: Medicare HMO | Admitting: Hematology

## 2019-10-17 VITALS — BP 151/93 | HR 66 | Temp 97.6°F | Resp 18 | Ht 66.0 in | Wt 111.5 lb

## 2019-10-17 DIAGNOSIS — D649 Anemia, unspecified: Secondary | ICD-10-CM

## 2019-10-17 DIAGNOSIS — E876 Hypokalemia: Secondary | ICD-10-CM | POA: Insufficient documentation

## 2019-10-17 DIAGNOSIS — Z87891 Personal history of nicotine dependence: Secondary | ICD-10-CM | POA: Insufficient documentation

## 2019-10-17 DIAGNOSIS — I10 Essential (primary) hypertension: Secondary | ICD-10-CM | POA: Diagnosis not present

## 2019-10-17 DIAGNOSIS — D509 Iron deficiency anemia, unspecified: Secondary | ICD-10-CM | POA: Diagnosis not present

## 2019-10-17 DIAGNOSIS — Z8572 Personal history of non-Hodgkin lymphomas: Secondary | ICD-10-CM | POA: Diagnosis not present

## 2019-10-17 DIAGNOSIS — Z90710 Acquired absence of both cervix and uterus: Secondary | ICD-10-CM | POA: Insufficient documentation

## 2019-10-17 DIAGNOSIS — E785 Hyperlipidemia, unspecified: Secondary | ICD-10-CM | POA: Insufficient documentation

## 2019-10-17 DIAGNOSIS — M25461 Effusion, right knee: Secondary | ICD-10-CM | POA: Insufficient documentation

## 2019-10-17 DIAGNOSIS — Z8541 Personal history of malignant neoplasm of cervix uteri: Secondary | ICD-10-CM | POA: Insufficient documentation

## 2019-10-17 DIAGNOSIS — Z86711 Personal history of pulmonary embolism: Secondary | ICD-10-CM | POA: Insufficient documentation

## 2019-10-17 DIAGNOSIS — Z7901 Long term (current) use of anticoagulants: Secondary | ICD-10-CM | POA: Diagnosis not present

## 2019-10-17 DIAGNOSIS — E860 Dehydration: Secondary | ICD-10-CM | POA: Insufficient documentation

## 2019-10-17 DIAGNOSIS — E538 Deficiency of other specified B group vitamins: Secondary | ICD-10-CM | POA: Diagnosis not present

## 2019-10-17 DIAGNOSIS — R634 Abnormal weight loss: Secondary | ICD-10-CM | POA: Insufficient documentation

## 2019-10-17 DIAGNOSIS — I480 Paroxysmal atrial fibrillation: Secondary | ICD-10-CM | POA: Diagnosis not present

## 2019-10-17 DIAGNOSIS — C8308 Small cell B-cell lymphoma, lymph nodes of multiple sites: Secondary | ICD-10-CM

## 2019-10-17 DIAGNOSIS — D696 Thrombocytopenia, unspecified: Secondary | ICD-10-CM | POA: Insufficient documentation

## 2019-10-17 LAB — CMP (CANCER CENTER ONLY)
ALT: <6 U/L (ref 0–44)
AST: 11 U/L — ABNORMAL LOW (ref 15–41)
Albumin: 3.2 g/dL — ABNORMAL LOW (ref 3.5–5.0)
Alkaline Phosphatase: 56 U/L (ref 38–126)
Anion gap: 7 (ref 5–15)
BUN: 12 mg/dL (ref 8–23)
CO2: 22 mmol/L (ref 22–32)
Calcium: 10.4 mg/dL — ABNORMAL HIGH (ref 8.9–10.3)
Chloride: 111 mmol/L (ref 98–111)
Creatinine: 0.99 mg/dL (ref 0.44–1.00)
GFR, Est AFR Am: 59 mL/min — ABNORMAL LOW (ref >60)
GFR, Estimated: 51 mL/min — ABNORMAL LOW (ref >60)
Glucose, Bld: 91 mg/dL (ref 70–99)
Potassium: 3.2 mmol/L — ABNORMAL LOW (ref 3.5–5.1)
Sodium: 140 mmol/L (ref 135–145)
Total Bilirubin: 0.6 mg/dL (ref 0.3–1.2)
Total Protein: 8.2 g/dL — ABNORMAL HIGH (ref 6.5–8.1)

## 2019-10-17 LAB — CBC WITH DIFFERENTIAL/PLATELET
Abs Immature Granulocytes: 0.01 10*3/uL (ref 0.00–0.07)
Basophils Absolute: 0 10*3/uL (ref 0.0–0.1)
Basophils Relative: 1 %
Eosinophils Absolute: 0.2 10*3/uL (ref 0.0–0.5)
Eosinophils Relative: 3 %
HCT: 35.8 % — ABNORMAL LOW (ref 36.0–46.0)
Hemoglobin: 11.2 g/dL — ABNORMAL LOW (ref 12.0–15.0)
Immature Granulocytes: 0 %
Lymphocytes Relative: 49 %
Lymphs Abs: 3 10*3/uL (ref 0.7–4.0)
MCH: 27.3 pg (ref 26.0–34.0)
MCHC: 31.3 g/dL (ref 30.0–36.0)
MCV: 87.1 fL (ref 80.0–100.0)
Monocytes Absolute: 0.5 10*3/uL (ref 0.1–1.0)
Monocytes Relative: 9 %
Neutro Abs: 2.3 10*3/uL (ref 1.7–7.7)
Neutrophils Relative %: 38 %
Platelets: 146 10*3/uL — ABNORMAL LOW (ref 150–400)
RBC: 4.11 MIL/uL (ref 3.87–5.11)
RDW: 15.1 % (ref 11.5–15.5)
WBC: 6.1 10*3/uL (ref 4.0–10.5)
nRBC: 0 % (ref 0.0–0.2)

## 2019-10-17 LAB — FERRITIN: Ferritin: 187 ng/mL (ref 11–307)

## 2019-10-17 LAB — LACTATE DEHYDROGENASE: LDH: 175 U/L (ref 98–192)

## 2019-10-17 LAB — VITAMIN B12: Vitamin B-12: 1423 pg/mL — ABNORMAL HIGH (ref 180–914)

## 2019-10-17 NOTE — Telephone Encounter (Signed)
Scheduled per los. Gave avs and calendar  

## 2019-10-17 NOTE — Progress Notes (Signed)
HEMATOLOGY/ONCOLOGY CLINIC NOTE  Date of Service: 10/17/19   Patient Care Team: Carol Ada, MD as PCP - General (Family Medicine)  CHIEF COMPLAINTS/PURPOSE OF CONSULTATION:   F/u for continue mx of low grade NHL Iron def Anemia  HISTORY OF PRESENTING ILLNESS:   Sara Butler is a wonderful 86 y.o. female who has been referred to [place] by Dr .Carol Ada, MD  for evaluation and management of increasing lymphocytosis, anemia and thrombocytopenia.  Patient has a history of hypertension, dyslipidemia, paroxysmal atrial fibrillation on Coumadin, previous history of pulmonary embolism in 2013, history of iron deficiency due to chronic GI losses on oral iron therapy, GERD.  Patient had recent labs with her primary care physician on 05/01/2016 which showed total WBC count of 10.2k with 6.6k lymphocytes, mild anemia with hemoglobin of 11.4 and somewhat microcytic in dialysis with an MCV of 80 and an RDW of 19. She was also noted to have mild thrombocytopenia with a platelet count of 143k.  Patient had workup including getting TSH which is within normal limits. Noted to have significant B12 deficiency with a B12 level of 116. Has been started on oral B12 5000 g daily by her primary care physician. Recommended considering sublingual B12 replacement. Would need to rule out pernicious anemia.   Patient notes that she has been on oral iron replacement on and off.  She reports issues with what is thought to be irritable bowel syndrome for which she is on Myrbetriq.  Patient notes that she had some abdominal discomfort and had a CT of the abdomen and pelvis on 01/01/2016 which showed Prominent bilateral inguinal as well as gastrohepatic lymphadenopathy. These are new from the prior exam of 2014 in the gastrohepatic changes appear to be new from the recent exam from 06/20/2015.  Patient notes no fevers no chills no night sweats no significant unexpected weight loss. She denies any overt GI  bleeding. No other issues with overt nosebleeds gum bleeds or other sources of bleeding  INTERVAL HISTORY:    Sara Butler is her for f/u on her lymphoproliferative disorder -low grade NHL NOS. The patient's last visit with [place] was on 06/17/2019. The pt reports that she is doing well overall.  The pt reports that she has been eating well and feeling well. Pt woke up one night to go to the restroom and discovered pain in her right knee. She notes that the knee began to swell over the next week. It is currently still swollen and somewhat sore. Pt continues to have frequent UTIs. She believes that she currently has a UTI because she has had urinary discomfort for the last few months.   Lab results today (10/17/19) of CBC w/diff and CMP is as follows: all values are WNL except for Hgb at 11.2, HCT at 35.8, PLT at 146K, Potassium at 3.2, Calcium at 10.4, Total Protein at 8.2, Albumin at 3.2, AST at 11, GFR Est Af Am at 59 10/17/2019 LDH at 175 10/17/2019 Ferritin at 187 10/17/2019 Vitamin B12 is 1423  On review of systems, pt reports urinary incontinence, urinary discomfort, right knee pain and denies unexpected weight loss, low appetite, fevers, chills, night sweats and any other symptoms.   MEDICAL HISTORY:  Past Medical History:  Diagnosis Date  . Atrial fibrillation ([place])   . Bleeding from anus   . Cervical cancer ([place])   . Diverticulitis of colon   . GERD (gastroesophageal reflux disease)   . Gout   . Hyperlipidemia   .  Hypertension   . Pulmonary embolism ([facility])   Iron deficiency anemia due to chronic blood loss Paroxysmal atrial fibrillation on Coumadin Diverticulosis of the large and this time Vitamin D deficiency Previous history of pulmonary embolism in 2013 Overactive bladder Osteoarthritis of the knee Chronic back pain History of previous GI bleed in 2011 (while on pradaxa)  SURGICAL HISTORY: Past Surgical History:  Procedure Laterality Date  . ABDOMINAL HYSTERECTOMY     . BACK SURGERY      SOCIAL HISTORY: Social History   Socioeconomic History  . Marital status: Widowed    Spouse name: Not on file  . Number of children: 4  . Years of education: 51  . Highest education level: Not on file  Occupational History  . Occupation: Retired Museum/gallery curator at [employer]  . Smoking status: Former Smoker    Packs/day: 0.10    Years: 58.00    Pack years: 5.80    Types: Cigarettes  . Smokeless tobacco: Never Used  Vaping Use  . Vaping Use: Never used  Substance and Sexual Activity  . Alcohol use: No  . Drug use: No  . Sexual activity: Never  Other Topics Concern  . Not on file  Social History Narrative   Widowed. Lives alone.  Normally independent of ADLs and ambulation.   Social Determinants of Health   Financial Resource Strain:   . Difficulty of Paying Living Expenses: Not on file  Food Insecurity:   . Worried About Charity fundraiser in the Last Year: Not on file  . Ran Out of Food in the Last Year: Not on file  Transportation Needs:   . Lack of Transportation (Medical): Not on file  . Lack of Transportation (Non-Medical): Not on file  Physical Activity:   . Days of Exercise per Week: Not on file  . Minutes of Exercise per Session: Not on file  Stress:   . Feeling of Stress : Not on file  Social Connections:   . Frequency of Communication with Friends and Family: Not on file  . Frequency of Social Gatherings with Friends and Family: Not on file  . Attends Religious Services: Not on file  . Active Member of Clubs or Organizations: Not on file  . Attends Archivist Meetings: Not on file  . Marital Status: Not on file  Intimate Partner Violence:   . Fear of Current or Ex-Partner: Not on file  . Emotionally Abused: Not on file  . Physically Abused: Not on file  . Sexually Abused: Not on file    FAMILY HISTORY: Family History  Problem Relation Age of Onset  . Heart failure Mother   . Diabetes Brother   . Cancer Neg  Hx     ALLERGIES:  has No Known Allergies.  MEDICATIONS:  Current Outpatient Medications  Medication Sig Dispense Refill  . acetaminophen (TYLENOL) 500 MG tablet Take 500 mg by mouth every 6 (six) hours as needed for headache.    Marland Kitchen amLODipine (NORVASC) 2.5 MG tablet Take 2.5 mg by mouth daily.    Marland Kitchen atenolol (TENORMIN) 50 MG tablet Take 50 mg daily by mouth.     . Cyanocobalamin (B-12) 1000 MCG SUBL Place 1,000 mcg under the tongue daily. 30 each 3  . diclofenac sodium (VOLTAREN) 1 % GEL Apply 2 g topically daily as needed (pain).     Marland Kitchen estradiol (ESTRACE) 0.1 MG/GM vaginal cream Discard plastic applicator. Insert blueberry size amount of cream on finger in vagina  daily x1 week then every other night.    . hydrocortisone 2.5 % lotion APPLY TO AREA OF ECZEMATOID RASH AND PRURITIS ON UPPER EXTREMITIES TWICE DAILY 59 mL 1  . MYRBETRIQ 25 MG TB24 tablet     . oxybutynin (DITROPAN-XL) 10 MG 24 hr tablet     . phenazopyridine (PYRIDIUM) 100 MG tablet Take 1 tablet (100 mg total) by mouth 3 (three) times daily as needed for pain. 15 tablet 0  . rivaroxaban (XARELTO) 10 MG TABS tablet Take 10 mg by mouth daily.    . cephALEXin (KEFLEX) 250 MG capsule Take 1 capsule (250 mg total) by mouth 3 (three) times daily. (Patient not taking: Reported on 10/17/2019) 21 capsule 0  . dexamethasone (DECADRON) 2 MG tablet TAKE 1 TABLET BY MOUTH DAILY WITH BREAKFAST (Patient not taking: Reported on 10/17/2019) 30 tablet 0  . iron polysaccharides (NIFEREX) 150 MG capsule Take 1 capsule (150 mg total) by mouth daily. (Patient not taking: Reported on 10/17/2019) 30 capsule 2  . potassium chloride SA (K-DUR,KLOR-CON) 20 MEQ tablet Take 1 tablet (20 mEq total) by mouth 2 (two) times daily for 10 days. (Patient not taking: Reported on 10/23/2017) 20 tablet 0  . warfarin (COUMADIN) 5 MG tablet Take 5-7.5 mg by mouth daily. Take 5 mg on Monday and Tuesday, all other days take 7.5 mg (Patient not taking: Reported on 10/17/2019)      No current facility-administered medications for this visit.    REVIEW OF SYSTEMS:   A 10+ POINT REVIEW OF SYSTEMS WAS OBTAINED including neurology, dermatology, psychiatry, cardiac, respiratory, lymph, extremities, GI, GU, Musculoskeletal, constitutional, breasts, reproductive, HEENT.  All pertinent positives are noted in the HPI.  All others are negative.   PHYSICAL EXAMINATION:  ECOG FS:2 - Symptomatic, <50% confined to bed  Vitals:   10/17/19 1339  BP: (!) 151/93  Pulse: 66  Resp: 18  Temp: 97.6 F (36.4 C)  SpO2: 100%   Wt Readings from Last 3 Encounters:  10/17/19 111 lb 8 oz (50.6 kg)  06/17/19 110 lb 14.4 oz (50.3 kg)  03/18/19 106 lb 11.2 oz (48.4 kg)   Body mass index is 18 kg/m.    GENERAL:alert, in no acute distress and comfortable SKIN: no acute rashes, no significant lesions EYES: conjunctiva are pink and non-injected, sclera anicteric OROPHARYNX: MMM, no exudates, no oropharyngeal erythema or ulceration NECK: supple, no JVD LYMPH:  no palpable lymphadenopathy in the cervical, axillary or inguinal regions LUNGS: clear to auscultation b/l with normal respiratory effort HEART: regular rate & rhythm ABDOMEN:  normoactive bowel sounds , non tender, not distended. No palpable hepatosplenomegaly.  Extremity: no pedal edema, right knee edema - no erythema or excess warmth PSYCH: alert & oriented x 3 with fluent speech NEURO: no focal motor/sensory deficits  LABORATORY DATA:  I have reviewed the data as listed  CBC Latest Ref Rng & Units 10/17/2019 06/17/2019 03/18/2019  WBC 4.0 - 10.5 K/uL 6.1 8.4 8.5  Hemoglobin 12.0 - 15.0 g/dL 11.2(L) 11.1(L) 11.0(L)  Hematocrit 36 - 46 % 35.8(L) 35.8(L) 35.6(L)  Platelets 150 - 400 K/uL 146(L) 119(L) 128(L)    CBC    Component Value Date/Time   WBC 6.1 10/17/2019 1325   RBC 4.11 10/17/2019 1325   HGB 11.2 (L) 10/17/2019 1325   HGB 9.5 (L) 05/01/2017 0921   HGB 10.4 (L) 12/02/2016 0900   HCT 35.8 (L) 10/17/2019  1325   HCT 32.4 (L) 12/02/2016 0900   PLT 146 (L) 10/17/2019 1325  PLT 130 (L) 05/01/2017 0921   PLT 131 (L) 12/02/2016 0900   MCV 87.1 10/17/2019 1325   MCV 78.8 (L) 12/02/2016 0900   MCH 27.3 10/17/2019 1325   MCHC 31.3 10/17/2019 1325   RDW 15.1 10/17/2019 1325   RDW 19.1 (H) 12/02/2016 0900   LYMPHSABS 3.0 10/17/2019 1325   LYMPHSABS 10.6 (H) 12/02/2016 0900   MONOABS 0.5 10/17/2019 1325   MONOABS 1.0 (H) 12/02/2016 0900   EOSABS 0.2 10/17/2019 1325   EOSABS 0.2 12/02/2016 0900   BASOSABS 0.0 10/17/2019 1325   BASOSABS 0.0 12/02/2016 0900   CMP Latest Ref Rng & Units 10/17/2019 06/17/2019 03/18/2019  Glucose 70 - 99 mg/dL 91 87 80  BUN 8 - 23 mg/dL 12 6(L) 13  Creatinine 0.44 - 1.00 mg/dL 0.99 0.95 0.93  Sodium 135 - 145 mmol/L 140 146(H) 143  Potassium 3.5 - 5.1 mmol/L 3.2(L) 3.5 3.4(L)  Chloride 98 - 111 mmol/L 111 112(H) 109  CO2 22 - 32 mmol/L 22 24 23   Calcium 8.9 - 10.3 mg/dL 10.4(H) 9.5 10.2  Total Protein 6.5 - 8.1 g/dL 8.2(H) 8.1 9.6(H)  Total Bilirubin 0.3 - 1.2 mg/dL 0.6 0.5 0.6  Alkaline Phos 38 - 126 U/L 56 51 57  AST 15 - 41 U/L 11(L) 15 15  ALT 0 - 44 U/L 6 9 7    Lab Results  Component Value Date   IRON 39 (L) 06/17/2019   TIBC 228 (L) 06/17/2019   IRONPCTSAT 17 (L) 06/17/2019   (Iron and TIBC)    Lab Results  Component Value Date   FERRITIN 187 10/17/2019        RADIOGRAPHIC STUDIES: I have personally reviewed the radiological images as listed and agreed with the findings in the report.  PET/CT scan 08/18/2016 IMPRESSION: 1. Scattered primarily mild adenopathy most notable in the bilateral inguinal regions, but also with involvement of the left internal mammary chain, left axilla, retroperitoneum, gastrohepatic ligament, and of a pericardial lymph node. Questionable involvement of a small right station 2 lymph node and at the right axilla. Much of this is Deauville 4 disease, with some Deauville 3 and of L2 disease as noted above. 2.  Faintly accentuated activity in the spleen without focal activity or overt splenomegaly. This may merit surveillance.  3. There is some Deauville 3 level activity associated with mild soft tissue prominence of the labia majora. This could be simply incidental. 4. Other imaging findings of potential clinical significance: Aortic Atherosclerosis (ICD10-I70.0). Coronary atherosclerosis. Chronic left occipital lobe encephalomalacia. Hepatic cysts and potential scarring in the right hepatic lobe. Pancreas divisum. Chronic degenerative anterolisthesis at L4-5. Bony demineralization.   Electronically Signed   By: Van Clines M.D.   On: 08/18/2016 13:09   ASSESSMENT & PLAN:   86 y.o. female with multiple medical comorbidities with  #1 Low grade Non Hodgkins B cell lymphoma NOS  Flow cytometry suggestive of Cd20+ CD5-CD 10 neg Low grade Non Hodgkins lymphoma. Concern for possible CD5 neg chronic lymphocytic leukemia versus low grade non-Hodgkin's lymphoma NOS  Patient did have some borderline abdominal and inguinal lymphadenopathy on her CT abdomen pelvis in November 2017.  PET/CT 6/25 showed Scattered primarily mild adenopathy most notable in the bilateral inguinal regions, but also with involvement of the left internal mammary chain, left axilla, retroperitoneum, gastrohepatic ligament, and of a pericardial lymph node. Questionable involvement of a small right station 2 lymph node and at the right axilla. Much of this is Deauville 4 disease, with some Deauville  3 and of L2 disease as noted above. 2. Faintly accentuated activity in the spleen without focal activity or overt splenomegaly.  CLL FISH Prognostic panel -- did not show any mutation typical for CLL. Less likely CD5 neg CLL US guided Bx of inguinal LN was done- however only limited lymphoid tissue was obtained and the biopsy is nondiagnostic. Patient had previously decided to hold off  On an excisional lymph node biopsy  #2  Anemia  - microcytic anemia with some element to iron deficiency.  Iron /ferritin levels much improved after IV iron . Lab Results  Component Value Date   IRON 39 (L) 06/17/2019   TIBC 228 (L) 06/17/2019   IRONPCTSAT 17 (L) 06/17/2019   (Iron and TIBC)  Lab Results  Component Value Date   FERRITIN 187 10/17/2019    #3 B12 deficiency. B12 levels are coming up from 116 now up to 295 to 722 with oral B12 replacement. No evidence of pernicious anemia based on neg antibody testing. B12 - 705  #4 Mild thrombocytopenia-  could be related to her NHL -Will continue to monitor.  #5 Weight Loss Wt Readings from Last 3 Encounters:  10/17/19 111 lb 8 oz (50.6 kg)  06/17/19 110 lb 14.4 oz (50.3 kg)  03/18/19 106 lb 11.2 oz (48.4 kg)   PLAN: -Discussed pt labwork today, 10/17/19; blood counts look good, Potassium is low, blood chemistries reflect dehydration, LDH & Ferritin are WNL, Vitamin B12 is in progress -No lab or clinical evidence of B-cell Lymphoma progression at this time.  -No unreasonable for pt to hold PO Iron at this time as Ferritin is good.  -Recommend pt f/u with Urologist for evaluation of urinary discomfort -Recommend pt f/u with PCP for right knee swelling/tenderness -Can space out f/u due to stability of disease.  -Will see back in 6 months with labs    FOLLOW UP: RTC with Dr Irene Limbo with labs in 6 months   The total time spent in the appt was 20 minutes and more than 50% was on counseling and direct patient cares.  All of the patient's questions were answered with apparent satisfaction. The patient knows to call the clinic with any problems, questions or concerns.   Sullivan Lone MD [place] AAHIVMS [facility] Hematology/Oncology Physician [facility]  (Office):       [phone] (Work cell):  [phone] (Fax):           [phone]  I, Sara Butler, am acting as a scribe for Dr. Sullivan Lone.   .I have reviewed the above documentation for accuracy  and completeness, and I agree with the above. Brunetta Genera MD

## 2019-10-24 DIAGNOSIS — R944 Abnormal results of kidney function studies: Secondary | ICD-10-CM | POA: Diagnosis not present

## 2019-10-24 DIAGNOSIS — D500 Iron deficiency anemia secondary to blood loss (chronic): Secondary | ICD-10-CM | POA: Diagnosis not present

## 2019-10-24 DIAGNOSIS — I480 Paroxysmal atrial fibrillation: Secondary | ICD-10-CM | POA: Diagnosis not present

## 2019-10-24 DIAGNOSIS — E785 Hyperlipidemia, unspecified: Secondary | ICD-10-CM | POA: Diagnosis not present

## 2019-10-24 DIAGNOSIS — M179 Osteoarthritis of knee, unspecified: Secondary | ICD-10-CM | POA: Diagnosis not present

## 2019-10-24 DIAGNOSIS — I10 Essential (primary) hypertension: Secondary | ICD-10-CM | POA: Diagnosis not present

## 2019-12-29 DIAGNOSIS — I10 Essential (primary) hypertension: Secondary | ICD-10-CM | POA: Diagnosis not present

## 2019-12-29 DIAGNOSIS — C8308 Small cell B-cell lymphoma, lymph nodes of multiple sites: Secondary | ICD-10-CM | POA: Diagnosis not present

## 2019-12-29 DIAGNOSIS — N3281 Overactive bladder: Secondary | ICD-10-CM | POA: Diagnosis not present

## 2019-12-29 DIAGNOSIS — E785 Hyperlipidemia, unspecified: Secondary | ICD-10-CM | POA: Diagnosis not present

## 2019-12-29 DIAGNOSIS — Z7901 Long term (current) use of anticoagulants: Secondary | ICD-10-CM | POA: Diagnosis not present

## 2019-12-29 DIAGNOSIS — I480 Paroxysmal atrial fibrillation: Secondary | ICD-10-CM | POA: Diagnosis not present

## 2019-12-29 DIAGNOSIS — Z1389 Encounter for screening for other disorder: Secondary | ICD-10-CM | POA: Diagnosis not present

## 2019-12-29 DIAGNOSIS — Z0000 Encounter for general adult medical examination without abnormal findings: Secondary | ICD-10-CM | POA: Diagnosis not present

## 2020-03-03 ENCOUNTER — Ambulatory Visit: Payer: Medicare HMO | Attending: Internal Medicine

## 2020-03-03 DIAGNOSIS — Z23 Encounter for immunization: Secondary | ICD-10-CM

## 2020-03-03 NOTE — Progress Notes (Signed)
   Covid-19 Vaccination Clinic  Name:  Sara Butler    MRN: [id number] DOB: 11-Apr-1928  03/03/2020  [name] was observed post Covid-19 immunization for 15 minutes without incident. She was provided with Vaccine Information Sheet and instruction to access the V-Safe system.   [name] was instructed to call 911 with any severe reactions post vaccine: Marland Kitchen Difficulty breathing  . Swelling of face and throat  . A fast heartbeat  . A bad rash all over body  . Dizziness and weakness   Immunizations Administered    Name Date Dose VIS Date Route   Pfizer COVID-19 Vaccine 03/03/2020 11:22 AM 0.3 mL 12/14/2019 Intramuscular   Manufacturer: [place]   Lot: [id number]   NDC: [address]-0

## 2020-04-17 NOTE — Progress Notes (Signed)
HEMATOLOGY/ONCOLOGY CLINIC NOTE  Date of Service: 04/17/20   Patient Care Team: Carol Ada, MD as PCP - General (Family Medicine)  CHIEF COMPLAINTS/PURPOSE OF CONSULTATION:   F/u for continue mx of low grade NHL Iron def Anemia  HISTORY OF PRESENTING ILLNESS:   Sara Butler is a wonderful 87 y.o. female who has been referred to [place] by Dr .Carol Ada, MD  for evaluation and management of increasing lymphocytosis, anemia and thrombocytopenia.  Patient has a history of hypertension, dyslipidemia, paroxysmal atrial fibrillation on Coumadin, previous history of pulmonary embolism in 2013, history of iron deficiency due to chronic GI losses on oral iron therapy, GERD.  Patient had recent labs with her primary care physician on 05/01/2016 which showed total WBC count of 10.2k with 6.6k lymphocytes, mild anemia with hemoglobin of 11.4 and somewhat microcytic in dialysis with an MCV of 80 and an RDW of 19. She was also noted to have mild thrombocytopenia with a platelet count of 143k.  Patient had workup including getting TSH which is within normal limits. Noted to have significant B12 deficiency with a B12 level of 116. Has been started on oral B12 5000 g daily by her primary care physician. Recommended considering sublingual B12 replacement. Would need to rule out pernicious anemia.   Patient notes that she has been on oral iron replacement on and off.  She reports issues with what is thought to be irritable bowel syndrome for which she is on Myrbetriq.  Patient notes that she had some abdominal discomfort and had a CT of the abdomen and pelvis on 01/01/2016 which showed Prominent bilateral inguinal as well as gastrohepatic lymphadenopathy. These are new from the prior exam of 2014 in the gastrohepatic changes appear to be new from the recent exam from 06/20/2015.  Patient notes no fevers no chills no night sweats no significant unexpected weight loss. She denies any overt GI  bleeding. No other issues with overt nosebleeds gum bleeds or other sources of bleeding  INTERVAL HISTORY:     [name] is her for f/u on her lymphoproliferative disorder -low grade NHL NOS. The patient's last visit with [place] was on 10/17/2019. The pt reports that she is doing well overall. We are joined today by her granddaughter.   The pt reports no new symptoms or concerns, but continues to not feel well given her condition. Over the last six months, the pt notes she has been more lethargic and fatigued. She notes she no longer goes out to eat with her family nor cooks anymore. The pt notes that she sometimes gets mole-like spots on her body. The pt notes that not all of her medications are not on the list. The pt notes her stools have been darker, and is taking Iron Polysaccharide and Vitamin B12. The pt notes that she believes the fatigue is due to the pandemic, with some days being better than others. The pt notes she primarily lays around her house and watches TV for the majority of her time.  Lab results today 04/18/2020 of CBC w/diff and CMP is as follows: all values are WNL except for Hgb of 10.9, HCT of 34.2, RDW of 15.7, Plt of 133K, Creatinine of 1.02, Albumin of 3.4, GFR est of 53. 04/18/2020 Ferritin is stable at 183. 04/18/2020 LDH of 141.   On review of systems, pt reports black stools, fatigue and denies fevers, chills, night sweats, new lumps/bumps, unexpected weight loss, decreased appetite, abdominal pain or distention and  any other symptoms.  MEDICAL HISTORY:  Past Medical History:  Diagnosis Date  . Atrial fibrillation ([place])   . Bleeding from anus   . Cervical cancer ([place])   . Diverticulitis of colon   . GERD (gastroesophageal reflux disease)   . Gout   . Hyperlipidemia   . Hypertension   . Pulmonary embolism ([facility])   Iron deficiency anemia due to chronic blood loss Paroxysmal atrial fibrillation on Coumadin Diverticulosis of the large and this time Vitamin D  deficiency Previous history of pulmonary embolism in 2013 Overactive bladder Osteoarthritis of the knee Chronic back pain History of previous GI bleed in 2011 (while on pradaxa)  SURGICAL HISTORY: Past Surgical History:  Procedure Laterality Date  . ABDOMINAL HYSTERECTOMY    . BACK SURGERY      SOCIAL HISTORY: Social History   Socioeconomic History  . Marital status: Widowed    Spouse name: Not on file  . Number of children: 4  . Years of education: 74  . Highest education level: Not on file  Occupational History  . Occupation: Retired Museum/gallery curator at [employer]  . Smoking status: Former Smoker    Packs/day: 0.10    Years: 58.00    Pack years: 5.80    Types: Cigarettes  . Smokeless tobacco: Never Used  Vaping Use  . Vaping Use: Never used  Substance and Sexual Activity  . Alcohol use: No  . Drug use: No  . Sexual activity: Never  Other Topics Concern  . Not on file  Social History Narrative   Widowed. Lives alone.  Normally independent of ADLs and ambulation.   Social Determinants of Health   Financial Resource Strain: Not on file  Food Insecurity: Not on file  Transportation Needs: Not on file  Physical Activity: Not on file  Stress: Not on file  Social Connections: Not on file  Intimate Partner Violence: Not on file    FAMILY HISTORY: Family History  Problem Relation Age of Onset  . Heart failure Mother   . Diabetes Brother   . Cancer Neg Hx     ALLERGIES:  has No Known Allergies.  MEDICATIONS:  Current Outpatient Medications  Medication Sig Dispense Refill  . acetaminophen (TYLENOL) 500 MG tablet Take 500 mg by mouth every 6 (six) hours as needed for headache.    Marland Kitchen amLODipine (NORVASC) 2.5 MG tablet Take 2.5 mg by mouth daily.    Marland Kitchen atenolol (TENORMIN) 50 MG tablet Take 50 mg daily by mouth.     . cephALEXin (KEFLEX) 250 MG capsule Take 1 capsule (250 mg total) by mouth 3 (three) times daily. (Patient not taking: Reported on 10/17/2019) 21  capsule 0  . Cyanocobalamin (B-12) 1000 MCG SUBL Place 1,000 mcg under the tongue daily. 30 each 3  . dexamethasone (DECADRON) 2 MG tablet TAKE 1 TABLET BY MOUTH DAILY WITH BREAKFAST (Patient not taking: Reported on 10/17/2019) 30 tablet 0  . diclofenac sodium (VOLTAREN) 1 % GEL Apply 2 g topically daily as needed (pain).     Marland Kitchen estradiol (ESTRACE) 0.1 MG/GM vaginal cream Discard plastic applicator. Insert blueberry size amount of cream on finger in vagina daily x1 week then every other night.    . hydrocortisone 2.5 % lotion APPLY TO AREA OF ECZEMATOID RASH AND PRURITIS ON UPPER EXTREMITIES TWICE DAILY 59 mL 1  . iron polysaccharides (NIFEREX) 150 MG capsule Take 1 capsule (150 mg total) by mouth daily. (Patient not taking: Reported on 10/17/2019) 30 capsule 2  .  MYRBETRIQ 25 MG TB24 tablet     . oxybutynin (DITROPAN-XL) 10 MG 24 hr tablet     . phenazopyridine (PYRIDIUM) 100 MG tablet Take 1 tablet (100 mg total) by mouth 3 (three) times daily as needed for pain. 15 tablet 0  . potassium chloride SA (K-DUR,KLOR-CON) 20 MEQ tablet Take 1 tablet (20 mEq total) by mouth 2 (two) times daily for 10 days. (Patient not taking: Reported on 10/23/2017) 20 tablet 0  . rivaroxaban (XARELTO) 10 MG TABS tablet Take 10 mg by mouth daily.    Marland Kitchen warfarin (COUMADIN) 5 MG tablet Take 5-7.5 mg by mouth daily. Take 5 mg on Monday and Tuesday, all other days take 7.5 mg (Patient not taking: Reported on 10/17/2019)     No current facility-administered medications for this visit.    REVIEW OF SYSTEMS:   10 Point review of Systems was done is negative except as noted above.  PHYSICAL EXAMINATION:  ECOG FS:2 - Symptomatic, <50% confined to bed  There were no vitals filed for this visit. Wt Readings from Last 3 Encounters:  10/17/19 111 lb 8 oz (50.6 kg)  06/17/19 110 lb 14.4 oz (50.3 kg)  03/18/19 106 lb 11.2 oz (48.4 kg)   Body mass index is 17.77 kg/m.    Exam was given in a chair.   GENERAL:alert, in no  acute distress and comfortable SKIN: no acute rashes, no significant lesions EYES: conjunctiva are pink and non-injected, sclera anicteric OROPHARYNX: MMM, no exudates, no oropharyngeal erythema or ulceration NECK: supple, no JVD LYMPH:  no palpable lymphadenopathy in the cervical, axillary or inguinal regions LUNGS: clear to auscultation b/l with normal respiratory effort HEART: regular rate & rhythm ABDOMEN:  normoactive bowel sounds , non tender, not distended. Extremity: no pedal edema PSYCH: alert & oriented x 3 with fluent speech NEURO: no focal motor/sensory deficits   LABORATORY DATA:  I have reviewed the data as listed  CBC Latest Ref Rng & Units 04/18/2020 10/17/2019 06/17/2019  WBC 4.0 - 10.5 K/uL 4.6 6.1 8.4  Hemoglobin 12.0 - 15.0 g/dL 10.9(L) 11.2(L) 11.1(L)  Hematocrit 36.0 - 46.0 % 34.3(L) 35.8(L) 35.8(L)  Platelets 150 - 400 K/uL 133(L) 146(L) 119(L)    CBC    Component Value Date/Time   WBC 6.1 10/17/2019 1325   RBC 4.11 10/17/2019 1325   HGB 11.2 (L) 10/17/2019 1325   HGB 9.5 (L) 05/01/2017 0921   HGB 10.4 (L) 12/02/2016 0900   HCT 35.8 (L) 10/17/2019 1325   HCT 32.4 (L) 12/02/2016 0900   PLT 146 (L) 10/17/2019 1325   PLT 130 (L) 05/01/2017 0921   PLT 131 (L) 12/02/2016 0900   MCV 87.1 10/17/2019 1325   MCV 78.8 (L) 12/02/2016 0900   MCH 27.3 10/17/2019 1325   MCHC 31.3 10/17/2019 1325   RDW 15.1 10/17/2019 1325   RDW 19.1 (H) 12/02/2016 0900   LYMPHSABS 3.0 10/17/2019 1325   LYMPHSABS 10.6 (H) 12/02/2016 0900   MONOABS 0.5 10/17/2019 1325   MONOABS 1.0 (H) 12/02/2016 0900   EOSABS 0.2 10/17/2019 1325   EOSABS 0.2 12/02/2016 0900   BASOSABS 0.0 10/17/2019 1325   BASOSABS 0.0 12/02/2016 0900   CMP Latest Ref Rng & Units 10/17/2019 06/17/2019 03/18/2019  Glucose 70 - 99 mg/dL 91 87 80  BUN 8 - 23 mg/dL 12 6(L) 13  Creatinine 0.44 - 1.00 mg/dL 0.99 0.95 0.93  Sodium 135 - 145 mmol/L 140 146(H) 143  Potassium 3.5 - 5.1 mmol/L 3.2(L) 3.5 3.4(L)  Chloride 98 - 111 mmol/L 111 112(H) 109  CO2 22 - 32 mmol/L 22 24 23   Calcium 8.9 - 10.3 mg/dL 10.4(H) 9.5 10.2  Total Protein 6.5 - 8.1 g/dL 8.2(H) 8.1 9.6(H)  Total Bilirubin 0.3 - 1.2 mg/dL 0.6 0.5 0.6  Alkaline Phos 38 - 126 U/L 56 51 57  AST 15 - 41 U/L 11(L) 15 15  ALT 0 - 44 U/L 6 9 7    Lab Results  Component Value Date   IRON 39 (L) 06/17/2019   TIBC 228 (L) 06/17/2019   IRONPCTSAT 17 (L) 06/17/2019   (Iron and TIBC)    Lab Results  Component Value Date   FERRITIN 187 10/17/2019        RADIOGRAPHIC STUDIES: I have personally reviewed the radiological images as listed and agreed with the findings in the report.  PET/CT scan 08/18/2016 IMPRESSION: 1. Scattered primarily mild adenopathy most notable in the bilateral inguinal regions, but also with involvement of the left internal mammary chain, left axilla, retroperitoneum, gastrohepatic ligament, and of a pericardial lymph node. Questionable involvement of a small right station 2 lymph node and at the right axilla. Much of this is Deauville 4 disease, with some Deauville 3 and of L2 disease as noted above. 2. Faintly accentuated activity in the spleen without focal activity or overt splenomegaly. This may merit surveillance.  3. There is some Deauville 3 level activity associated with mild soft tissue prominence of the labia majora. This could be simply incidental. 4. Other imaging findings of potential clinical significance: Aortic Atherosclerosis (ICD10-I70.0). Coronary atherosclerosis. Chronic left occipital lobe encephalomalacia. Hepatic cysts and potential scarring in the right hepatic lobe. Pancreas divisum. Chronic degenerative anterolisthesis at L4-5. Bony demineralization.   Electronically Signed   By: Van Clines M.D.   On: 08/18/2016 13:09   ASSESSMENT & PLAN:   87 y.o. female with multiple medical comorbidities with  #1 Low grade Non Hodgkins B cell lymphoma NOS  Flow cytometry  suggestive of Cd20+ CD5-CD 10 neg Low grade Non Hodgkins lymphoma. Concern for possible CD5 neg chronic lymphocytic leukemia versus low grade non-Hodgkin's lymphoma NOS  Patient did have some borderline abdominal and inguinal lymphadenopathy on her CT abdomen pelvis in November 2017.  PET/CT 6/25 showed Scattered primarily mild adenopathy most notable in the bilateral inguinal regions, but also with involvement of the left internal mammary chain, left axilla, retroperitoneum, gastrohepatic ligament, and of a pericardial lymph node. Questionable involvement of a small right station 2 lymph node and at the right axilla. Much of this is Deauville 4 disease, with some Deauville 3 and of L2 disease as noted above. 2. Faintly accentuated activity in the spleen without focal activity or overt splenomegaly.  CLL FISH Prognostic panel -- did not show any mutation typical for CLL. Less likely CD5 neg CLL US guided Bx of inguinal LN was done- however only limited lymphoid tissue was obtained and the biopsy is nondiagnostic. Patient had previously decided to hold off  On an excisional lymph node biopsy  #2 Anemia  - microcytic anemia with some element to iron deficiency.  Iron /ferritin levels much improved after IV iron . Lab Results  Component Value Date   IRON 39 (L) 06/17/2019   TIBC 228 (L) 06/17/2019   IRONPCTSAT 17 (L) 06/17/2019   (Iron and TIBC)  Lab Results  Component Value Date   FERRITIN 187 10/17/2019    #3 B12 deficiency. B12 levels are coming up from 116 now up to 295 to 722  with oral B12 replacement. No evidence of pernicious anemia based on neg antibody testing. B12 - 705  #4 Mild thrombocytopenia-  could be related to her NHL -Will continue to monitor.  #5 Weight Loss Wt Readings from Last 3 Encounters:  10/17/19 111 lb 8 oz (50.6 kg)  06/17/19 110 lb 14.4 oz (50.3 kg)  03/18/19 106 lb 11.2 oz (48.4 kg)   PLAN: -Discussed pt labwork today, 04/18/2020; WBC normal, Plt  normal, Hgb borderline low but stable. LDH improved. Blood chemistries stable. Ferritin at 183. -Advised pt we can reduce Iron Polysaccharide pending Ferritin labs today to see if this helps with black stools. -Advised pt that underlying Lymphoma does not seem to be the driver for fatigue or worsening symptoms in the pt.  -Will continue to monitor symptoms and labs every 6 months. The pt is aware to contact if acute symptoms or concerns arise. -Advised pt that given her age and current medical status, the treatment would be more burdensome than monitoring as we are doing now. The Lymphoma is behaving indolently at this time. -Recommended pt continue to eat as much as she can. -No lab or clinical evidence of B-cell Lymphoma progression at this time.  -Will see back in 6 months with labs.   FOLLOW UP: RTC with Dr Irene Limbo with labs in 6 months   The total time spent in the appointment was 20 minutes and more than 50% was on counseling and direct patient cares.   All of the patient's questions were answered with apparent satisfaction. The patient knows to call the clinic with any problems, questions or concerns.   Sullivan Lone MD [place] AAHIVMS [facility] Hematology/Oncology Physician [facility]  (Office):       [phone] (Work cell):  [phone] (Fax):           [phone]  I, Reinaldo Raddle, am acting as scribe for Dr. Sullivan Lone, MD.      .I have reviewed the above documentation for accuracy and completeness, and I agree with the above. Brunetta Genera MD

## 2020-04-18 ENCOUNTER — Inpatient Hospital Stay: Payer: Medicare HMO | Attending: Hematology | Admitting: Hematology

## 2020-04-18 ENCOUNTER — Inpatient Hospital Stay: Payer: Medicare HMO

## 2020-04-18 ENCOUNTER — Other Ambulatory Visit: Payer: Self-pay

## 2020-04-18 VITALS — BP 153/74 | HR 66 | Temp 97.8°F | Resp 18 | Ht 66.0 in | Wt 110.1 lb

## 2020-04-18 DIAGNOSIS — Z90710 Acquired absence of both cervix and uterus: Secondary | ICD-10-CM | POA: Diagnosis not present

## 2020-04-18 DIAGNOSIS — D649 Anemia, unspecified: Secondary | ICD-10-CM

## 2020-04-18 DIAGNOSIS — R5383 Other fatigue: Secondary | ICD-10-CM | POA: Diagnosis not present

## 2020-04-18 DIAGNOSIS — E538 Deficiency of other specified B group vitamins: Secondary | ICD-10-CM | POA: Diagnosis not present

## 2020-04-18 DIAGNOSIS — D509 Iron deficiency anemia, unspecified: Secondary | ICD-10-CM | POA: Diagnosis not present

## 2020-04-18 DIAGNOSIS — I10 Essential (primary) hypertension: Secondary | ICD-10-CM | POA: Diagnosis not present

## 2020-04-18 DIAGNOSIS — I480 Paroxysmal atrial fibrillation: Secondary | ICD-10-CM | POA: Diagnosis not present

## 2020-04-18 DIAGNOSIS — C8308 Small cell B-cell lymphoma, lymph nodes of multiple sites: Secondary | ICD-10-CM | POA: Diagnosis not present

## 2020-04-18 DIAGNOSIS — C8590 Non-Hodgkin lymphoma, unspecified, unspecified site: Secondary | ICD-10-CM | POA: Insufficient documentation

## 2020-04-18 DIAGNOSIS — R634 Abnormal weight loss: Secondary | ICD-10-CM | POA: Diagnosis not present

## 2020-04-18 DIAGNOSIS — Z87891 Personal history of nicotine dependence: Secondary | ICD-10-CM | POA: Diagnosis not present

## 2020-04-18 DIAGNOSIS — D696 Thrombocytopenia, unspecified: Secondary | ICD-10-CM | POA: Diagnosis not present

## 2020-04-18 LAB — CMP (CANCER CENTER ONLY)
ALT: 9 U/L (ref 0–44)
AST: 15 U/L (ref 15–41)
Albumin: 3.4 g/dL — ABNORMAL LOW (ref 3.5–5.0)
Alkaline Phosphatase: 55 U/L (ref 38–126)
Anion gap: 5 (ref 5–15)
BUN: 11 mg/dL (ref 8–23)
CO2: 26 mmol/L (ref 22–32)
Calcium: 9.7 mg/dL (ref 8.9–10.3)
Chloride: 109 mmol/L (ref 98–111)
Creatinine: 1.02 mg/dL — ABNORMAL HIGH (ref 0.44–1.00)
GFR, Estimated: 53 mL/min — ABNORMAL LOW (ref >60)
Glucose, Bld: 90 mg/dL (ref 70–99)
Potassium: 3.8 mmol/L (ref 3.5–5.1)
Sodium: 140 mmol/L (ref 135–145)
Total Bilirubin: 0.4 mg/dL (ref 0.3–1.2)
Total Protein: 8.1 g/dL (ref 6.5–8.1)

## 2020-04-18 LAB — CBC WITH DIFFERENTIAL/PLATELET
Abs Immature Granulocytes: 0 10*3/uL (ref 0.00–0.07)
Basophils Absolute: 0 10*3/uL (ref 0.0–0.1)
Basophils Relative: 0 %
Eosinophils Absolute: 0.2 10*3/uL (ref 0.0–0.5)
Eosinophils Relative: 4 %
HCT: 34.3 % — ABNORMAL LOW (ref 36.0–46.0)
Hemoglobin: 10.9 g/dL — ABNORMAL LOW (ref 12.0–15.0)
Immature Granulocytes: 0 %
Lymphocytes Relative: 39 %
Lymphs Abs: 1.8 10*3/uL (ref 0.7–4.0)
MCH: 27.9 pg (ref 26.0–34.0)
MCHC: 31.8 g/dL (ref 30.0–36.0)
MCV: 87.7 fL (ref 80.0–100.0)
Monocytes Absolute: 0.5 10*3/uL (ref 0.1–1.0)
Monocytes Relative: 10 %
Neutro Abs: 2.1 10*3/uL (ref 1.7–7.7)
Neutrophils Relative %: 47 %
Platelets: 133 10*3/uL — ABNORMAL LOW (ref 150–400)
RBC: 3.91 MIL/uL (ref 3.87–5.11)
RDW: 15.7 % — ABNORMAL HIGH (ref 11.5–15.5)
WBC: 4.6 10*3/uL (ref 4.0–10.5)
nRBC: 0 % (ref 0.0–0.2)

## 2020-04-18 LAB — LACTATE DEHYDROGENASE: LDH: 141 U/L (ref 98–192)

## 2020-04-18 LAB — FERRITIN: Ferritin: 183 ng/mL (ref 11–307)

## 2020-06-18 DIAGNOSIS — R3915 Urgency of urination: Secondary | ICD-10-CM | POA: Diagnosis not present

## 2020-06-28 DIAGNOSIS — C8308 Small cell B-cell lymphoma, lymph nodes of multiple sites: Secondary | ICD-10-CM | POA: Diagnosis not present

## 2020-06-28 DIAGNOSIS — N3000 Acute cystitis without hematuria: Secondary | ICD-10-CM | POA: Diagnosis not present

## 2020-06-28 DIAGNOSIS — N3281 Overactive bladder: Secondary | ICD-10-CM | POA: Diagnosis not present

## 2020-06-28 DIAGNOSIS — E785 Hyperlipidemia, unspecified: Secondary | ICD-10-CM | POA: Diagnosis not present

## 2020-06-28 DIAGNOSIS — I10 Essential (primary) hypertension: Secondary | ICD-10-CM | POA: Diagnosis not present

## 2020-06-28 DIAGNOSIS — R300 Dysuria: Secondary | ICD-10-CM | POA: Diagnosis not present

## 2020-07-19 DIAGNOSIS — N3946 Mixed incontinence: Secondary | ICD-10-CM | POA: Diagnosis not present

## 2020-07-19 DIAGNOSIS — R3915 Urgency of urination: Secondary | ICD-10-CM | POA: Diagnosis not present

## 2020-09-20 DIAGNOSIS — R300 Dysuria: Secondary | ICD-10-CM | POA: Diagnosis not present

## 2020-09-20 DIAGNOSIS — N3001 Acute cystitis with hematuria: Secondary | ICD-10-CM | POA: Diagnosis not present

## 2020-10-16 ENCOUNTER — Other Ambulatory Visit: Payer: Self-pay

## 2020-10-16 ENCOUNTER — Inpatient Hospital Stay: Payer: Medicare HMO | Admitting: Hematology

## 2020-10-16 ENCOUNTER — Inpatient Hospital Stay: Payer: Medicare HMO | Attending: Hematology

## 2020-10-16 VITALS — BP 152/82 | HR 69 | Temp 99.0°F | Resp 18 | Wt 105.4 lb

## 2020-10-16 DIAGNOSIS — C8308 Small cell B-cell lymphoma, lymph nodes of multiple sites: Secondary | ICD-10-CM

## 2020-10-16 DIAGNOSIS — D509 Iron deficiency anemia, unspecified: Secondary | ICD-10-CM | POA: Insufficient documentation

## 2020-10-16 DIAGNOSIS — E538 Deficiency of other specified B group vitamins: Secondary | ICD-10-CM | POA: Diagnosis not present

## 2020-10-16 DIAGNOSIS — Z87891 Personal history of nicotine dependence: Secondary | ICD-10-CM | POA: Insufficient documentation

## 2020-10-16 DIAGNOSIS — I480 Paroxysmal atrial fibrillation: Secondary | ICD-10-CM | POA: Diagnosis not present

## 2020-10-16 DIAGNOSIS — I10 Essential (primary) hypertension: Secondary | ICD-10-CM | POA: Diagnosis not present

## 2020-10-16 DIAGNOSIS — E785 Hyperlipidemia, unspecified: Secondary | ICD-10-CM | POA: Diagnosis not present

## 2020-10-16 DIAGNOSIS — D696 Thrombocytopenia, unspecified: Secondary | ICD-10-CM | POA: Insufficient documentation

## 2020-10-16 DIAGNOSIS — K219 Gastro-esophageal reflux disease without esophagitis: Secondary | ICD-10-CM | POA: Diagnosis not present

## 2020-10-16 DIAGNOSIS — Z90710 Acquired absence of both cervix and uterus: Secondary | ICD-10-CM | POA: Diagnosis not present

## 2020-10-16 DIAGNOSIS — Z8572 Personal history of non-Hodgkin lymphomas: Secondary | ICD-10-CM | POA: Insufficient documentation

## 2020-10-16 DIAGNOSIS — Z86711 Personal history of pulmonary embolism: Secondary | ICD-10-CM | POA: Diagnosis not present

## 2020-10-16 LAB — CMP (CANCER CENTER ONLY)
ALT: 8 U/L (ref 0–44)
AST: 15 U/L (ref 15–41)
Albumin: 3.5 g/dL (ref 3.5–5.0)
Alkaline Phosphatase: 60 U/L (ref 38–126)
Anion gap: 8 (ref 5–15)
BUN: 11 mg/dL (ref 8–23)
CO2: 26 mmol/L (ref 22–32)
Calcium: 10.2 mg/dL (ref 8.9–10.3)
Chloride: 107 mmol/L (ref 98–111)
Creatinine: 1.02 mg/dL — ABNORMAL HIGH (ref 0.44–1.00)
GFR, Estimated: 53 mL/min — ABNORMAL LOW (ref >60)
Glucose, Bld: 88 mg/dL (ref 70–99)
Potassium: 4.3 mmol/L (ref 3.5–5.1)
Sodium: 141 mmol/L (ref 135–145)
Total Bilirubin: 0.7 mg/dL (ref 0.3–1.2)
Total Protein: 8 g/dL (ref 6.5–8.1)

## 2020-10-16 LAB — CBC WITH DIFFERENTIAL (CANCER CENTER ONLY)
Abs Immature Granulocytes: 0.01 10*3/uL (ref 0.00–0.07)
Basophils Absolute: 0 10*3/uL (ref 0.0–0.1)
Basophils Relative: 1 %
Eosinophils Absolute: 0.1 10*3/uL (ref 0.0–0.5)
Eosinophils Relative: 2 %
HCT: 36.8 % (ref 36.0–46.0)
Hemoglobin: 11.7 g/dL — ABNORMAL LOW (ref 12.0–15.0)
Immature Granulocytes: 0 %
Lymphocytes Relative: 20 %
Lymphs Abs: 1.1 10*3/uL (ref 0.7–4.0)
MCH: 27.1 pg (ref 26.0–34.0)
MCHC: 31.8 g/dL (ref 30.0–36.0)
MCV: 85.4 fL (ref 80.0–100.0)
Monocytes Absolute: 0.4 10*3/uL (ref 0.1–1.0)
Monocytes Relative: 7 %
Neutro Abs: 4 10*3/uL (ref 1.7–7.7)
Neutrophils Relative %: 70 %
Platelet Count: 146 10*3/uL — ABNORMAL LOW (ref 150–400)
RBC: 4.31 MIL/uL (ref 3.87–5.11)
RDW: 14.6 % (ref 11.5–15.5)
WBC Count: 5.6 10*3/uL (ref 4.0–10.5)
nRBC: 0 % (ref 0.0–0.2)

## 2020-10-16 LAB — LACTATE DEHYDROGENASE: LDH: 176 U/L (ref 98–192)

## 2020-10-17 LAB — FERRITIN: Ferritin: 150 ng/mL (ref 11–307)

## 2020-10-22 ENCOUNTER — Encounter: Payer: Self-pay | Admitting: Hematology

## 2020-10-22 NOTE — Progress Notes (Signed)
HEMATOLOGY/ONCOLOGY CLINIC NOTE  Date of Service: 10/16/2020  Patient Care Team: Carol Ada, MD as PCP - General (Family Medicine)  CHIEF COMPLAINTS/PURPOSE OF CONSULTATION:   F/u for continue mx of low grade NHL Iron def Anemia  HISTORY OF PRESENTING ILLNESS:   Sara Butler is a wonderful 87 y.o. female who has been referred to [place] by Dr .Carol Ada, MD  for evaluation and management of increasing lymphocytosis, anemia and thrombocytopenia.  Patient has a history of hypertension, dyslipidemia, paroxysmal atrial fibrillation on Coumadin, previous history of pulmonary embolism in 2013, history of iron deficiency due to chronic GI losses on oral iron therapy, GERD.  Patient had recent labs with her primary care physician on 05/01/2016 which showed total WBC count of 10.2k with 6.6k lymphocytes, mild anemia with hemoglobin of 11.4 and somewhat microcytic in dialysis with an MCV of 80 and an RDW of 19. She was also noted to have mild thrombocytopenia with a platelet count of 143k.  Patient had workup including getting TSH which is within normal limits. Noted to have significant B12 deficiency with a B12 level of 116. Has been started on oral B12 5000 g daily by her primary care physician. Recommended considering sublingual B12 replacement. Would need to rule out pernicious anemia.   Patient notes that she has been on oral iron replacement on and off.  She reports issues with what is thought to be irritable bowel syndrome for which she is on Myrbetriq.  Patient notes that she had some abdominal discomfort and had a CT of the abdomen and pelvis on 01/01/2016 which showed Prominent bilateral inguinal as well as gastrohepatic lymphadenopathy. These are new from the prior exam of 2014 in the gastrohepatic changes appear to be new from the recent exam from 06/20/2015.  Patient notes no fevers no chills no night sweats no significant unexpected weight loss. She denies any overt GI  bleeding. No other issues with overt nosebleeds gum bleeds or other sources of bleeding  INTERVAL HISTORY:     Sara Butler is her for f/u on her lymphoproliferative disorder -low grade NHL NOS. The patient's last visit with [place] was on 04/19/2019. The pt reports that she is doing well overall. We are joined today by her granddaughter.   The pt reports no new symptoms or concerns.  Notes age-appropriate fatigue and decreased activity level.  No fevers no chills no night sweats no sudden unexpected weight loss.  No new significant lumps or bumps.  No lightheadedness or dizziness.  No shortness of breath or chest pain.  No abdominal pain or distention.  Lab results today 10/16/2020 of CBC w/diff-hemoglobin improved from 10.9-11.7.  WBC count within normal limits at 5.6k platelets are nearly normal at 146k  and CMP is unremarkable Ferritin is stable and adequate ferritin at 150 LDH is within normal limits at 176  On review of systems, pt reports black stools, fatigue and denies fevers, chills, night sweats, new lumps/bumps, unexpected weight loss, decreased appetite, abdominal pain or distention and any other symptoms.  MEDICAL HISTORY:  Past Medical History:  Diagnosis Date   Atrial fibrillation ([place])    Bleeding from anus    Cervical cancer ([place])    Diverticulitis of colon    GERD (gastroesophageal reflux disease)    Gout    Hyperlipidemia    Hypertension    Pulmonary embolism ([facility])   Iron deficiency anemia due to chronic blood loss Paroxysmal atrial fibrillation on Coumadin Diverticulosis of the large  and this time Vitamin D deficiency Previous history of pulmonary embolism in 2013 Overactive bladder Osteoarthritis of the knee Chronic back pain History of previous GI bleed in 2011 (while on pradaxa)  SURGICAL HISTORY: Past Surgical History:  Procedure Laterality Date   ABDOMINAL HYSTERECTOMY     BACK SURGERY      SOCIAL HISTORY: Social History   Socioeconomic History    Marital status: Widowed    Spouse name: Not on file   Number of children: 4   Years of education: 37   Highest education level: Not on file  Occupational History   Occupation: Retired Museum/gallery curator at [employer] Use   Smoking status: Former    Packs/day: 0.10    Years: 58.00    Pack years: 5.80    Types: Cigarettes   Smokeless tobacco: Never  Vaping Use   Vaping Use: Never used  Substance and Sexual Activity   Alcohol use: No   Drug use: No   Sexual activity: Never  Other Topics Concern   Not on file  Social History Narrative   Widowed. Lives alone.  Normally independent of ADLs and ambulation.   Social Determinants of Health   Financial Resource Strain: Not on file  Food Insecurity: Not on file  Transportation Needs: Not on file  Physical Activity: Not on file  Stress: Not on file  Social Connections: Not on file  Intimate Partner Violence: Not on file    FAMILY HISTORY: Family History  Problem Relation Age of Onset   Heart failure Mother    Diabetes Brother    Cancer Neg Hx     ALLERGIES:  has No Known Allergies.  MEDICATIONS:  Current Outpatient Medications  Medication Sig Dispense Refill   acetaminophen (TYLENOL) 500 MG tablet Take 500 mg by mouth every 6 (six) hours as needed for headache.     amLODipine (NORVASC) 2.5 MG tablet Take 2.5 mg by mouth daily.     atenolol (TENORMIN) 50 MG tablet Take 50 mg daily by mouth.      Cyanocobalamin (B-12) 1000 MCG SUBL Place 1,000 mcg under the tongue daily. 30 each 3   diclofenac sodium (VOLTAREN) 1 % GEL Apply 2 g topically daily as needed (pain).      estradiol (ESTRACE) 0.1 MG/GM vaginal cream Discard plastic applicator. Insert blueberry size amount of cream on finger in vagina daily x1 week then every other night.     hydrocortisone 2.5 % lotion APPLY TO AREA OF ECZEMATOID RASH AND PRURITIS ON UPPER EXTREMITIES TWICE DAILY 59 mL 1   iron polysaccharides (NIFEREX) 150 MG capsule Take 1 capsule (150 mg total) by  mouth daily. 30 capsule 2   MYRBETRIQ 25 MG TB24 tablet      oxybutynin (DITROPAN-XL) 10 MG 24 hr tablet      potassium chloride SA (K-DUR,KLOR-CON) 20 MEQ tablet Take 1 tablet (20 mEq total) by mouth 2 (two) times daily for 10 days. 20 tablet 0   rivaroxaban (XARELTO) 10 MG TABS tablet Take 10 mg by mouth daily.     cephALEXin (KEFLEX) 250 MG capsule Take 1 capsule (250 mg total) by mouth 3 (three) times daily. (Patient not taking: No sig reported) 21 capsule 0   dexamethasone (DECADRON) 2 MG tablet TAKE 1 TABLET BY MOUTH DAILY WITH BREAKFAST 30 tablet 0   phenazopyridine (PYRIDIUM) 100 MG tablet Take 1 tablet (100 mg total) by mouth 3 (three) times daily as needed for pain. (Patient not taking: Reported on 10/16/2020)  15 tablet 0   rivaroxaban (XARELTO) 10 MG TABS tablet Take by mouth.     warfarin (COUMADIN) 5 MG tablet Take 5-7.5 mg by mouth daily. Take 5 mg on Monday and Tuesday, all other days take 7.5 mg (Patient not taking: No sig reported)     No current facility-administered medications for this visit.    REVIEW OF SYSTEMS:   .10 Point review of Systems was done is negative except as noted above.   PHYSICAL EXAMINATION:  ECOG FS:2 - Symptomatic, <50% confined to bed  Vitals:   10/16/20 1410  BP: (!) 152/82  Pulse: 69  Resp: 18  Temp: 99 F (37.2 C)  SpO2: 96%   Wt Readings from Last 3 Encounters:  10/16/20 105 lb 6 oz (47.8 kg)  04/18/20 110 lb 1.6 oz (49.9 kg)  10/17/19 111 lb 8 oz (50.6 kg)   Body mass index is 17.01 kg/m.    NAD . GENERAL:alert, in no acute distress and comfortable SKIN: no acute rashes, no significant lesions EYES: conjunctiva are pink and non-injected, sclera anicteric OROPHARYNX: MMM, no exudates, no oropharyngeal erythema or ulceration NECK: supple, no JVD LYMPH:  no palpable lymphadenopathy in the cervical, axillary or inguinal regions LUNGS: clear to auscultation b/l with normal respiratory effort HEART: regular rate &  rhythm ABDOMEN:  normoactive bowel sounds , non tender, not distended. Extremity: no pedal edema PSYCH: alert & oriented x 3 with fluent speech NEURO: no focal motor/sensory deficits  LABORATORY DATA:  I have reviewed the data as listed  CBC Latest Ref Rng & Units 10/16/2020 04/18/2020 10/17/2019  WBC 4.0 - 10.5 K/uL 5.6 4.6 6.1  Hemoglobin 12.0 - 15.0 g/dL 11.7(L) 10.9(L) 11.2(L)  Hematocrit 36.0 - 46.0 % 36.8 34.3(L) 35.8(L)  Platelets 150 - 400 K/uL 146(L) 133(L) 146(L)    CBC    Component Value Date/Time   WBC 5.6 10/16/2020 1352   WBC 4.6 04/18/2020 1327   RBC 4.31 10/16/2020 1352   HGB 11.7 (L) 10/16/2020 1352   HGB 10.4 (L) 12/02/2016 0900   HCT 36.8 10/16/2020 1352   HCT 32.4 (L) 12/02/2016 0900   PLT 146 (L) 10/16/2020 1352   PLT 131 (L) 12/02/2016 0900   MCV 85.4 10/16/2020 1352   MCV 78.8 (L) 12/02/2016 0900   MCH 27.1 10/16/2020 1352   MCHC 31.8 10/16/2020 1352   RDW 14.6 10/16/2020 1352   RDW 19.1 (H) 12/02/2016 0900   LYMPHSABS 1.1 10/16/2020 1352   LYMPHSABS 10.6 (H) 12/02/2016 0900   MONOABS 0.4 10/16/2020 1352   MONOABS 1.0 (H) 12/02/2016 0900   EOSABS 0.1 10/16/2020 1352   EOSABS 0.2 12/02/2016 0900   BASOSABS 0.0 10/16/2020 1352   BASOSABS 0.0 12/02/2016 0900   CMP Latest Ref Rng & Units 10/16/2020 04/18/2020 10/17/2019  Glucose 70 - 99 mg/dL 88 90 91  BUN 8 - 23 mg/dL '11 11 12  '$ Creatinine 0.44 - 1.00 mg/dL 1.02(H) 1.02(H) 0.99  Sodium 135 - 145 mmol/L 141 140 140  Potassium 3.5 - 5.1 mmol/L 4.3 3.8 3.2(L)  Chloride 98 - 111 mmol/L 107 109 111  CO2 22 - 32 mmol/L '26 26 22  '$ Calcium 8.9 - 10.3 mg/dL 10.2 9.7 10.4(H)  Total Protein 6.5 - 8.1 g/dL 8.0 8.1 8.2(H)  Total Bilirubin 0.3 - 1.2 mg/dL 0.7 0.4 0.6  Alkaline Phos 38 - 126 U/L 60 55 56  AST 15 - 41 U/L 15 15 11(L)  ALT 0 - 44 U/L 8 9 <6   .  Lab Results  Component Value Date   LDH 176 10/16/2020     Lab Results  Component Value Date   FERRITIN 150 10/16/2020        RADIOGRAPHIC  STUDIES: I have personally reviewed the radiological images as listed and agreed with the findings in the report.  PET/CT scan 08/18/2016 IMPRESSION: 1. Scattered primarily mild adenopathy most notable in the bilateral inguinal regions, but also with involvement of the left internal mammary chain, left axilla, retroperitoneum, gastrohepatic ligament, and of a pericardial lymph node. Questionable involvement of a small right station 2 lymph node and at the right axilla. Much of this is Deauville 4 disease, with some Deauville 3 and of L2 disease as noted above. 2. Faintly accentuated activity in the spleen without focal activity or overt splenomegaly. This may merit surveillance.  3. There is some Deauville 3 level activity associated with mild soft tissue prominence of the labia majora. This could be simply incidental. 4. Other imaging findings of potential clinical significance: Aortic Atherosclerosis (ICD10-I70.0). Coronary atherosclerosis. Chronic left occipital lobe encephalomalacia. Hepatic cysts and potential scarring in the right hepatic lobe. Pancreas divisum. Chronic degenerative anterolisthesis at L4-5. Bony demineralization.     Electronically Signed   By: Van Clines M.D.   On: 08/18/2016 13:09    ASSESSMENT & PLAN:   87 y.o. female with multiple medical comorbidities with  #1 Low grade Non Hodgkins B cell lymphoma NOS  Flow cytometry suggestive of Cd20+ CD5-CD 10 neg Low grade Non Hodgkins lymphoma. Concern for possible CD5 neg chronic lymphocytic leukemia versus low grade non-Hodgkin's lymphoma NOS  Patient did have some borderline abdominal and inguinal lymphadenopathy on her CT abdomen pelvis in November 2017.  PET/CT 6/25 showed Scattered primarily mild adenopathy most notable in the bilateral inguinal regions, but also with involvement of the left internal mammary chain, left axilla, retroperitoneum, gastrohepatic ligament, and of a pericardial lymph node.  Questionable involvement of a small right station 2 lymph node and at the right axilla. Much of this is Deauville 4 disease, with some Deauville 3 and of L2 disease as noted above. 2. Faintly accentuated activity in the spleen without focal activity or overt splenomegaly.  CLL FISH Prognostic panel -- did not show any mutation typical for CLL. Less likely CD5 neg CLL US guided Bx of inguinal LN was done- however only limited lymphoid tissue was obtained and the biopsy is nondiagnostic. Patient had previously decided to hold off  On an excisional lymph node biopsy  #2 Anemia  - microcytic anemia with some element to iron deficiency.  Iron /ferritin levels much improved after IV iron . Lab Results  Component Value Date   FERRITIN 150 10/16/2020    #3 B12 deficiency. B12 levels are coming up from 116 now up to 295 to 722 with oral B12 replacement. No evidence of pernicious anemia based on neg antibody testing. B12 - 705  #4 Mild thrombocytopenia-  could be related to her NHL -Will continue to monitor.  #5 Weight Loss Wt Readings from Last 3 Encounters:  10/16/20 105 lb 6 oz (47.8 kg)  04/18/20 110 lb 1.6 oz (49.9 kg)  10/17/19 111 lb 8 oz (50.6 kg)   PLAN:  Lab results today 10/16/2020 of CBC w/diff-hemoglobin improved from 10.9-11.7.  WBC count within normal limits at 5.6k platelets are nearly normal at 146k  and CMP is unremarkable Ferritin is stable and adequate ferritin at 150 -no indication for IV iron at this time. LDH is within normal  limits at 176 -Advised pt we can reduce Iron Polysaccharide 2-3 times weekly -Will continue to monitor symptoms and labs every 6 months. The pt is aware to contact if acute symptoms or concerns arise. -Recommended pt continue to eat as much as she can. -No lab or clinical evidence of B-cell Lymphoma progression at this time.  -Will see back in 6 months with labs.   FOLLOW UP: RTC with Dr Irene Limbo with labs in 6 months  . The total time spent in  the appointment was 20 minutes and more than 50% was on counseling and direct patient cares.    All of the patient's questions were answered with apparent satisfaction. The patient knows to call the clinic with any problems, questions or concerns.   Sullivan Lone MD [place] [facility] Hematology/Oncology Physician [facility]  (Office):       [phone] (Work cell):  [phone] (Fax):           [phone]

## 2021-01-07 DIAGNOSIS — Z0000 Encounter for general adult medical examination without abnormal findings: Secondary | ICD-10-CM | POA: Diagnosis not present

## 2021-01-07 DIAGNOSIS — Z1389 Encounter for screening for other disorder: Secondary | ICD-10-CM | POA: Diagnosis not present

## 2021-01-07 DIAGNOSIS — C8308 Small cell B-cell lymphoma, lymph nodes of multiple sites: Secondary | ICD-10-CM | POA: Diagnosis not present

## 2021-01-07 DIAGNOSIS — M109 Gout, unspecified: Secondary | ICD-10-CM | POA: Diagnosis not present

## 2021-01-07 DIAGNOSIS — N3281 Overactive bladder: Secondary | ICD-10-CM | POA: Diagnosis not present

## 2021-01-07 DIAGNOSIS — I10 Essential (primary) hypertension: Secondary | ICD-10-CM | POA: Diagnosis not present

## 2021-01-07 DIAGNOSIS — E7800 Pure hypercholesterolemia, unspecified: Secondary | ICD-10-CM | POA: Diagnosis not present

## 2021-01-07 DIAGNOSIS — K219 Gastro-esophageal reflux disease without esophagitis: Secondary | ICD-10-CM | POA: Diagnosis not present

## 2021-01-07 DIAGNOSIS — I480 Paroxysmal atrial fibrillation: Secondary | ICD-10-CM | POA: Diagnosis not present

## 2021-04-19 ENCOUNTER — Other Ambulatory Visit: Payer: Self-pay

## 2021-04-19 DIAGNOSIS — C8308 Small cell B-cell lymphoma, lymph nodes of multiple sites: Secondary | ICD-10-CM

## 2021-04-22 ENCOUNTER — Inpatient Hospital Stay: Payer: Medicare HMO | Attending: Hematology | Admitting: Hematology

## 2021-04-22 ENCOUNTER — Other Ambulatory Visit: Payer: Self-pay

## 2021-04-22 ENCOUNTER — Inpatient Hospital Stay: Payer: Medicare HMO

## 2021-04-22 VITALS — BP 160/90 | HR 67 | Temp 97.8°F | Resp 18 | Ht 66.0 in | Wt 104.2 lb

## 2021-04-22 DIAGNOSIS — Z8572 Personal history of non-Hodgkin lymphomas: Secondary | ICD-10-CM | POA: Insufficient documentation

## 2021-04-22 DIAGNOSIS — C8308 Small cell B-cell lymphoma, lymph nodes of multiple sites: Secondary | ICD-10-CM

## 2021-04-22 DIAGNOSIS — D649 Anemia, unspecified: Secondary | ICD-10-CM | POA: Diagnosis not present

## 2021-04-22 DIAGNOSIS — E538 Deficiency of other specified B group vitamins: Secondary | ICD-10-CM | POA: Insufficient documentation

## 2021-04-22 DIAGNOSIS — Z8541 Personal history of malignant neoplasm of cervix uteri: Secondary | ICD-10-CM | POA: Diagnosis not present

## 2021-04-22 DIAGNOSIS — Z87891 Personal history of nicotine dependence: Secondary | ICD-10-CM | POA: Insufficient documentation

## 2021-04-22 DIAGNOSIS — D696 Thrombocytopenia, unspecified: Secondary | ICD-10-CM | POA: Diagnosis not present

## 2021-04-22 LAB — CBC WITH DIFFERENTIAL (CANCER CENTER ONLY)
Abs Immature Granulocytes: 0 10*3/uL (ref 0.00–0.07)
Basophils Absolute: 0 10*3/uL (ref 0.0–0.1)
Basophils Relative: 1 %
Eosinophils Absolute: 0.1 10*3/uL (ref 0.0–0.5)
Eosinophils Relative: 3 %
HCT: 36 % (ref 36.0–46.0)
Hemoglobin: 11.4 g/dL — ABNORMAL LOW (ref 12.0–15.0)
Immature Granulocytes: 0 %
Lymphocytes Relative: 26 %
Lymphs Abs: 1.2 10*3/uL (ref 0.7–4.0)
MCH: 26.7 pg (ref 26.0–34.0)
MCHC: 31.7 g/dL (ref 30.0–36.0)
MCV: 84.3 fL (ref 80.0–100.0)
Monocytes Absolute: 0.4 10*3/uL (ref 0.1–1.0)
Monocytes Relative: 9 %
Neutro Abs: 2.8 10*3/uL (ref 1.7–7.7)
Neutrophils Relative %: 61 %
Platelet Count: 186 10*3/uL (ref 150–400)
RBC: 4.27 MIL/uL (ref 3.87–5.11)
RDW: 15.1 % (ref 11.5–15.5)
WBC Count: 4.6 10*3/uL (ref 4.0–10.5)
nRBC: 0 % (ref 0.0–0.2)

## 2021-04-22 LAB — CMP (CANCER CENTER ONLY)
ALT: 11 U/L (ref 0–44)
AST: 12 U/L — ABNORMAL LOW (ref 15–41)
Albumin: 3.8 g/dL (ref 3.5–5.0)
Alkaline Phosphatase: 59 U/L (ref 38–126)
Anion gap: 5 (ref 5–15)
BUN: 20 mg/dL (ref 8–23)
CO2: 24 mmol/L (ref 22–32)
Calcium: 10.7 mg/dL — ABNORMAL HIGH (ref 8.9–10.3)
Chloride: 112 mmol/L — ABNORMAL HIGH (ref 98–111)
Creatinine: 1.1 mg/dL — ABNORMAL HIGH (ref 0.44–1.00)
GFR, Estimated: 48 mL/min — ABNORMAL LOW (ref >60)
Glucose, Bld: 88 mg/dL (ref 70–99)
Potassium: 3.7 mmol/L (ref 3.5–5.1)
Sodium: 141 mmol/L (ref 135–145)
Total Bilirubin: 0.6 mg/dL (ref 0.3–1.2)
Total Protein: 8.3 g/dL — ABNORMAL HIGH (ref 6.5–8.1)

## 2021-04-22 LAB — LACTATE DEHYDROGENASE: LDH: 121 U/L (ref 98–192)

## 2021-04-22 LAB — FERRITIN: Ferritin: 199 ng/mL (ref 11–307)

## 2021-04-28 ENCOUNTER — Encounter: Payer: Self-pay | Admitting: Hematology

## 2021-04-28 DIAGNOSIS — R404 Transient alteration of awareness: Secondary | ICD-10-CM | POA: Diagnosis not present

## 2021-04-28 DIAGNOSIS — W19XXXA Unspecified fall, initial encounter: Secondary | ICD-10-CM | POA: Diagnosis not present

## 2021-04-28 DIAGNOSIS — S0990XA Unspecified injury of head, initial encounter: Secondary | ICD-10-CM | POA: Diagnosis not present

## 2021-04-28 DIAGNOSIS — G4489 Other headache syndrome: Secondary | ICD-10-CM | POA: Diagnosis not present

## 2021-05-25 DIAGNOSIS — 419620001 Death: Secondary | SNOMED CT | POA: Diagnosis not present

## 2021-05-25 NOTE — Progress Notes (Signed)
HEMATOLOGY/ONCOLOGY CLINIC NOTE  Date of Service: 04/22/2021   Patient Care Team: Carol Ada, MD as PCP - General (Family Medicine)  CHIEF COMPLAINTS/PURPOSE OF CONSULTATION:   Follow-up for continued evaluation and management of low-grade non-Hodgkin's lymphoma  HISTORY OF PRESENTING ILLNESS:  Please see previous note for details on initial presentation  INTERVAL HISTORY:     Sara Butler is here for continued evaluation and management of her low-grade non-Hodgkin's lymphoma . She notes no acute new concerns since her last clinic visit. No acute new focal symptoms.  No new lumps or bumps.  No change in bowel habits or urination. No new skin lesions. Patient notes some fatigue which she attributes to her age.  10 Point review of Systems was done is negative except as noted above. Labs done today were reviewed in detail.   MEDICAL HISTORY:  Past Medical History:  Diagnosis Date   Atrial fibrillation ([place])    Bleeding from anus    Cervical cancer ([place])    Diverticulitis of colon    GERD (gastroesophageal reflux disease)    Gout    Hyperlipidemia    Hypertension    Pulmonary embolism ([facility])   Iron deficiency anemia due to chronic blood loss Paroxysmal atrial fibrillation on Coumadin Diverticulosis of the large and this time Vitamin D deficiency Previous history of pulmonary embolism in 2013 Overactive bladder Osteoarthritis of the knee Chronic back pain History of previous GI bleed in 2011 (while on pradaxa)  SURGICAL HISTORY: Past Surgical History:  Procedure Laterality Date   ABDOMINAL HYSTERECTOMY     BACK SURGERY      SOCIAL HISTORY: Social History   Socioeconomic History   Marital status: Widowed    Spouse name: Not on file   Number of children: 4   Years of education: 93   Highest education level: Not on file  Occupational History   Occupation: Retired Museum/gallery curator at [employer] Use   Smoking status: Former    Packs/day: 0.10     Years: 58.00    Pack years: 5.80    Types: Cigarettes   Smokeless tobacco: Never  Vaping Use   Vaping Use: Never used  Substance and Sexual Activity   Alcohol use: No   Drug use: No   Sexual activity: Never  Other Topics Concern   Not on file  Social History Narrative   Widowed. Lives alone.  Normally independent of ADLs and ambulation.   Social Determinants of Health   Financial Resource Strain: Not on file  Food Insecurity: Not on file  Transportation Needs: Not on file  Physical Activity: Not on file  Stress: Not on file  Social Connections: Not on file  Intimate Partner Violence: Not on file    FAMILY HISTORY: Family History  Problem Relation Age of Onset   Heart failure Mother    Diabetes Brother    Cancer Neg Hx     ALLERGIES:  has No Known Allergies.  MEDICATIONS:  Current Outpatient Medications  Medication Sig Dispense Refill   acetaminophen (TYLENOL) 500 MG tablet Take 500 mg by mouth every 6 (six) hours as needed for headache.     amLODipine (NORVASC) 2.5 MG tablet Take 2.5 mg by mouth daily.     atenolol (TENORMIN) 50 MG tablet Take 50 mg daily by mouth.      diclofenac sodium (VOLTAREN) 1 % GEL Apply 2 g topically daily as needed (pain).      iron polysaccharides (NIFEREX) 150 MG  capsule Take 1 capsule (150 mg total) by mouth daily. 30 capsule 2   rivaroxaban (XARELTO) 10 MG TABS tablet Take 10 mg by mouth daily.     cephALEXin (KEFLEX) 250 MG capsule Take 1 capsule (250 mg total) by mouth 3 (three) times daily. (Patient not taking: Reported on 10/17/2019) 21 capsule 0   Cyanocobalamin (B-12) 1000 MCG SUBL Place 1,000 mcg under the tongue daily. 30 each 3   dexamethasone (DECADRON) 2 MG tablet TAKE 1 TABLET BY MOUTH DAILY WITH BREAKFAST (Patient not taking: Reported on 04/22/2021) 30 tablet 0   estradiol (ESTRACE) 0.1 MG/GM vaginal cream Discard plastic applicator. Insert blueberry size amount of cream on finger in vagina daily x1 week then every other night.      hydrocortisone 2.5 % lotion APPLY TO AREA OF ECZEMATOID RASH AND PRURITIS ON UPPER EXTREMITIES TWICE DAILY 59 mL 1   MYRBETRIQ 25 MG TB24 tablet      oxybutynin (DITROPAN-XL) 10 MG 24 hr tablet  (Patient not taking: Reported on 04/22/2021)     phenazopyridine (PYRIDIUM) 100 MG tablet Take 1 tablet (100 mg total) by mouth 3 (three) times daily as needed for pain. (Patient not taking: Reported on 10/16/2020) 15 tablet 0   potassium chloride SA (K-DUR,KLOR-CON) 20 MEQ tablet Take 1 tablet (20 mEq total) by mouth 2 (two) times daily for 10 days. 20 tablet 0   rivaroxaban (XARELTO) 10 MG TABS tablet Take by mouth. (Patient not taking: Reported on 04/22/2021)     warfarin (COUMADIN) 5 MG tablet Take 5-7.5 mg by mouth daily. Take 5 mg on Monday and Tuesday, all other days take 7.5 mg (Patient not taking: Reported on 10/17/2019)     No current facility-administered medications for this visit.    REVIEW OF SYSTEMS:   10 Point review of Systems was done is negative except as noted above.   PHYSICAL EXAMINATION:  ECOG FS:2 - Symptomatic, <50% confined to bed  Vitals:   04/22/21 0953  BP: (!) 160/90  Pulse: 67  Resp: 18  Temp: 97.8 F (36.6 C)  SpO2: 97%   Wt Readings from Last 3 Encounters:  04/22/21 104 lb 3.2 oz (47.3 kg)  10/16/20 105 lb 6 oz (47.8 kg)  04/18/20 110 lb 1.6 oz (49.9 kg)   Body mass index is 16.82 kg/m.   NAD GENERAL:alert, in no acute distress and comfortable SKIN: no acute rashes, no significant lesions EYES: conjunctiva are pink and non-injected, sclera anicteric OROPHARYNX: MMM, no exudates, no oropharyngeal erythema or ulceration NECK: supple, no JVD LYMPH:  no palpable lymphadenopathy in the cervical, axillary or inguinal regions LUNGS: clear to auscultation b/l with normal respiratory effort HEART: regular rate & rhythm ABDOMEN:  normoactive bowel sounds , non tender, not distended. Extremity: Trace pedal edema PSYCH: alert & oriented x 3 with fluent  speech NEURO: no focal motor/sensory deficits  LABORATORY DATA:  I have reviewed the data as listed  CBC Latest Ref Rng & Units 04/22/2021 10/16/2020 04/18/2020  WBC 4.0 - 10.5 K/uL 4.6 5.6 4.6  Hemoglobin 12.0 - 15.0 g/dL 11.4(L) 11.7(L) 10.9(L)  Hematocrit 36.0 - 46.0 % 36.0 36.8 34.3(L)  Platelets 150 - 400 K/uL 186 146(L) 133(L)    CBC    Component Value Date/Time   WBC 4.6 04/22/2021 0948   WBC 4.6 04/18/2020 1327   RBC 4.27 04/22/2021 0948   HGB 11.4 (L) 04/22/2021 0948   HGB 10.4 (L) 12/02/2016 0900   HCT 36.0 04/22/2021 0948   HCT  32.4 (L) 12/02/2016 0900   PLT 186 04/22/2021 0948   PLT 131 (L) 12/02/2016 0900   MCV 84.3 04/22/2021 0948   MCV 78.8 (L) 12/02/2016 0900   MCH 26.7 04/22/2021 0948   MCHC 31.7 04/22/2021 0948   RDW 15.1 04/22/2021 0948   RDW 19.1 (H) 12/02/2016 0900   LYMPHSABS 1.2 04/22/2021 0948   LYMPHSABS 10.6 (H) 12/02/2016 0900   MONOABS 0.4 04/22/2021 0948   MONOABS 1.0 (H) 12/02/2016 0900   EOSABS 0.1 04/22/2021 0948   EOSABS 0.2 12/02/2016 0900   BASOSABS 0.0 04/22/2021 0948   BASOSABS 0.0 12/02/2016 0900   CMP Latest Ref Rng & Units 04/22/2021 10/16/2020 04/18/2020  Glucose 70 - 99 mg/dL 88 88 90  BUN 8 - 23 mg/dL '20 11 11  '$ Creatinine 0.44 - 1.00 mg/dL 1.10(H) 1.02(H) 1.02(H)  Sodium 135 - 145 mmol/L 141 141 140  Potassium 3.5 - 5.1 mmol/L 3.7 4.3 3.8  Chloride 98 - 111 mmol/L 112(H) 107 109  CO2 22 - 32 mmol/L '24 26 26  '$ Calcium 8.9 - 10.3 mg/dL 10.7(H) 10.2 9.7  Total Protein 6.5 - 8.1 g/dL 8.3(H) 8.0 8.1  Total Bilirubin 0.3 - 1.2 mg/dL 0.6 0.7 0.4  Alkaline Phos 38 - 126 U/L 59 60 55  AST 15 - 41 U/L 12(L) 15 15  ALT 0 - 44 U/L '11 8 9   '$ . Lab Results  Component Value Date   LDH 121 04/22/2021     Lab Results  Component Value Date   FERRITIN 199 04/22/2021        RADIOGRAPHIC STUDIES: I have personally reviewed the radiological images as listed and agreed with the findings in the report.  PET/CT scan  08/18/2016 IMPRESSION: 1. Scattered primarily mild adenopathy most notable in the bilateral inguinal regions, but also with involvement of the left internal mammary chain, left axilla, retroperitoneum, gastrohepatic ligament, and of a pericardial lymph node. Questionable involvement of a small right station 2 lymph node and at the right axilla. Much of this is Deauville 4 disease, with some Deauville 3 and of L2 disease as noted above. 2. Faintly accentuated activity in the spleen without focal activity or overt splenomegaly. This may merit surveillance.  3. There is some Deauville 3 level activity associated with mild soft tissue prominence of the labia majora. This could be simply incidental. 4. Other imaging findings of potential clinical significance: Aortic Atherosclerosis (ICD10-I70.0). Coronary atherosclerosis. Chronic left occipital lobe encephalomalacia. Hepatic cysts and potential scarring in the right hepatic lobe. Pancreas divisum. Chronic degenerative anterolisthesis at L4-5. Bony demineralization.     Electronically Signed   By: Van Clines M.D.   On: 08/18/2016 13:09    ASSESSMENT & PLAN:   88 y.o. female with multiple medical comorbidities with  #1 Low grade Non Hodgkins B cell lymphoma NOS  Flow cytometry suggestive of Cd20+ CD5-CD 10 neg Low grade Non Hodgkins lymphoma. Concern for possible CD5 neg chronic lymphocytic leukemia versus low grade non-Hodgkin's lymphoma NOS  Patient did have some borderline abdominal and inguinal lymphadenopathy on her CT abdomen pelvis in November 2017.  PET/CT 6/25 showed Scattered primarily mild adenopathy most notable in the bilateral inguinal regions, but also with involvement of the left internal mammary chain, left axilla, retroperitoneum, gastrohepatic ligament, and of a pericardial lymph node. Questionable involvement of a small right station 2 lymph node and at the right axilla. Much of this is Deauville 4 disease, with some  Deauville 3 and of L2 disease as noted  above. 2. Faintly accentuated activity in the spleen without focal activity or overt splenomegaly.  CLL FISH Prognostic panel -- did not show any mutation typical for CLL. Less likely CD5 neg CLL US guided Bx of inguinal LN was done- however only limited lymphoid tissue was obtained and the biopsy is nondiagnostic. Patient had previously decided to hold off  On an excisional lymph node biopsy  #2 Anemia  - microcytic anemia with some element to iron deficiency.  Iron /ferritin levels much improved after IV iron . Lab Results  Component Value Date   FERRITIN 199 04/22/2021    #3 B12 deficiency. B12 levels are coming up from 116 now up to 295 to 722 with oral B12 replacement. No evidence of pernicious anemia based on neg antibody testing. B12 - 705  #4 Mild thrombocytopenia-  could be related to her NHL platelets now are normal at 186k  #5 Weight Loss Wt Readings from Last 3 Encounters:  04/22/21 104 lb 3.2 oz (47.3 kg)  10/16/20 105 lb 6 oz (47.8 kg)  04/18/20 110 lb 1.6 oz (49.9 kg)   PLAN:  Patient notes no overt new symptoms suggestive of progression of her low-grade non-Hodgkin's lymphoma Labs today show stable hemoglobin of 11.4 with normal WBC count and platelets CMP stable . Lab Results  Component Value Date   LDH 121 04/22/2021   LDH within normal limits at 121 Patient has no clinical or lab evidence of significant non-Hodgkin's lymphoma progression at this time. No lab or clinical changes suggestive of need for non-Hodgkin's lymphoma treatment at this time. -With her age and medical comorbidities we prefer to take a very conservative treatment approach overall.  FOLLOW UP: RTC with Dr Irene Limbo with labs in 6 months  The total time spent in the appointment was 20 minutes*.  All of the patient's questions were answered with apparent satisfaction. The patient knows to call the clinic with any problems, questions or  concerns.   Sullivan Lone MD MS AAHIVMS [facility] Hematology/Oncology Physician [facility]  .*Total Encounter Time as defined by the Centers for Medicare and Medicaid Services includes, in addition to the face-to-face time of a patient visit (documented in the note above) non-face-to-face time: obtaining and reviewing outside history, ordering and reviewing medications, tests or procedures, care coordination (communications with other health care professionals or caregivers) and documentation in the medical record.

## 2021-05-25 DEATH — deceased
